# Patient Record
Sex: Female | Born: 1955 | Race: White | Hispanic: No | Marital: Married | Smoking: Current every day smoker
Health system: Southern US, Community
[De-identification: ages and names within clinical notes are randomized; demographics above are authoritative.]

## PROBLEM LIST (undated history)

## (undated) DIAGNOSIS — R0989 Other specified symptoms and signs involving the circulatory and respiratory systems: Secondary | ICD-10-CM

## (undated) DIAGNOSIS — J189 Pneumonia, unspecified organism: Secondary | ICD-10-CM

## (undated) DIAGNOSIS — R519 Headache, unspecified: Secondary | ICD-10-CM

## (undated) DIAGNOSIS — G43909 Migraine, unspecified, not intractable, without status migrainosus: Secondary | ICD-10-CM

## (undated) DIAGNOSIS — I779 Disorder of arteries and arterioles, unspecified: Secondary | ICD-10-CM

## (undated) DIAGNOSIS — R51 Headache: Secondary | ICD-10-CM

## (undated) DIAGNOSIS — I739 Peripheral vascular disease, unspecified: Secondary | ICD-10-CM

## (undated) DIAGNOSIS — I639 Cerebral infarction, unspecified: Secondary | ICD-10-CM

## (undated) DIAGNOSIS — M5030 Other cervical disc degeneration, unspecified cervical region: Secondary | ICD-10-CM

## (undated) DIAGNOSIS — R42 Dizziness and giddiness: Secondary | ICD-10-CM

## (undated) DIAGNOSIS — I10 Essential (primary) hypertension: Secondary | ICD-10-CM

## (undated) DIAGNOSIS — I259 Chronic ischemic heart disease, unspecified: Secondary | ICD-10-CM

## (undated) DIAGNOSIS — E785 Hyperlipidemia, unspecified: Secondary | ICD-10-CM

## (undated) DIAGNOSIS — I2510 Atherosclerotic heart disease of native coronary artery without angina pectoris: Secondary | ICD-10-CM

## (undated) DIAGNOSIS — J40 Bronchitis, not specified as acute or chronic: Secondary | ICD-10-CM

## (undated) HISTORY — DX: Essential (primary) hypertension: I10

## (undated) HISTORY — DX: Hyperlipidemia, unspecified: E78.5

## (undated) HISTORY — DX: Atherosclerotic heart disease of native coronary artery without angina pectoris: I25.10

## (undated) HISTORY — DX: Headache, unspecified: R51.9

## (undated) HISTORY — DX: Peripheral vascular disease, unspecified: I73.9

## (undated) HISTORY — DX: Pneumonia, unspecified organism: J18.9

## (undated) HISTORY — DX: Dizziness and giddiness: R42

## (undated) HISTORY — DX: Chronic ischemic heart disease, unspecified: I25.9

## (undated) HISTORY — DX: Cerebral infarction, unspecified: I63.9

## (undated) HISTORY — DX: Disorder of arteries and arterioles, unspecified: I77.9

## (undated) HISTORY — DX: Bronchitis, not specified as acute or chronic: J40

## (undated) HISTORY — DX: Other specified symptoms and signs involving the circulatory and respiratory systems: R09.89

## (undated) HISTORY — DX: Headache: R51

## (undated) HISTORY — DX: Other cervical disc degeneration, unspecified cervical region: M50.30

---

## 1999-08-18 ENCOUNTER — Encounter: Admission: RE | Admit: 1999-08-18 | Discharge: 1999-11-16 | Payer: Self-pay | Admitting: Family Medicine

## 2000-09-02 HISTORY — PX: CATARACT EXTRACTION: SUR2

## 2001-01-02 DIAGNOSIS — I779 Disorder of arteries and arterioles, unspecified: Secondary | ICD-10-CM

## 2001-01-02 HISTORY — DX: Disorder of arteries and arterioles, unspecified: I77.9

## 2001-01-02 HISTORY — PX: PERIPHERAL VASCULAR CATHETERIZATION: SHX172C

## 2001-01-24 ENCOUNTER — Ambulatory Visit: Admission: RE | Admit: 2001-01-24 | Discharge: 2001-01-24 | Payer: Self-pay | Admitting: Vascular Surgery

## 2001-01-24 ENCOUNTER — Encounter: Payer: Self-pay | Admitting: Vascular Surgery

## 2001-02-02 HISTORY — PX: AORTO-FEMORAL BYPASS GRAFT: SHX885

## 2001-02-10 ENCOUNTER — Inpatient Hospital Stay (HOSPITAL_COMMUNITY): Admission: RE | Admit: 2001-02-10 | Discharge: 2001-02-15 | Payer: Self-pay | Admitting: Vascular Surgery

## 2001-02-10 ENCOUNTER — Encounter (INDEPENDENT_AMBULATORY_CARE_PROVIDER_SITE_OTHER): Payer: Self-pay | Admitting: *Deleted

## 2001-02-10 ENCOUNTER — Encounter: Payer: Self-pay | Admitting: Vascular Surgery

## 2001-02-11 ENCOUNTER — Encounter: Payer: Self-pay | Admitting: Vascular Surgery

## 2001-04-04 DIAGNOSIS — I2510 Atherosclerotic heart disease of native coronary artery without angina pectoris: Secondary | ICD-10-CM

## 2001-04-04 HISTORY — DX: Atherosclerotic heart disease of native coronary artery without angina pectoris: I25.10

## 2001-04-28 ENCOUNTER — Ambulatory Visit (HOSPITAL_COMMUNITY): Admission: RE | Admit: 2001-04-28 | Discharge: 2001-04-28 | Payer: Self-pay | Admitting: Cardiovascular Disease

## 2001-04-28 HISTORY — PX: CARDIAC CATHETERIZATION: SHX172

## 2001-04-28 HISTORY — PX: LEFT HEART CATH AND CORONARY ANGIOGRAPHY: CATH118249

## 2001-05-02 ENCOUNTER — Encounter: Payer: Self-pay | Admitting: Thoracic Surgery (Cardiothoracic Vascular Surgery)

## 2001-05-03 ENCOUNTER — Encounter: Payer: Self-pay | Admitting: Thoracic Surgery (Cardiothoracic Vascular Surgery)

## 2001-05-03 ENCOUNTER — Inpatient Hospital Stay (HOSPITAL_COMMUNITY)
Admission: RE | Admit: 2001-05-03 | Discharge: 2001-05-07 | Payer: Self-pay | Admitting: Thoracic Surgery (Cardiothoracic Vascular Surgery)

## 2001-05-03 HISTORY — PX: CORONARY ARTERY BYPASS GRAFT: SHX141

## 2001-05-04 ENCOUNTER — Encounter: Payer: Self-pay | Admitting: Thoracic Surgery (Cardiothoracic Vascular Surgery)

## 2001-05-05 ENCOUNTER — Encounter: Payer: Self-pay | Admitting: Thoracic Surgery (Cardiothoracic Vascular Surgery)

## 2001-07-05 HISTORY — PX: CAROTID-SUBCLAVIAN BYPASS GRAFT: SHX910

## 2001-07-11 ENCOUNTER — Encounter (HOSPITAL_COMMUNITY): Admission: RE | Admit: 2001-07-11 | Discharge: 2001-08-18 | Payer: Self-pay | Admitting: Family Medicine

## 2001-08-05 HISTORY — PX: CAROTID-SUBCLAVIAN BYPASS GRAFT: SHX910

## 2001-08-15 ENCOUNTER — Encounter: Payer: Self-pay | Admitting: Vascular Surgery

## 2001-08-16 ENCOUNTER — Encounter (INDEPENDENT_AMBULATORY_CARE_PROVIDER_SITE_OTHER): Payer: Self-pay

## 2001-08-16 ENCOUNTER — Inpatient Hospital Stay (HOSPITAL_COMMUNITY): Admission: RE | Admit: 2001-08-16 | Discharge: 2001-08-17 | Payer: Self-pay | Admitting: Vascular Surgery

## 2001-09-19 ENCOUNTER — Encounter: Payer: Self-pay | Admitting: Family Medicine

## 2001-09-19 ENCOUNTER — Ambulatory Visit (HOSPITAL_COMMUNITY): Admission: RE | Admit: 2001-09-19 | Discharge: 2001-09-19 | Payer: Self-pay | Admitting: Family Medicine

## 2004-07-16 HISTORY — PX: US ECHOCARDIOGRAPHY: HXRAD669

## 2006-11-23 HISTORY — PX: CARDIOVASCULAR STRESS TEST: SHX262

## 2008-06-10 ENCOUNTER — Ambulatory Visit: Payer: Self-pay | Admitting: Vascular Surgery

## 2010-04-21 ENCOUNTER — Ambulatory Visit: Payer: Self-pay | Admitting: Cardiology

## 2010-06-23 ENCOUNTER — Ambulatory Visit: Payer: Self-pay | Admitting: Vascular Surgery

## 2010-08-20 ENCOUNTER — Ambulatory Visit (INDEPENDENT_AMBULATORY_CARE_PROVIDER_SITE_OTHER): Payer: Medicare Other | Admitting: Cardiology

## 2010-08-20 DIAGNOSIS — I119 Hypertensive heart disease without heart failure: Secondary | ICD-10-CM

## 2010-08-20 DIAGNOSIS — E7800 Pure hypercholesterolemia, unspecified: Secondary | ICD-10-CM

## 2010-08-20 DIAGNOSIS — E119 Type 2 diabetes mellitus without complications: Secondary | ICD-10-CM

## 2010-11-17 NOTE — Procedures (Signed)
CAROTID DUPLEX EXAM   INDICATION:  Follow-up carotid artery disease.   HISTORY:  Diabetes:  No.  Cardiac:  CABG.  Hypertension:  Yes.  Smoking:  Previous.  Previous Surgery:  Left carotid-to-subclavian artery anastomosis with  subclavian artery endarterectomy on 08/16/2001, AFBG in 2002.  CV History:  Asymptomatic.  Amaurosis Fugax No, Paresthesias No, Hemiparesis No.                                       RIGHT             LEFT  Brachial systolic pressure:         142               148  Brachial Doppler waveforms:         Normal            Normal  Vertebral direction of flow:        Antegrade         Antegrade  DUPLEX VELOCITIES (cm/sec)  CCA peak systolic                   103               116  ECA peak systolic                   151               125  ICA peak systolic                   212               67  ICA end diastolic                   71                27  PLAQUE MORPHOLOGY:                  Mixed             Mixed  PLAQUE AMOUNT:                      Moderate          Mild  PLAQUE LOCATION:                    ICA               ICA/CCA   IMPRESSION:  1. 60-79% stenosis of the right internal carotid artery.  2. 1-39% stenosis of the left internal carotid artery.  3. Patent left common carotid to subclavian artery anastomosis site      with velocity of 214 cm/s noted.   No significant change noted when compared to the previous examination on  05/03/2006.   ___________________________________________  Quita Skye. Hart Rochester, M.D.   CH/MEDQ  D:  06/10/2008  T:  06/10/2008  Job:  [id number]

## 2010-11-17 NOTE — Consult Note (Signed)
NEW PATIENT CONSULTATION   Danielle Farrell, Danielle Farrell  DOB:  Nov 06, 1955                                       06/23/2010  OZHYQ#:[id number]   Patient is a 55 year old female with a previous history of diffuse  vascular disease.  She underwent a left carotid subclavian anastomosis  by me in 2003 for left arm claudication and has undergone aortobifemoral  bypass grafting by me for lower extremity claudication.  Today she  presents with some presyncopal-type spells which always occur when she  arises quickly.  In the past this has been due to her lisinopril, and  the dose has been cut in half.  She denies any hemispheric or  nonhemispheric TIAs, amaurosis fugax, diplopia, blurred vision, or true  syncope.  She does not ambulate long distances but does not have  claudication symptoms.   CHRONIC MEDICAL PROBLEMS:  1. Type 2 diabetes mellitus.  2. Hypertension.  3. Hyperlipidemia.  4. Coronary artery disease, previous coronary bypass grafting in 2002.  5. Negative for COPD or stroke.   SOCIAL HISTORY:  She is married, does not have children.  Quit smoking  in 2003.  Does not use alcohol.   FAMILY HISTORY:  Positive for coronary artery disease in her brother who  had a myocardial infarction at age 60.  Positive for diabetes in  grandparents.  Negative for stroke.   REVIEW OF SYSTEMS:  Positive for the dizziness, presyncopal-type spells.  Occasionally has some right posterior shoulder discomfort.  She has  memory problems at times.  Denies any other symptoms in the complete  review of systems.   PHYSICAL EXAMINATION:  Blood pressure 115/69 in both upper extremities,  heart rate 54, respirations 18.  General:  She is a well-developed and  well-nourished female in no apparent distress, alert and oriented x3.  HEENT:  Normal for age.  EOMs intact.  Lungs:  Clear to auscultation.  No rhonchi or wheezing.  Cardiovascular:  Regular rhythm.  No murmurs.  Carotid pulses are 3+.   There is a soft bruit on the right.  Abdomen:  Soft, nontender with no masses.  Musculoskeletal:  Free of major  deformities.  Neurologic:  Normal.  Skin:  Free of rashes.  Lower  extremity exam reveals 3+ femoral, 2+ popliteal, and 2+ posterior tibial  pulses bilaterally.   Today I ordered a carotid duplex exam which I have reviewed and  interpreted.  She has had some mild progression of her disease in the  right internal carotid, which now approximates 70% in severity.  Her  left carotid subclavian anastomosis was widely patent.   She remains asymptomatic but does have a moderate stenosis in her right  internal carotid, which we will continue to follow closely.  If she has  any specific neurologic symptoms, she will be in touch with [place],  otherwise we will see her in 6 months for follow-up carotid duplex exam.     Quita Skye. Hart Rochester, M.D.  Electronically Signed   JDL/MEDQ  D:  06/23/2010  T:  06/23/2010  Job:  [id number]   cc:   [name], M.D.  Cassell Clement, M.D.

## 2010-11-17 NOTE — Procedures (Signed)
CAROTID DUPLEX EXAM   INDICATION:  Followup carotid disease.   HISTORY:  Left subclavian transposed to left CCA in 2003.  Diabetes:  No.  Cardiac:  CABG 2002.  Hypertension:  Yes.  Smoking:  Previous.  Previous Surgery:  Aortobifemoral graft 2002.  CV History:  Amaurosis Fugax No, Paresthesias No, Hemiparesis No                                       RIGHT             LEFT  Brachial systolic pressure:         150               144  Brachial Doppler waveforms:         WNL               WNL  Vertebral direction of flow:        Antegrade         Antegrade  DUPLEX VELOCITIES (cm/sec)  CCA peak systolic                   97                137  ECA peak systolic                   182               95  ICA peak systolic                   150               178  ICA end diastolic                   82                34  PLAQUE MORPHOLOGY:                  Heterogeneous     Heterogeneous  PLAQUE AMOUNT:                      Moderate          Mild  PLAQUE LOCATION:                    CCA/ICA           CCA/ICA   IMPRESSION:  1. Stable 60%-79% right internal carotid artery stenosis.  2. Patent anastomosis of transposed left subclavian artery with      elevated velocities in proximal common carotid artery.  3. 1%-39% left internal carotid artery plaquing.  4. Antegrade bilaterally vertebral arteries.  5. Mild plaquing of the mid left common carotid artery.  6. No change from previous report.   ___________________________________________  Quita Skye. Hart Rochester, M.D.   LT/MEDQ  D:  06/23/2010  T:  06/23/2010  Job:  [id number]

## 2010-11-20 NOTE — Op Note (Signed)
[place]. [facility]  Patient:    Danielle Farrell, Danielle Farrell                         MRN: [id number] Proc. Date: 02/10/01 Attending:  Quita Skye. Hart Rochester, M.D.                           Operative Report  PREOPERATIVE DIAGNOSIS:  Severe aortoiliac occlusive disease with limiting claudication and renal vascular hypertension with severe left renal artery stenosis.  POSTOPERATIVE DIAGNOSIS:  Severe aortoiliac occlusive disease with limiting claudication and renal vascular hypertension with severe left renal artery stenosis.  OPERATION: 1. Aortobifemoral bypass grafting using a 12 x 7 mm Hemashield Dacron graft. 2. Aorta to left renal artery bypass (end-to-side) using a 6 mm Hemashield    dacron graft.  SURGEON:  Dr. Hart Rochester.  FIRST ASSISTANT:  Dr. Cari Caraway.  SECOND ASSISTANT:  Maxwell Marion, RNFA  ANESTHESIA:  General endotracheal.  DESCRIPTION OF PROCEDURE:  The patient was taken to the operating room, placed in a supine position, at which time satisfactory general endotracheal anesthesia was administered.  Abdomen and groins were prepped with Betadine scrubbing solution, draped in routine sterile manner.  Longitudinal incisions were made in both inguinal regions, carried down through subcutaneous tissue, and the common, superficial, and profundus femoris arteries were dissected free and circled with vessiloops.  The right common femoral artery had a 2 to 3+ pulse, left femoral artery had an absent pulse.  There was posterior plaquing in the external iliac arteries beneath the inguinal ligaments.  A midline incision was made from xiphoid to just below the umbilicus, carried down through the subcutaneous tissue and linea alba and bovied.  The peritoneal cavity was entered and thoroughly explored.  The stomach, duodenum, small bowel, and colon were unremarkable.  The liver was smooth with no palpable masses.  Gallbladder appeared normal.  No stones were palpable.   The uterus and adnexa appeared normal.  Transverse colon was elevated, intestines were reflected to the right side, retroperitoneum incised exposing the aorta from the renal arteries to the bifurcation.  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  Circumferential control of the aorta was obtained.  The left renal artery was dissected free out to its primary and secondary branches for a severe atherosclerotic lesion in its mid portion.  Retroperitoneal tunnels were created posterior to the ureters.  The patient was given 25 g of Mannitol, and then heparinized.  The aorta was occluded, therefore, the renal artery was transected.  The distal stump oversewn with two layers of 3-0 Prolene.  The proximal aorta did not require an endarterectomy, although it was of small caliber.  A 12 x 7 mm Hemashield dacron graft was anastomosed end-to-end using continuous 3-0 Prolene, buttressing this with a strip of felt.  There were no leaks present.  Limbs were delivered through the tunnel, and end-to-side anastomoses were done to the common femoral arteries bilaterally with 5-0 Prolene after beveling the grafts appropriately.  Right leg was opened initially followed by the left leg with no significant hypotension.  Attention was returned to the abdomen where the left renal artery was re-exposed, left renal vein having been mobilized for exposure. The left renal artery was occluded out into the primary branches with vascular clamps.  A longitudinal opening  made in the distal renal artery and extended out the superior branch, which was the larger of the two, where there was no evidence of atherosclerosis.  There was very sluggish flow coming from the proximal renal artery.  A 6 mm Dacron graft spatulated appropriately and anastomosed end-to-side using continuous  6-0 Prolene.  Clamps were then released after about 25 minutes of ischemia time.  The bypass graft was then anastomosed to the anterior aspect of the aortic graft after opening it with an 11 blade, enlarging it with a 5 mm punch, and this was done with 5-0 Prolene in an end-to-side fashion.  When this was completed, the aortic graft reopened with no significant hypotension, an excellent flow in all vessels, and good Doppler flow in the renal artery.  Prolene was given to reverse the heparin.  Following adequate hemostasis, the inguinal incision was closed with Vicryl in a subcuticular fashion with Steri-Strips.  The retroperitoneum closed with 3-0 chromic.  Linea alba with #1 Prolene.  Skin in the midline with clips.  Sterile dressing applied, and the patient was taken to the recovery room in satisfactory condition.  She received no blood from the blood bank, was stable hemodynamically, and had an excellent urinary output. DD:  02/10/01 TD:  02/10/01 Job: [id number]

## 2010-11-20 NOTE — H&P (Signed)
[place]. [facility]  Patient:    Danielle Farrell, Danielle Farrell Visit Number: [id number] MRN: [id number]          Service Type: SUR Location: 2000 2015 01 Attending Physician:  Tressie Stalker Dictated by:   Loura Pardon, P.A. Admit Date:  05/03/2001 Discharge Date: 05/07/2001   CC:         Ricka Burdock, M.D.  Thomas A. Patty Sermons, M.D. Midge Aver H. Cornelius Moras, M.D.   History and Physical  HISTORY OF PRESENT ILLNESS:  Miss Witkop is 55 year old female referred by Dr. Jeannetta Nap for evaluation of left upper extremity claudication. She has the feeling that her left arm is numb, "a dead weight" with exertion such as making a bed or shaking out a rug. 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 She presents for a carotid subclavian surgery on August 16, 2001, by Dr. Hart Rochester. She denies any prior history of dizziness, vertigo, seizures, or tingling in the right upper extremity. She does complain of muscle weakness in the left arm, tingling and numbness in the left arm. She does not have any memory loss or confusion.  PAST MEDICAL HISTORY: 1. 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 2. Left subclavian stent. 3. History of claudication secondary to aortoiliac occlusive disease. 4. Hypercholesterolemia. 5. Hypertriglyceridemia. 6. Type 2 diabetes diagnosed December 2000, diet controlled. She stopped drinking two liters of Dr. Reino Kent a day.  PAST SURGICAL HISTORY: 1. Aortobifemoral bypass with left renal artery bypass, Dr. Josephina Gip August 2002. 2. Status post coronary artery bypass, last surgery October 2002. 3. Status post left cataract removal. 4. Status post wisdom teeth removal x four. 5. Status post herniorrhaphy.  MEDICATIONS: 1.  Ecotrin 325 mg daily. 2. Lipitor 80 mg at bedtime. 3. Paxil 20 mg daily. 4. Lopressor 50 mg 1/2 tab in the morning, 1/2 tab in the evening. 5. Unisom 1 tablet at bedtime for insomnia. 6. Multi vitamin alternating with iron tablets every other day.  ALLERGIES:  Nausea with Erythromycin and with Darvocet.  SOCIAL HISTORY:  She is married for the last 28 years. Has no children. 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 She does not drink alcoholic beverages. She quit smoking in October 2002 but before quitting she smoked one pack per day for 30 years.  FAMILY HISTORY:  Mother died at age 109 of cancer. Father died at age 82 of cancer. She has a brother who had a myocardial infarction in December of 2000.   PHYSICAL EXAMINATION:  GENERAL APPEARANCE:  This is an alert, oriented, cheerful white female in no acute distress. She does favor her left upper extremity in motion.  VITAL SIGNS:  Blood pressure on the right upper extremity is 152/88, pulse on the right is 56, uneven, respiration rate is 20. The left upper extremity there is no palpable radial or ulnar pulses. HEENT:  She is normocephalic. She has native dentitions in excellent repair. Eyes:  Pupils are equal, round, reactive to light. Extraocular movements are intact. She does have cataract of the left eye which has been removed. No history of glaucoma or macular degeneration.  NECK:  Supple. There is no jugular venous distention. No carotid bruits are auscultated.  CHEST:  Symmetrical on inspiration.  LUNGS:  Without  wheezes, rhonchi, or rales.  CARDIAC:  Regular rate and rhythm without rubs or gallops.  ABDOMEN:  Soft, nondistended. Bowel sounds are present throughout. There are no masses palpated. No bruits auscultated.  EXTREMITIES:  Without clubbing or cyanosis. No peripheral edema noted. Left upper extremity is cool. Once again, absent radial and left ulnar pulses. She has palpable pedal pulses  bilaterally.  NEUROLOGIC:  Nonfocal. Gait is steady. Patellar reflexes 2+ bilaterally.  IMPRESSION:  Left subclavian artery, total occlusion, with steel, retrograde flow in the left vertebral artery.  PLAN:  Left carotid to subclavian bypass, Dr. Josephina Gip, June 15, 2002. Dictated by:   Loura Pardon, P.A. Attending Physician:  Tressie Stalker DD:  08/15/01 TD:  08/15/01 Job: [id number]

## 2010-11-20 NOTE — Cardiovascular Report (Signed)
[place]. [facility]  Patient:    Danielle Farrell, Danielle Farrell Visit Number: [id number] MRN: [id number]          Service Type: CAT Location: [facility] 2899 11 Attending Physician:  Koren Bound Dictated by:   Alvia Grove., M.D. Proc. Date: 04/28/01 Admit Date:  04/28/2001 Discharge Date: 04/28/2001   CC:         Cardiac Catheterization Laboratory  [name], M.D.  Alfredia Ferguson, M.D.  Thomas A. Patty Sermons, M.D.  Quita Skye Hart Rochester, M.D.   Cardiac Catheterization  INDICATIONS: The patient is a 55 year old with a history of severe peripheral vascular disease, angina, and an abnormal Cardiolite study revealing significant anterior wall ischemia. She presents for heart catheterization for further evaluation. She has been having worsening angina symptoms for the past 3 or 4 weeks. These symptoms are significantly controlled with nitroglycerin although she has angina with very little exertion.  PROCEDURES: Left heart catheterization with coronary angiography.  DESCRIPTION OF PROCEDURE: The right femoral artery was easily cannulated using the modified Seldinger technique.  HEMODYNAMICS: The left ventricular pressure is 131/12, the aortic pressure is 131/73.  ANGIOGRAPHY: The proximal left main is large and normal. The distal left main tapers to approximately a 50-60% stenosis. This stenosis extends into the LAD and the left circumflex artery.  The left anterior descending artery has a significant eccentric 80-90% stenosis in the very most proximal segment/origin. This segment was not originally seen on original angiograms, but later angiograms revealed the stenosis fairly well.  There is a mid 50-60% stenosis followed by a 30% stenosis. The remainder of the LAD has minor luminal irregularities.  The left circumflex artery has a 50% stenosis at its origin. The stenosis is part of the left main stenosis. The remainder of the left circumflex  artery supplies a first and second obtuse marginal distribution.  The right coronary artery is a dominant vessel. There is a proximal stenosis of approximately 50%. There was some damping of the 6 [place] catheter. This stenosis appears to be somewhat fusiform and is relatively smooth.  There are minor luminal irregularities in the distal aspect of the right coronary artery particularly in the posterior descending artery. These stenosis are on only 10-20% in severity. The posterolateral segment artery is unremarkable.  LEFT VENTRICULOGRAM: The left ventriculogram was performed in a 30 RAO position. It revealed normal left ventricular systolic function. There is no mitral regurgitation.  We originally setup for PTCA. The left main was engaged using a 7 [place] catheter. When we performed the routine angiograms, it was then that we found the very tight proximal LAD stenosis. We gave intracoronary nitroglycerin and the stenosis only improved slightly. It appears that this stenosis is quite eccentric and is really seen in its most severe form at specific angles. At this point, we aborted the angioplasty and have decided to refer the patient for coronary artery bypass grafting.  COMPLICATIONS: None.  CONCLUSIONS: Significant coronary artery disease involving the left main, left anterior descending, and to a moderate degree the left circumflex artery. She has mild to moderate irregularities in the right coronary artery.  It is not clear whether the right coronary lesion is obstructive. It does appear to be 50% in severity although it is relatively smooth. We will review the case with Dr. Laneta Simmers and will decide whether or not to graft the right coronary artery. She has normal left ventricular systolic function. Dictated by:   Alvia Grove., M.D. Attending Physician:  Nahser, Daune Perch DD:  04/28/01 TD:  05/01/01 Job: [id number]

## 2010-11-20 NOTE — H&P (Signed)
[place]. [facility]  Patient:    Danielle Farrell, Danielle Farrell                          MRN: [id number] Adm. Date:  [id number] Attending:  Colvin Caroli Dictator:   Durenda Age, P.A.-C. CC:         Buren Kos, M.D.   History and Physical  DATE OF BIRTH:  1955/08/13  CHIEF COMPLAINT:  AIOD, renal artery stenosis.  HISTORY OF PRESENT ILLNESS:  Ms. Guilfoil is a pleasant 55 year old white female referred by Dr. Buren Kos regarding her AIOD.  The patient began experiencing lower extremity claudication with left hip joint discomfort causing the leg to "get heavy" after walking 100 feet or about 5 to 10 minutes, causing her to stop.  In the right leg, she has no claudication symptoms.  Lower extremity Dopplers on January 17, 2001 revealed a left ABI of 0.56% and a right ABI of 96%, consistent with left iliac occlusive disease. 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  Based on the findings, as well as the symptoms, Dr. Quita Skye. Hart Rochester recommended to proceed with left renal artery bypass graft, aortofemoral bypass graft, which are scheduled for February 10, 2001.  She experiences mild left buttock pain, as well as hip and thigh pain, as above.  No calf pain.  The pain is not present at rest or wakes the patient up at night.  No visible ulcers or gangrenous changes.  No ischemic changes.  No peripheral edema.  No decrease in temperature.  She does experience some shortness of breath on exertion, after walking one block, but no shortness of breath at rest.  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  PAST SURGICAL HISTORY:  Status post angiogram, January 26, 2001; status post cataract surgery, 2002; status post herniorrhaphy in 1992.  MEDICATIONS: 1. Enalapril 10 mg q.d. 2. Lipitor 20 mg q.d. 3. Paxil 20 mg q.d.  ALLERGIES:  ZANTAC VERSUS XANAX -- the patient cannot recall which one was the cause of angioedema, for they were taken at the same time; ERYTHROMYCIN.  REVIEW OF SYSTEMS:  See HPI and past medical history for significant positives.  No kidney disease or asthma; however, on August 1st, the patient experienced one episode of substernal chest pain accompanied by palpitations and diaphoresis after a stressful situation she experienced.  This had no recurrence.  Moreover, she also has experienced mild dizziness, left upper extremity weakness, occasional numbness and tingling in the left hand, and bilateral eye blurriness with no syncope or presyncope.  PAST FAMILY MEDICAL HISTORY:  Mother died at 35 of uterine cancer; history of heart disease.  Father died at 73 of prostate cancer; history of heart disease.  One brother died of MI at age 78 while driving in the year 5621.  SOCIAL HISTORY:  Widowed.  No children.  She is a Psychologist, forensic.  She smokes 1 pack a day of cigarettes for the last 30 years, although she has plans to quit on February 10, 2001.  She drinks two beers a day.  PHYSICAL EXAMINATION:  GENERAL:  Well-developed, well-nourished 55 year old white female in no acute distress, alert and oriented x 3, somewhat anxious.  VITAL SIGNS:  Blood pressure 120/60 on the right; on the left, her blood pressure was not attainable.  Pulse 68.  Respirations 18.  HEENT:  Normocephalic, atraumatic.  PERRLA.  EOMI.  Left cataract.  NECK:  Supple.  No JVD.  Right bruit.  No lymphadenopathy.  CHEST:  Symmetrical on inspiration.  LUNGS:  No wheezes.  Trace of rhonchi in the left lower lobe.  No  rales or lymphadenopathy.  CARDIOVASCULAR:  Regular rate and rhythm, 1/6 systolic murmur in the right sternal border, with an occasional ectopic beat.  No rubs.  No gallops.  ABDOMEN:  Soft, nontender.  Bowel sounds x 4.  No masses.  There is a bruit towards the left upper quadrant.  GU:  Deferred.  RECTAL:  Deferred.  EXTREMITIES:  There is mild clubbing in all her fingers.  No cyanosis or edema.  No ulcerations.  Warm temperature.  PERIPHERAL PULSES:  Carotids 2+ bilaterally.  Femorals:  Cannot palpate the left femoral; on the right, 2+ with a small bruit.  Popliteal, dorsalis pedis and posterior tibialis nonpalpable on the left; on the right, 2+.  NEUROLOGIC:  Nonfocal.  Gait steady.  DTRs 2+ bilaterally.  Muscle strength 5/5.  ASSESSMENT AND PLAN:  Aortoiliac occlusive disease -- renal artery stenosis, for aortofemoral vein graft, left renal artery bypass graft on February 10, 2001 by Dr. Hart Rochester.  Dr. Hart Rochester has seen and evaluated this patient prior to the admission and has explained the risks and benefits involved in the procedure and the patient has agreed to continue. DD:  02/08/01 TD:  02/08/01 Job: [id number]

## 2010-11-20 NOTE — Discharge Summary (Signed)
[place]. [facility]  Patient:    Danielle Farrell, Danielle Farrell                          MRN: [id number] Adm. Date:  [id number] Disc. Date: 02/15/01 Attending:  Colvin Caroli CC:         Dr. Jeannetta Nap   Discharge Summary  DATE OF BIRTH:  1956/04/06  ADDENDUM:  Please note the above-listed medications included her preadmission verapamil, Lipitor, and Paxil, along with Niferex, Colace, and Tylox.  Upon reviewing her medicines, she was started on Lopressor postoperatively, and her blood pressure is well controlled and is currently in the range of 100 to as high as 150 systolic, with a diastolic pressure in the 70-80 range.  She has not been started back on the verapamil and, therefore, we are going to send her home on the Lopressor 50 mg 1/2 tablet q.12h. and will allow Dr. Jeannetta Nap to add or revise that at his discretion at a later time.  In addition to the Lopressor, we are going to send her home on a multivitamin with iron 1 daily, aspirin 325 mg 1 q.d., and we recommend her to discontinue smoking. DD:  02/15/01 TD:  02/15/01 Job: [id number]

## 2010-11-20 NOTE — Discharge Summary (Signed)
[place]. [facility]  Patient:    Danielle Farrell, Danielle Farrell                          MRN: [id number] Adm. Date:  [id number] Disc. Date: 02/15/01 Attending:  Colvin Caroli Dictator:   Dominica Severin, P.A. CC:         Buren Kos, M.D.   Discharge Summary  DATE OF BIRTH:  02/20/1956  PRIMARY DIAGNOSIS:  Aortoiliac occlusive disease and renal artery stenosis.  SECONDARY DIAGNOSES/PAST MEDICAL HISTORY: 1. Peripheral vascular occlusive disease. 2. Non-insulin-dependent diabetes mellitus which is diet controlled. 3. History of tobacco abuse. 4. Hypertension. 5. Moderate right internal carotid artery stenosis. 6. Left subclavian steal with left arm claudication. 7. Anxiety. 8. Hyperlipidemia. 9. Recent history of upper respiratory tract infection.  NEW DIAGNOSES/DISCHARGE DIAGNOSES:  Status post aortobifemoral bypass and aorta to left renal artery bypass.  PROCEDURES:  Aortobifemoral bypass with a 12 x 7 mm Hemashield graft and aorta to left renal artery bypass with 6 mm Hemasheild graft done on February 10, 2001, by Quita Skye. Hart Rochester, M.D.  HISTORY OF PRESENT ILLNESS:  This patient is a 55 year old white female who was referred by Buren Kos, M.D., regarding aortoiliac occlusive disease.  The patient had began experiencing lower extremity claudication symptoms with left hip joint discomfort causing pain with walking 100 feet or 5-10 minutes.  The patient was referred to Quita Skye. Hart Rochester, M.D., for evaluation.  It was found that the patient had aortoiliac occlusive disease and left renal artery stenosis.  It was then determined that the patient would benefit from revascularization.  HOSPITAL COURSE:  The patient then underwent the procedure as stated above on February 10, 2001.  She tolerated the procedure well.  Her postoperative course was essentially uneventful.  She did not have any cardiac, respiratory, or GI complications.  Her diet was advanced  appropriately and steadily without complications.  She was restarted on her p.o. medications and tolerated that well.  She continued to have well-perfused lower extremities.  She also had stable blood counts.  She is to be discharged on iron to prevent anemia.  She tolerated ambulation and she is anticipated for discharge the morning of February 15, 2001.  DISCHARGE CONDITION:  Stable.  DISCHARGE MEDICATIONS: 1. Enalapril 10 mg p.o. daily. 2. Lipitor 20 mg p.o. daily. 3. Paxil 20 mg daily. 4. Tylox one to two tablets every four hours as needed for pain. 5. Niferex 150 mg daily. 6. Colace 100 mg tablets two tablets p.o. b.i.d. and hold for loose stools.  ACTIVITY:  The patient was instructed not to do any driving, to avoid heavy lifting or any strenuous activity, to continue walk daily, and continue breathing exercises.  DIET:  She is to continue a heart healthy diabetic diet.  WOUND CARE:  She is told she could shower.  Notify the office for any increased temperature greater than 101 degrees Fahrenheit or if there is any increased redness, swelling, or drainage from her incisions.  FOLLOW-UP:  She is instructed to follow up with Buren Kos, M.D., as directed.  She has a follow-up appointment for staple removal on Tuesday, February 21, 2001, at 9 a.m.  She is to make a follow-up appointment at that time to see Quita Skye. Hart Rochester, M.D., with ankle brachial index in approximately two weeks. DD:  02/14/01 TD:  02/14/01 Job: [id number]

## 2010-11-20 NOTE — Op Note (Signed)
[place]. [facility]  Patient:    Danielle Farrell, Danielle Farrell Visit Number: [id number] MRN: [id number]          Service Type: SUR Location: 3300 [id number] [id number] Attending Physician:  Colvin Caroli Dictated by:   Quita Skye Hart Rochester, M.D. Proc. Date: 08/16/01 Admit Date:  08/16/2001 Discharge Date: 08/17/2001                             Operative Report  PREOPERATIVE DIAGNOSIS:  Left subclavian occlusion with left arm claudication.  POSTOPERATIVE DIAGNOSIS:  Left subclavian occlusion with left arm claudication.  OPERATION:  Left carotid-to-subclavian anastomosis with subclavian endarterectomy.  SURGEON:  Quita Skye. Hart Rochester, M.D.  FIRST ASSISTANT:  Sherrie George, P.A.  ANESTHESIA:  General endotracheal.  BRIEF HISTORY:  This patient presented with symptoms of weakness and numbness in the left upper extremity with mild exertion and was found to have total occlusion of her proximal left subclavian artery with retrograde filling down the vertebral artery.  She had a 50-mm gradient from left to the right and was scheduled for a left carotid subclavian anastomosis.  DESCRIPTION OF PROCEDURE:  Patient was taken to the operating room and placed in a supine position, at which time satisfactory general endotracheal anesthesia was administered.  The left neck was prepped with Betadine scrubbing solution and draped in usual sterile manner.  A supraclavicular incision was made and carried down through subcutaneous tissue and platysma using the Bovie.  The sternal and a portion of the clavicular head of the sternocleidomastoid were divided, exposing the carotid sheath.  The common carotid artery was dissected free, reflected medially and the internal jugular vein and vagus nerves reflected laterally.  The thoracic duct was ligated with 3-0 silk ties and divided, exposing the vertebral artery, which was traced down to the subclavian artery, which was encircled proximally with  umbilical tape.  Subclavian artery was dissected out distally, ligating a few small branches with 3-0 silk ties and dividing them for mobilization.  There was total occlusion of the proximal subclavian artery up to a point about 1 cm proximal to the vertebral.  Patient was then heparinized, vessels occluded with vascular clamps and the subclavian artery transected about a centimeter and a half proximal to the vertebral.  Proximal stump was totally occluded but was oversewn with two layers of 5-0 Prolene and allowed to retract proximally. There was near total occlusion of the vessel at the area of transection, necessitating an endarterectomy of the subclavian artery and the origin of the vertebral artery, which had a plaque going up about 2 to 3 cm which feathered out nicely.  There was excellent back-bleeding from the vertebral and the distal subclavian.  A site for transposition and anastomosis of the subclavian onto the common carotid artery was identified, common carotid artery occluded proximally and distally with vascular clamps, opened with an 11 blade and enlarged with a 5-mm punch.  Subclavian artery was then anastomosed end-to-side using continuous 6-0 Prolene.  Following appropriate flushing and anastomosis completed, clamps were released and there was an excellent pulse and Doppler flow in all vessels.  Vertebral artery was positioned nicely with no kinking.  Protamine was given to reverse the heparin.  Following adequate hemostasis, wound was irrigated with saline and closed in layers with Vicryl in a subcuticular fashion, sterile dressing applied and patient taken to recovery room in satisfactory condition. Dictated by:   Quita Skye Hart Rochester, M.D.  Attending  Physician:  Colvin Caroli DD:  08/16/01 TD:  08/16/01 Job: [id number]

## 2010-11-20 NOTE — Discharge Summary (Signed)
[place]. [facility]  Patient:    Danielle Farrell, Danielle Farrell Visit Number: [id number] MRN: [id number]          Service Type: SUR Location: 2000 2015 01 Attending Physician:  Tressie Stalker Dictated by:   Tollie Pizza. Collins, P.A.-C. Admit Date:  05/03/2001 Discharge Date: 05/07/2001   CC:         Maisie Fus A. Patty Sermons, M.D.  Buren Kos, M.D.   Discharge Summary  PRIMARY ADMITTING DIAGNOSES: 1. Severe three-vessel coronary artery disease. 2. Class IV unstable angina.  ADDITIONAL DIAGNOSES: 1. Hypertension. 2. Peripheral vascular disease. 3. Diet-controlled diabetes mellitus. 4. Hypercholesterolemia. 5. Postoperative anemia.  PROCEDURES PERFORMED:  Coronary artery bypass grafting x 4 (free left internal mammary artery to the LAD, right internal mammary artery to the right coronary artery, saphenous vein graft to diagonal coronary, saphenous vein graft to circumflex coronary).  HISTORY:  The patient is a 55 year old female with a 2-3 month history of progressive angina symptoms with exertion.  Most recently, she has noted that her symptoms awaken her from sleep as well.  A stress Cardiolite was performed which showed evidence of ischemia.  She underwent cardiac catheterization by Dr. Elease Hashimoto on 04/28/01 and was found to have severe three-vessel coronary artery disease with a high-grade ostial LAD lesion.  It was felt that she would be a good candidate for surgical revascularization.  She was seen in the office by Dr. Cornelius Moras and agreed to proceed with surgery.  She was admitted on 05/03/01 and was taken to the operating room where she underwent CABG x 4 with the above-noted grafts.  She tolerated the procedure well and was extubated shortly after surgery.  She was hemodynamically stable on postop day one, although she was known to have anemia with a hemoglobin of 7.  At that time, it was elected to observe her blood counts before further treatment was initiated.   She was otherwise stable and doing well and was transferred to the floor.  Over the course of the next several days, she continued to show gradual decrease in her hemoglobin and hematocrit down to a low of 6.5 and 18, respectively.  She was transfused a unit of packed red blood cells, and this brought her hematocrit up to 28%.  She otherwise remained relatively asymptomatic.  She was also treated with iron replacement therapy.  During this admission, she has also been volume overloaded and has been diuresed with Lasix.  She has done well overall.  She has been ambulating in the halls without difficulty.  She has been weaned off supplement oxygen and has maintained O2 saturations of 91% on room air.  Her incisions are healing well. Her blood sugars have been fairly well controlled.  It is felt that she is ready for discharge home at this time.  DISCHARGE MEDICATIONS: 1. Enteric-coated aspirin 325 q.d. 2. Paxil 20 mg q.d. 3. Lipitor 20 mg q.d. 4. Colace 100 mg q.d. 5. Niferex 150 mg b.i.d. 6. Lopressor 25 mg b.i.d. 7. Lasix 40 mg q.d. x 3 days. 8. K-Dur 20 mEq q.d. x 3 days. 9. Tylox 1-2 q.4h. p.r.n. for pain.  DISCHARGE INSTRUCTIONS:  She is to continue daily walking and deep breathing exercises.  She should refrain from driving anything heavier than 10 pounds or any strenuous activity.  She is asked to shower daily and clean her incisions with soap and water.  She will also continue a low fat, low sodium diet.  DISCHARGE FOLLOW-UP:  She will follow up  with Dr. Patty Sermons in the next weeks and will have a chest x-ray at that appointment.  The she will see Dr. Cornelius Moras in three weeks and is asked to bring her chest x-ray to this appointment.  She should call our office if she has any problems or questions in the interim. Dictated by:   Tollie Pizza Collins, P.A.-C. Attending Physician:  Tressie Stalker DD:  05/30/01 TD:  05/31/01 Job: [id number]

## 2010-11-20 NOTE — Op Note (Signed)
[place]  Patient:    Danielle Farrell, Danielle Farrell Visit Number: [id number] MRN: [id number]          Service Type: SUR Location: 2000 2015 01 Attending Physician:  Tressie Stalker Dictated by:   Salvatore Decent. Cornelius Moras, M.D. Proc. Date: 05/03/01 Admit Date:  05/03/2001   CC:         Maisie Fus A. Patty Sermons, M.D.  Alvia Grove., M.D.  Hadassah Pais. Jeannetta Nap, M.D.  Quita Skye Hart Rochester, M.D.   Operative Report  PREOPERATIVE DIAGNOSIS:  Left main disease with severe three-vessel coronary artery disease and class 4 unstable angina.  POSTOPERATIVE DIAGNOSIS:  Left main disease with severe three-vessel coronary artery disease and class 4 unstable angina.  OPERATION PERFORMED:  Median sternotomy for coronary artery bypass grafting x 4 (free left internal mammary artery to distal left anterior descending coronary artery, right internal mammary artery to distal right coronary artery, saphenous vein graft to first diagonal branch, saphenous vein graft to circumflex marginal branch).  SURGEON:  Salvatore Decent. Cornelius Moras, M.D.  ASSISTANT:  Dominica Severin, P.A.  ANESTHESIA:  General.  INDICATIONS FOR PROCEDURE:  The patient is a 55 year old white female followed by Dr. [name] and Dr. Ronny Flurry and referred by Dr. Delane Ginger for management of coronary artery disease.  The patient has a history of hypertension, type 2 diet controlled diabetes, hypercholesterolemia, tobacco abuse and severe peripheral vascular disease.  She underwent aortobifemoral bypass and left renal artery bypass in August of 2002 by Dr. Quita Skye. Hart Rochester. The patient now presents with progressive symptoms of angina.  Catheterization performed by Dr. Elease Hashimoto demonstrates left main disease and severe three-vessel coronary artery disease with normal left ventricular function.  OPERATIVE CONSENT:  The patient has been counseled at length regarding the indications and potential benefits of coronary artery  bypass grafting.  She understands the associated risks of surgery including but not limited to the risks of death, stroke, myocardial infarction, bleeding requiring blood transfusion, arrhythmia, infection, and recurrent coronary artery disease. All of her questions have been addressed.  DESCRIPTION OF PROCEDURE:  The patient was brought to the operating room on the above-mentioned date and invasive hemodynamic monitoring was established by the anesthesia service under the care and direction of Dr. Sharee Holster. Intravenous antibiotics were administered.  Following induction of general endotracheal anesthesia, the patients chest, abdomen, both groins, and both lower extremities were prepared and draped in a sterile manner.  A median sternotomy incision was performed and the left internal mammary artery was dissected from the chest wall and prepared for bypass grafting. The left internal mammary artery was somewhat small caliber but otherwise good quality vessel.  Simultaneously saphenous vein was obtained from the patients right lower extremity through a series of longitudinal incisions.  Initially, a small incision was made at the right ankle.  However, the saphenous vein at this level was very small and was felt not to be suitable for grafting. Therefore, the remainder of the vein was obtained proximally from the patients right thigh.  The portions of saphenous vein utilized for conduit was felt to be good quality conduit.  However, just below the knee the vein became too small for using.  Subsequently, the right internal mammary artery was dissected from the chest wall and also prepared for bypass grafting.  The right internal mammary artery was similar caliber to the left internal mammary artery but otherwise good quality conduit.  The patient was heparinized systemically.  The pericardium was opened. The  ascending aorta was normal in appearance.  The ascending aorta and  right atrium were cannulated for cardiopulmonary bypass.  Adequate heparinization was verified.  Cardiopulmonary bypass was begun and the surface of the heart was inspected. Distal sites were selected for coronary artery bypass grafting.  Portions of saphenous vein and both internal mammary arteries were all trimmed to appropriate lengths.  The left internal mammary artery was transected at its origin from the left subclavian artery and the proximal end was doubly oversewn with suture ligatures.  This was performed because of the patients known left subclavian artery occlusion.  A temperature probe was placed in the left ventricular septum and a styrofoam pad was placed to protect the left phrenic nerve from thermal injury.  A cardioplegia catheter was placed in the ascending aorta.  The patient was cooled to 28 degrees systemic temperature.  The aortic crossclamp was applied and cardioplegia was delivered in antegrade fashion through the aortic root.  Iced saline slush was applied for topical hypothermia.  The initial cardioplegic arrest and myocardial cooling were felt to be excellent.  Additional doses of cardioplegia were administered intermittently throughout the crossclamp portion of the operation both through the aortic root and down subsequently placed vein grafts to maintain septal temperature below 15 degrees centigrade.  The following distal coronary anastomoses were performed:  (1) The first diagonal branch was grafted with a saphenous vein graft in an end-to-side fashion.  This coronary measured 1.8 mm in diameter and is of good quality.  (2) The circumflex marginal branch was grafted with a saphenous vein graft in an end-to-side fashion.  This coronary measured 1.5 mm in diameter and was of good quality.  (3) The distal right  coronary artery was grafted with the right internal mammary artery in an end-to-side fashion.  This coronary measured 2 mm in diameter and was of  good quality.  (4) The distal left anterior descending coronary artery was grafted with the left internal mammary artery in an end-to-side fashion.  This coronary artery measured 1.8 mm in diameter and was of good quality.  All three proximal anastomoses were performed directly to the ascending aorta prior to removal of the aortic crossclamp.  The proximal anastomosis of the left internal mammary artery was sewn to a short segment button of saphenous vein because of mismatch in size between the ascending aorta and the internal mammary artery itself.  The aortic crossclamp was removed after a total crossclamp time of 72 minutes.  The heart began to beat spontaneously without need for cardioversion. Epicardial pacing wires were fixed to the right ventricular outflow tract and to the right atrial appendage.  All proximal and distal anastomoses were inspected for hemostasis and appropriate graft orientation.  The patient was rewarmed to greater than 37 degrees centigrade temperature.  The patient was weaned from cardiopulmonary bypass without difficulty.  The patients rhythm at separation from bypass was normal sinus rhythm.  No inotropic support was required.  Total cardiopulmonary bypass time for the operation was 102 minutes.  The venous and arterial cannulae were removed uneventfully.  Protamine was administered to reverse the anticoagulation.  The mediastinum and both left and right pleural spaces were irrigated with saline solution containing vancomycin.  Meticulous surgical hemostasis was ascertained.  The mediastinum and the left and right pleural spaces were drained with four chest tubes placed through separate stab incisions inferiorly.  The median sternotomy was closed in a routine fashion.  The deep subcutaneous tissues of the right thigh were  drained with a 19 [place] Blake drain.  The right lower extremity incisions were closed in multiple layers in routine fashion.  All skin  incisions were closed with subcuticular skin closures.  The patient tolerated the procedure well and was transported to the surgical intensive care unit in stable condition.  There were no intraoperative complications.  All sponge, needle and instrument counts were verified correct at the completion of the operation.  No blood products were administered. Dictated by:   Salvatore Decent Cornelius Moras, M.D. Attending Physician:  Tressie Stalker DD:  05/03/01 TD:  05/04/01 Job: [id number]

## 2010-11-20 NOTE — Procedures (Signed)
[place]  Patient:    Danielle Farrell, Danielle Farrell                          MRN: [id number] Proc. Date: 01/26/01 Adm. Date:  [id number] Attending:  Colvin Caroli CC:         Dr. 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  PROCEDURES: 1. Arch aortogram. 2. Abdominal aortogram with bilateral lower extremity runoff via right common    femoral approach.  SURGEON:  Quita Skye. Hart Rochester, M.D.  ANESTHESIA:  Local Xylocaine and Versed 2 mg intravenously.  DESCRIPTION OF PROCEDURE:  The patient was taken to the [facility] peripheral endovascular lab, placed in the supine position, at which time both groins were prepped with Betadine solution, draped in the routine sterile manner. There was an absent left femoral pulse and 3+ right femoral pulse.  The right common femoral artery was entered percutaneously after infiltrating with 1% Xylocaine, guidewire passed into the suprarenal aorta under fluoroscopic guidance.  A 5 French sheath and dilator were passed over the guidewire, dilator removed, and a long pigtail catheter was then positioned in the aortic arch just above the root.  An arch aortogram was then performed using the RAO projection, injecting 30 cc of contrast at 20 cc per second.  This revealed the innominate artery to be widely patent with prompt filling of the subclavian and common carotid arteries, with some moderate narrowing at the carotid bifurcation and the internal carotid artery.  The right vertebral artery was widely patent.  The left common carotid artery arose adjacent to the innominate, possibly from a common trunk, and was widely patent with some ulcerative disease in the proximal internal carotid artery with no significant stenosis noted.  There was total occlusion of the origin of the  left subclavian artery with reconstitution via retrograde flow in the left vertebral artery, filling the distal subclavian and axillary artery normally. The catheter was then withdrawn into the suprarenal aorta at the L1-2 level and an abdominal aortogram performed injecting 20 cc of contrast at 20 cc per second.  This revealed bilateral renal artery disease with apparent fibromuscular disease in the proximal right renal artery, with no significant stenosis.  There was moderately severe stenosis in the distal left renal artery, which was fairly focal, approximating 80%, which appeared to be atherosclerotic.  The infrarenal aorta was quite small with patency of the inferior mesenteric artery.  There was plaque formation in the right common iliac artery, causing some mild narrowing, 30-40%, with otherwise patency of the right common, internal, and external iliac arteries.  The left common iliac artery was occluded at its origin, reconstituted at the level of the internal iliac artery by collaterals.  Catheter was withdrawn into the terminal aorta and bilateral lower extremity runoff performed, injecting 88 cc of contrast at 8 cc per second.  This revealed normal filling of the distal external iliac arteries bilaterally with patency of the common femoral, superficial femoral, profunda femoral, and popliteal arteries bilaterally.  On the right there was three-vessel runoff with the posterior tibial being the smallest vessel but appearing to be patent, and on the left the posterior tibial artery was quite small and appeared to be occluded with some reconstitution distally, with the peroneal and anterior tibial arteries appearing relatively normal.  Having  tolerated the procedure well, the catheter was removed over the guidewire, the sheath removed, adequate compression applied, and no complications ensued.  FINDINGS: 1. Total occlusion, left subclavian artery, with retrograde flow of left     vertebral artery filling distal subclavian. 2. Apparent moderate stenosis of right distal common, proximal internal    carotid artery. 3. An 80% left renal artery stenosis. 4. Apparent fibromuscular disease of proximal right renal artery with no    significant stenosis. 5. A small infrarenal aorta with total occlusion, left common iliac artery. 6. Normal runoff with the exception of left posterior tibial occlusion and    small right posterior tibial artery. DD:  01/26/01 TD:  01/26/01 Job: [id number]

## 2010-12-10 ENCOUNTER — Other Ambulatory Visit: Payer: Medicare Other

## 2010-12-25 ENCOUNTER — Other Ambulatory Visit: Payer: Self-pay | Admitting: Cardiology

## 2010-12-25 MED ORDER — ROSUVASTATIN CALCIUM 10 MG PO TABS: 10.0000 mg | ORAL_TABLET | Freq: Every day | ORAL | Status: DC

## 2010-12-25 NOTE — Telephone Encounter (Signed)
Refilled per patient request. 

## 2010-12-25 NOTE — Telephone Encounter (Signed)
Pt wanted refill crestor called to [facility] [place]

## 2011-01-13 ENCOUNTER — Other Ambulatory Visit: Payer: Self-pay | Admitting: *Deleted

## 2011-01-13 DIAGNOSIS — E119 Type 2 diabetes mellitus without complications: Secondary | ICD-10-CM

## 2011-01-13 DIAGNOSIS — E785 Hyperlipidemia, unspecified: Secondary | ICD-10-CM

## 2011-02-02 ENCOUNTER — Other Ambulatory Visit (INDEPENDENT_AMBULATORY_CARE_PROVIDER_SITE_OTHER): Payer: Medicare Other | Admitting: *Deleted

## 2011-02-02 DIAGNOSIS — E119 Type 2 diabetes mellitus without complications: Secondary | ICD-10-CM

## 2011-02-02 DIAGNOSIS — E785 Hyperlipidemia, unspecified: Secondary | ICD-10-CM

## 2011-02-02 LAB — BASIC METABOLIC PANEL
BUN: 14 mg/dL (ref 6–23)
CO2: 25 mEq/L (ref 19–32)
Chloride: 103 mEq/L (ref 96–112)
Glucose, Bld: 196 mg/dL — ABNORMAL HIGH (ref 70–99)
Potassium: 5.1 mEq/L (ref 3.5–5.1)
Sodium: 138 mEq/L (ref 135–145)

## 2011-02-02 LAB — LIPID PANEL
Cholesterol: 165 mg/dL (ref 0–200)
VLDL: 21 mg/dL (ref 0.0–40.0)

## 2011-02-02 LAB — HEPATIC FUNCTION PANEL
ALT: 19 U/L (ref 0–35)
AST: 20 U/L (ref 0–37)
Albumin: 4.6 g/dL (ref 3.5–5.2)
Alkaline Phosphatase: 61 U/L (ref 39–117)

## 2011-02-02 LAB — HEMOGLOBIN A1C: Hgb A1c MFr Bld: 8.2 % — ABNORMAL HIGH (ref 4.6–6.5)

## 2011-02-04 ENCOUNTER — Encounter: Payer: Self-pay | Admitting: Cardiology

## 2011-02-05 ENCOUNTER — Encounter: Payer: Self-pay | Admitting: Cardiology

## 2011-02-05 ENCOUNTER — Ambulatory Visit (INDEPENDENT_AMBULATORY_CARE_PROVIDER_SITE_OTHER): Payer: Medicare Other | Admitting: Cardiology

## 2011-02-05 ENCOUNTER — Other Ambulatory Visit (INDEPENDENT_AMBULATORY_CARE_PROVIDER_SITE_OTHER): Payer: Medicare Other

## 2011-02-05 DIAGNOSIS — Z48812 Encounter for surgical aftercare following surgery on the circulatory system: Secondary | ICD-10-CM

## 2011-02-05 DIAGNOSIS — I119 Hypertensive heart disease without heart failure: Secondary | ICD-10-CM

## 2011-02-05 DIAGNOSIS — Z9889 Other specified postprocedural states: Secondary | ICD-10-CM

## 2011-02-05 DIAGNOSIS — I6529 Occlusion and stenosis of unspecified carotid artery: Secondary | ICD-10-CM

## 2011-02-05 DIAGNOSIS — Z951 Presence of aortocoronary bypass graft: Secondary | ICD-10-CM

## 2011-02-05 DIAGNOSIS — E7800 Pure hypercholesterolemia, unspecified: Secondary | ICD-10-CM

## 2011-02-05 DIAGNOSIS — E785 Hyperlipidemia, unspecified: Secondary | ICD-10-CM | POA: Insufficient documentation

## 2011-02-05 DIAGNOSIS — E119 Type 2 diabetes mellitus without complications: Secondary | ICD-10-CM | POA: Insufficient documentation

## 2011-02-05 NOTE — Progress Notes (Signed)
Danielle Farrell Date of Birth:  11-25-1955 [facility] [address]   [address] [place], [place]  [address] [phone]           Fax   [phone]  History of Present Illness: This very pleasant 55 year old woman is seen for a scheduled 4 month followup office visit.  She has a history of known coronary artery disease.  She had coronary artery bypass graft surgery on 05/03/01.  She has a history of peripheral vascular disease.  She has a history of carotid bruits.  She is diabetic.  She has a history of hypertension and hypercholesterolemia.  She still smokes about half a pack of cigarettes a day against advice.  Since last visit she's had no chest pain or angina.  She has had more short of breath in the hot weather.  She has not been as good with her diet and has been eating more ice cream in the hot weather.  She is on Crestor for hypercholesterolemia.  She denies any symptoms of TIA or stroke.  She's not having any claudication.  She has had previous aortobifemoral bypass graft surgery.  Current Outpatient Prescriptions  Medication Sig Dispense Refill  . aspirin 81 MG EC tablet Take 81 mg by mouth daily.        . calcium carbonate (OS-CAL) 600 MG TABS Take 600 mg by mouth 2 (two) times daily with a meal.        . glipiZIDE (GLUCOTROL) 5 MG tablet Take 10 mg by mouth 2 (two) times daily before a meal.        . lisinopril (PRINIVIL,ZESTRIL) 5 MG tablet Take 5 mg by mouth daily.        . metFORMIN (GLUCOPHAGE) 500 MG tablet Take 500 mg by mouth 2 (two) times daily with a meal.        . metoprolol succinate (TOPROL-XL) 25 MG 24 hr tablet Take 25 mg by mouth daily.        . nitroGLYCERIN (NITROSTAT) 0.4 MG SL tablet Place 0.4 mg under the tongue every 5 (five) minutes as needed.        Marland Kitchen PARoxetine (PAXIL) 20 MG tablet Take 20 mg by mouth every morning.        . rosuvastatin (CRESTOR) 10 MG tablet Take 1 tablet (10 mg total) by mouth at bedtime.  30 tablet  11     Allergies  Allergen Reactions  . Lipitor (Atorvastatin Calcium)     There is no problem list on file for this patient.   History  Smoking status  . Current Everyday Smoker -- 0.5 packs/day  Smokeless tobacco  . Not on file    History  Alcohol Use No    Family History  Problem Relation Age of Onset  . Cancer Mother   . Cancer Father     Review of Systems: Constitutional: no fever chills diaphoresis or fatigue or change in weight.  Head and neck: no hearing loss, no epistaxis, no photophobia or visual disturbance. Respiratory: No cough, shortness of breath or wheezing. Cardiovascular: No chest pain peripheral edema, palpitations. Gastrointestinal: No abdominal distention, no abdominal pain, no change in bowel habits hematochezia or melena. Genitourinary: No dysuria, no frequency, no urgency, no nocturia. Musculoskeletal:No arthralgias, no back pain, no gait disturbance or myalgias. Neurological: No dizziness, no headaches, no numbness, no seizures, no syncope, no weakness, no tremors. Hematologic: No lymphadenopathy, no easy bruising. Psychiatric: No confusion, no hallucinations, no sleep disturbance.  Physical Exam: Filed Vitals:   02/05/11 0857  BP: 120/72  Pulse: 68   The general appearance feels a well-developed pleasant woman in no acute distress.Pupils equal and reactive.   Extraocular Movements are full.  There is no scleral icterus.  The mouth and pharynx are normal.  The neck is supple.  The carotids reveal Bilateral soft bruits.  The jugular venous pressure is normal.  The thyroid is not enlarged.  There is no lymphadenopathy.The chest is clear to percussion and auscultation. There are no rales or rhonchi. Expansion of the chest is symmetrical.  The precordium is quiet.  The first heart sound is normal.  The second heart sound is physiologically split.  There is no murmur gallop rub or click.  There is no abnormal lift or heave.  The abdomen is soft  and nontender. Bowel sounds are normal. The liver and spleen are not enlarged. There Are no abdominal masses. There are no bruits.  The pedal pulses are good.  There is no phlebitis or edema.  There is no cyanosis or clubbing.  Strength is normal and symmetrical in all extremities.  There is no lateralizing weakness.  There are no sensory deficits.  The skin is warm and dry.  There is no rash.      Assessment / Plan:  Continue same medication.  Recheck in 4 months for followup office visit.  We talked about the importance of quitting smoking and also about the importance of careful diet in terms of her cholesterol and her diabetes.

## 2011-02-05 NOTE — Assessment & Plan Note (Signed)
The patient remains on Crestor.  Her LDL this time is 26 which is acceptable for her.  Continue same dose

## 2011-02-10 NOTE — Procedures (Unsigned)
CAROTID DUPLEX EXAM  INDICATION:  Followup carotid artery disease.  HISTORY: Diabetes:  Yes. Cardiac:  Quadruple bypass 05/03/2001. Hypertension:  Yes. Smoking:  Yes. Previous Surgery:  Left carotid to subclavian transposition 2003, aortobifemoral graft 2002. CV History:  Currently asymptomatic. Amaurosis Fugax No, Paresthesias No, Hemiparesis No                                      RIGHT             LEFT Brachial systolic pressure:         149               156 Brachial Doppler waveforms:         Normal            Normal Vertebral direction of flow:        Antegrade         Antegrade DUPLEX VELOCITIES (cm/sec) CCA peak systolic                   69                P=150, M=118 ECA peak systolic                   98                88 ICA peak systolic                   205               81 ICA end diastolic                   68                32 PLAQUE MORPHOLOGY:                  Mixed             Calcific PLAQUE AMOUNT:                      Moderate          Mild PLAQUE LOCATION:                    CCA, ICA, ECA     CCA, ICA  IMPRESSION: 1. Right internal carotid artery velocity suggests 60%-79% stenosis. 2. Patent anastomosis of transposed left subclavian artery with     elevated velocities in the proximal common carotid artery. 3. Left internal carotid artery velocity suggests 1%-39% stenosis. 4. Antegrade vertebral arteries bilaterally.  ___________________________________________ Danielle Farrell, M.D.  EM/MEDQ  D:  02/05/2011  T:  02/05/2011  Job:  [id number]

## 2011-02-15 ENCOUNTER — Other Ambulatory Visit: Payer: Medicare Other

## 2011-02-22 ENCOUNTER — Other Ambulatory Visit: Payer: Self-pay | Admitting: Cardiology

## 2011-02-22 DIAGNOSIS — F329 Major depressive disorder, single episode, unspecified: Secondary | ICD-10-CM

## 2011-03-12 ENCOUNTER — Other Ambulatory Visit: Payer: Self-pay | Admitting: Cardiology

## 2011-03-12 DIAGNOSIS — I119 Hypertensive heart disease without heart failure: Secondary | ICD-10-CM

## 2011-03-12 DIAGNOSIS — E7800 Pure hypercholesterolemia, unspecified: Secondary | ICD-10-CM

## 2011-04-26 ENCOUNTER — Other Ambulatory Visit: Payer: Self-pay | Admitting: Cardiology

## 2011-04-26 NOTE — Telephone Encounter (Signed)
Pt wants samples of crestor please call °

## 2011-04-27 ENCOUNTER — Encounter: Payer: Self-pay | Admitting: Cardiology

## 2011-04-27 MED ORDER — ROSUVASTATIN CALCIUM 20 MG PO TABS: ORAL_TABLET | ORAL | Status: DC

## 2011-04-27 NOTE — Telephone Encounter (Signed)
error 

## 2011-04-27 NOTE — Telephone Encounter (Signed)
Refilled as requested  

## 2011-04-27 NOTE — Telephone Encounter (Signed)
Pt didn't get a call back yesterday and she completely out of medication and wants crestor 20mg  called into [facility] in [place] [phone] she cuts them in half

## 2011-06-07 ENCOUNTER — Other Ambulatory Visit (INDEPENDENT_AMBULATORY_CARE_PROVIDER_SITE_OTHER): Payer: Medicare Other | Admitting: *Deleted

## 2011-06-07 ENCOUNTER — Encounter: Payer: Self-pay | Admitting: Cardiology

## 2011-06-07 ENCOUNTER — Ambulatory Visit (INDEPENDENT_AMBULATORY_CARE_PROVIDER_SITE_OTHER): Payer: Medicare Other | Admitting: Cardiology

## 2011-06-07 ENCOUNTER — Other Ambulatory Visit: Payer: Medicare Other | Admitting: *Deleted

## 2011-06-07 VITALS — BP 130/78 | HR 60 | Ht 65.0 in | Wt 143.0 lb

## 2011-06-07 DIAGNOSIS — Z9889 Other specified postprocedural states: Secondary | ICD-10-CM

## 2011-06-07 DIAGNOSIS — Z951 Presence of aortocoronary bypass graft: Secondary | ICD-10-CM

## 2011-06-07 DIAGNOSIS — I119 Hypertensive heart disease without heart failure: Secondary | ICD-10-CM

## 2011-06-07 DIAGNOSIS — I6529 Occlusion and stenosis of unspecified carotid artery: Secondary | ICD-10-CM

## 2011-06-07 DIAGNOSIS — E119 Type 2 diabetes mellitus without complications: Secondary | ICD-10-CM

## 2011-06-07 DIAGNOSIS — E7800 Pure hypercholesterolemia, unspecified: Secondary | ICD-10-CM

## 2011-06-07 LAB — HEPATIC FUNCTION PANEL
AST: 25 U/L (ref 0–37)
Albumin: 4.6 g/dL (ref 3.5–5.2)
Alkaline Phosphatase: 63 U/L (ref 39–117)
Total Bilirubin: 0.4 mg/dL (ref 0.3–1.2)

## 2011-06-07 LAB — BASIC METABOLIC PANEL
BUN: 12 mg/dL (ref 6–23)
CO2: 21 mEq/L (ref 19–32)
Calcium: 9.9 mg/dL (ref 8.4–10.5)
Chloride: 104 mEq/L (ref 96–112)
Creatinine, Ser: 0.9 mg/dL (ref 0.4–1.2)
Glucose, Bld: 211 mg/dL — ABNORMAL HIGH (ref 70–99)

## 2011-06-07 LAB — LIPID PANEL
LDL Cholesterol: 111 mg/dL — ABNORMAL HIGH (ref 0–99)
Total CHOL/HDL Ratio: 3
Triglycerides: 68 mg/dL (ref 0.0–149.0)

## 2011-06-07 NOTE — Assessment & Plan Note (Signed)
The patient has not been having any dizziness or syncope or symptoms of congestive heart failure from her high blood pressure.

## 2011-06-07 NOTE — Assessment & Plan Note (Signed)
The patient has not been expressing any hypoglycemic episodes.  Blood work today is pending.  Her checking a hemoglobin A1c

## 2011-06-07 NOTE — Assessment & Plan Note (Signed)
The patient denies any recurrent chest pain or angina.  Her last nuclear stress test was 11/23/06 and showed no reversible ischemia and her ejection fraction was 64% at that time.

## 2011-06-07 NOTE — Patient Instructions (Signed)
Your physician recommends that you continue on your current medications as directed. Please refer to the Current Medication list given to you today. Your physician recommends that you schedule a follow-up appointment in: 4 months with fasting labs (lp/bmet/hfp/a1c/ekg)

## 2011-06-07 NOTE — Assessment & Plan Note (Signed)
Patient is on Crestor for her elevated cholesterol.  She denies any side effects.  Blood work is pending

## 2011-06-07 NOTE — Assessment & Plan Note (Signed)
Patient has bilateral carotid bruits, louder on the left.  These are followed by [facility] and Dr. Hart Rochester.  She denies any TIA symptoms.

## 2011-06-07 NOTE — Progress Notes (Signed)
[name] Date of Birth:  1956/07/04 [facility] / [facility] [address]   [address] [place], [place]  [address] [phone]           Fax   [phone]  History of Present Illness: This pleasant 55 year old woman is seen for a scheduled four-month followup office visit.  She has a complex past medical history.  She has a history of ischemic heart disease and had coronary bypass graft surgery on 05/03/01.  She has a history of peripheral vascular disease.  She has a history of carotid bruits followed by the EMS.  She is diabetic.  She has had a history of high blood pressure and high cholesterol.  She smokes 5 cigarettes a day.  Current Outpatient Prescriptions  Medication Sig Dispense Refill  . aspirin 81 MG EC tablet Take 81 mg by mouth daily.        . calcium carbonate (OS-CAL) 600 MG TABS Take 600 mg by mouth 2 (two) times daily with a meal.        . glipiZIDE (GLUCOTROL) 5 MG tablet Take 10 mg by mouth 2 (two) times daily before a meal.        . lisinopril (PRINIVIL,ZESTRIL) 5 MG tablet Take 5 mg by mouth daily.        . metFORMIN (GLUCOPHAGE) 500 MG tablet Take 500 mg by mouth 2 (two) times daily with a meal.        . metoprolol succinate (TOPROL-XL) 25 MG 24 hr tablet Take 25 mg by mouth daily.        . nitroGLYCERIN (NITROSTAT) 0.4 MG SL tablet Place 0.4 mg under the tongue every 5 (five) minutes as needed.        Marland Kitchen PARoxetine (PAXIL) 20 MG tablet TAKE 1 TABLET DAILY  90 tablet  3  . rosuvastatin (CRESTOR) 20 MG tablet 1/2 daily or as directed  30 tablet  11    Allergies  Allergen Reactions  . Lipitor (Atorvastatin Calcium)     Patient Active Problem List  Diagnoses  . Diabetes mellitus  . Benign hypertensive heart disease without heart failure  . Hx of CABG  . Carotid stenosis, asymptomatic  . Hypercholesterolemia    History  Smoking status  . Current Everyday Smoker -- 0.5 packs/day  Smokeless tobacco  . Not on file    History  Alcohol Use  No    Family History  Problem Relation Age of Onset  . Cancer Mother   . Cancer Father     Review of Systems: Constitutional: no fever chills diaphoresis or fatigue or change in weight.  Head and neck: no hearing loss, no epistaxis, no photophobia or visual disturbance. Respiratory: No cough, shortness of breath or wheezing. Cardiovascular: No chest pain peripheral edema, palpitations. Gastrointestinal: No abdominal distention, no abdominal pain, no change in bowel habits hematochezia or melena. Genitourinary: No dysuria, no frequency, no urgency, no nocturia. Musculoskeletal:No arthralgias, no back pain, no gait disturbance or myalgias. Neurological: No dizziness, no headaches, no numbness, no seizures, no syncope, no weakness, no tremors. Hematologic: No lymphadenopathy, no easy bruising. Psychiatric: No confusion, no hallucinations, no sleep disturbance.    Physical Exam: Filed Vitals:   06/07/11 0841  BP: 130/78  Pulse: 60   the general appearance reveals a well-developed well-nourished woman in no distress.  Head and neck reveals bilateral carotid bruits.  Pupils are equal and reactive extraocular movements are full.  Jugular venous pressure normal.  Thyroid normal. The chest  is clear to percussion and auscultation. There are no rales or rhonchi. Expansion of the chest is symmetrical.  The precordium is quiet.  The first heart sound is normal.  The second heart sound is physiologically split.  There is no murmur gallop rub or click.  There is no abnormal lift or heave.  The abdomen is soft and nontender. Bowel sounds are normal. The liver and spleen are not enlarged. There Are no abdominal masses. There are no bruits.  Extremities reveal weak pedal pulses but they are palpable.  No phlebitis or edema.Strength is normal and symmetrical in all extremities.  There is no lateralizing weakness.  There are no sensory deficits.  The skin is warm and dry.  There is no  rash.    Assessment / Plan: We discussed the importance of smoking cessation particularly as it involves her peripheral arterial occlusive disease.  She is going to try.  Continue same medication.  Recheck in 4 months for followup office visit EKG lipid panel nasal metabolic panel hepatic function panel and hemoglobin A1c

## 2011-06-10 ENCOUNTER — Telehealth: Payer: Self-pay | Admitting: *Deleted

## 2011-06-10 DIAGNOSIS — E119 Type 2 diabetes mellitus without complications: Secondary | ICD-10-CM

## 2011-06-10 MED ORDER — METFORMIN HCL 500 MG PO TABS: ORAL_TABLET | ORAL | Status: DC

## 2011-06-10 NOTE — Telephone Encounter (Signed)
Advised of labs and mailed new Rx for Metformin

## 2011-06-10 NOTE — Telephone Encounter (Signed)
Message copied by Burnell Blanks on Thu Jun 10, 2011 12:55 PM ------      Message from: Cassell Clement      Created: Mon Jun 07, 2011  7:47 PM       Please report.  The blood sugar is higher at 211.  The hemoglobin A1c is still too high at 8.1.  I would like her to increase her metformin to 1000 mg in the morning and 500 mg in the evening.  The LDL cholesterol is higher at 111 and she needs to continue Crestor and keep on a very careful diet.  The liver tests are good.

## 2011-08-17 ENCOUNTER — Other Ambulatory Visit (INDEPENDENT_AMBULATORY_CARE_PROVIDER_SITE_OTHER): Payer: Medicare Other | Admitting: *Deleted

## 2011-08-17 DIAGNOSIS — I6529 Occlusion and stenosis of unspecified carotid artery: Secondary | ICD-10-CM

## 2011-08-23 ENCOUNTER — Other Ambulatory Visit: Payer: Self-pay | Admitting: Cardiology

## 2011-08-23 DIAGNOSIS — E119 Type 2 diabetes mellitus without complications: Secondary | ICD-10-CM

## 2011-08-23 NOTE — Telephone Encounter (Signed)
Metoprolol, linsinopril, glipiside, and metformin increased two in the am and 1 in the evening dec 3rd, uses [facility]

## 2011-08-25 MED ORDER — METFORMIN HCL 500 MG PO TABS: ORAL_TABLET | ORAL | Status: DC

## 2011-08-25 MED ORDER — LISINOPRIL 5 MG PO TABS: 5.0000 mg | ORAL_TABLET | Freq: Every day | ORAL | Status: DC

## 2011-08-25 MED ORDER — METOPROLOL SUCCINATE ER 25 MG PO TB24: 25.0000 mg | ORAL_TABLET | Freq: Every day | ORAL | Status: DC

## 2011-08-25 MED ORDER — GLIPIZIDE 5 MG PO TABS: 10.0000 mg | ORAL_TABLET | Freq: Two times a day (BID) | ORAL | Status: DC

## 2011-08-25 NOTE — Telephone Encounter (Signed)
Refilled metoprolol,lisinopril,glipizide and metformin to [facility]

## 2011-08-30 ENCOUNTER — Other Ambulatory Visit: Payer: Self-pay | Admitting: *Deleted

## 2011-08-30 DIAGNOSIS — I6529 Occlusion and stenosis of unspecified carotid artery: Secondary | ICD-10-CM

## 2011-08-31 ENCOUNTER — Encounter: Payer: Self-pay | Admitting: Vascular Surgery

## 2011-08-31 NOTE — Procedures (Unsigned)
CAROTID DUPLEX EXAM  INDICATION:  Carotid disease.  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           Normal. Vertebral direction of flow:        Antegrade.        Antegrade. DUPLEX VELOCITIES (cm/sec) CCA peak systolic                   71                82 ECA peak systolic                   118               110 ICA peak systolic                   195               81 ICA end diastolic                   58                33 PLAQUE MORPHOLOGY:                  Heterogeneous.    Heterogeneous. PLAQUE AMOUNT:                      Moderate.         Mild. PLAQUE LOCATION:                    ICA/ECA/CCA       ICA/CCA  IMPRESSION: 1. Doppler velocities suggest high-end 40% to 59% stenosis of the     right proximal internal carotid artery and no hemodynamically     significant stenosis of the left internal carotid artery. 2. Patent left common carotid to subclavian artery transposition with     a velocity of 158 cm/s noted at the anastomosis. 3. No significant change in Doppler velocities when compared to the     previous exam on 02/05/2011.  ___________________________________________ Quita Skye. Hart Rochester, M.D.  CH/MEDQ  D:  08/18/2011  T:  08/18/2011  Job:  [id number]

## 2011-09-27 ENCOUNTER — Other Ambulatory Visit: Payer: Medicare Other

## 2011-09-27 ENCOUNTER — Ambulatory Visit: Payer: Medicare Other | Admitting: Cardiology

## 2011-11-11 ENCOUNTER — Ambulatory Visit (INDEPENDENT_AMBULATORY_CARE_PROVIDER_SITE_OTHER): Payer: Medicare Other | Admitting: Cardiology

## 2011-11-11 ENCOUNTER — Other Ambulatory Visit: Payer: Medicare Other

## 2011-11-11 ENCOUNTER — Encounter: Payer: Self-pay | Admitting: Cardiology

## 2011-11-11 VITALS — BP 118/74 | HR 54 | Ht 64.0 in | Wt 142.0 lb

## 2011-11-11 DIAGNOSIS — I119 Hypertensive heart disease without heart failure: Secondary | ICD-10-CM

## 2011-11-11 DIAGNOSIS — I519 Heart disease, unspecified: Secondary | ICD-10-CM

## 2011-11-11 DIAGNOSIS — E119 Type 2 diabetes mellitus without complications: Secondary | ICD-10-CM

## 2011-11-11 DIAGNOSIS — E7800 Pure hypercholesterolemia, unspecified: Secondary | ICD-10-CM

## 2011-11-11 DIAGNOSIS — Z951 Presence of aortocoronary bypass graft: Secondary | ICD-10-CM

## 2011-11-11 DIAGNOSIS — Z7189 Other specified counseling: Secondary | ICD-10-CM

## 2011-11-11 DIAGNOSIS — I6529 Occlusion and stenosis of unspecified carotid artery: Secondary | ICD-10-CM

## 2011-11-11 DIAGNOSIS — Z716 Tobacco abuse counseling: Secondary | ICD-10-CM

## 2011-11-11 LAB — HEPATIC FUNCTION PANEL
Alkaline Phosphatase: 53 U/L (ref 39–117)
Bilirubin, Direct: 0 mg/dL (ref 0.0–0.3)
Total Bilirubin: 0.4 mg/dL (ref 0.3–1.2)

## 2011-11-11 LAB — BASIC METABOLIC PANEL
BUN: 10 mg/dL (ref 6–23)
CO2: 24 mEq/L (ref 19–32)
Chloride: 108 mEq/L (ref 96–112)
Creatinine, Ser: 0.8 mg/dL (ref 0.4–1.2)
Glucose, Bld: 125 mg/dL — ABNORMAL HIGH (ref 70–99)
Potassium: 5.2 mEq/L — ABNORMAL HIGH (ref 3.5–5.1)

## 2011-11-11 LAB — LIPID PANEL
LDL Cholesterol: 79 mg/dL (ref 0–99)
VLDL: 25 mg/dL (ref 0.0–40.0)

## 2011-11-11 NOTE — Patient Instructions (Signed)
Your physician recommends that you continue on your current medications as directed. Please refer to the Current Medication list given to you today.   Your physician recommends that you schedule a follow-up appointment in: 4 months with fasting lab

## 2011-11-11 NOTE — Assessment & Plan Note (Signed)
No TIA symptoms noted since last visit

## 2011-11-11 NOTE — Assessment & Plan Note (Signed)
Has not been expressing any headaches or dizzy spells.  She's not having symptoms of congestive heart failure.  Blood pressure has been remaining stable

## 2011-11-11 NOTE — Assessment & Plan Note (Signed)
The patient denies any recurrent chest pain or angina 

## 2011-11-11 NOTE — Assessment & Plan Note (Signed)
The patient has not been experiencing any glycemic episodes.  She drinks 4 L of diet cheerwine daily.  She does not drink much water

## 2011-11-11 NOTE — Progress Notes (Signed)
Quick Note:  Please report to patient. The recent labs are stable. Continue same medication and careful diet. Potassium too high so cut back on Cheerwine (drinks 4 L/day!) A1C better. ______

## 2011-11-11 NOTE — Progress Notes (Signed)
Danielle Farrell Date of Birth:  26-Jun-1956 [facility] [address] [place], [place]  [address] [phone]         Fax   [phone]  History of Present Illness: This pleasant 56 year old woman is seen for a scheduled followup office visit.  She has a history of known ischemic heart disease.  She had coronary artery bypass graft surgery on 05/03/01.  She has a history of peripheral vascular disease.  She is a history of carotid bruits followed by vascular surgery.  She has high blood pressure high cholesterol and is diabetic.  She smokes about 5 cigarettes a day.  Current Outpatient Prescriptions  Medication Sig Dispense Refill  . aspirin 81 MG EC tablet Take 81 mg by mouth daily.        . calcium carbonate (OS-CAL) 600 MG TABS Take 600 mg by mouth 2 (two) times daily with a meal.        . glipiZIDE (GLUCOTROL) 5 MG tablet Take 2 tablets (10 mg total) by mouth 2 (two) times daily before a meal.  360 tablet  3  . lisinopril (PRINIVIL,ZESTRIL) 5 MG tablet Take 1 tablet (5 mg total) by mouth daily.  90 tablet  3  . metFORMIN (GLUCOPHAGE) 500 MG tablet 2 in the morning and 1 in the evening  270 tablet  3  . metoprolol succinate (TOPROL-XL) 25 MG 24 hr tablet Take 1 tablet (25 mg total) by mouth daily.  90 tablet  3  . nitroGLYCERIN (NITROSTAT) 0.4 MG SL tablet Place 0.4 mg under the tongue every 5 (five) minutes as needed.        Marland Kitchen PARoxetine (PAXIL) 20 MG tablet TAKE 1 TABLET DAILY  90 tablet  3  . rosuvastatin (CRESTOR) 20 MG tablet 1/2 daily or as directed  30 tablet  11    Allergies  Allergen Reactions  . Lipitor (Atorvastatin Calcium)     Patient Active Problem List  Diagnoses  . Diabetes mellitus  . Benign hypertensive heart disease without heart failure  . Hx of CABG  . Carotid stenosis, asymptomatic  . Hypercholesterolemia    History  Smoking status  . Current Everyday Smoker -- 0.5 packs/day  Smokeless tobacco  . Not on file    History  Alcohol  Use No    Family History  Problem Relation Age of Onset  . Cancer Mother   . Cancer Father     Review of Systems: Constitutional: no fever chills diaphoresis or fatigue or change in weight.  Head and neck: no hearing loss, no epistaxis, no photophobia or visual disturbance. Respiratory: No cough, shortness of breath or wheezing. Cardiovascular: No chest pain peripheral edema, palpitations. Gastrointestinal: No abdominal distention, no abdominal pain, no change in bowel habits hematochezia or melena. Genitourinary: No dysuria, no frequency, no urgency, no nocturia. Musculoskeletal:No arthralgias, no back pain, no gait disturbance or myalgias. Neurological: No dizziness, no headaches, no numbness, no seizures, no syncope, no weakness, no tremors. Hematologic: No lymphadenopathy, no easy bruising. Psychiatric: No confusion, no hallucinations, no sleep disturbance.    Physical Exam: Filed Vitals:   11/11/11 0946  BP: 118/74  Pulse: 54   general appearance reveals a well-developed well-nourished woman in no distress.Pupils equal and reactive.   Extraocular Movements are full.  There is no scleral icterus.  The mouth and pharynx are normal.  The neck is supple.  The carotids reveal bilateral bruits louder on the left.  The jugular venous pressure is normal.  The thyroid is not enlarged.  There is no lymphadenopathy. The precordium is quiet.  The first heart sound is normal.  The second heart sound is physiologically split.  There is no murmur gallop rub or click.  There is no abnormal lift or heave.  The abdomen is soft and nontender. Bowel sounds are normal. The liver and spleen are not enlarged. There Are no abdominal masses. There are no bruits.  Extremities reveal no phlebitis or edema.Strength is normal and symmetrical in all extremities.  There is no lateralizing weakness.  There are no sensory deficits. The skin is warm and dry.  There is no rash.  EKG shows sinus bradycardia  incomplete right bundle branch block and no change since 08/14/09      Assessment / Plan: Continue same medication.  Recheck in 4 months for followup office visit and lab work

## 2011-11-22 ENCOUNTER — Telehealth: Payer: Self-pay | Admitting: *Deleted

## 2011-11-22 NOTE — Telephone Encounter (Signed)
Message copied by Burnell Blanks on Mon Nov 22, 2011  3:01 PM ------      Message from: Cassell Clement      Created: Thu Nov 11, 2011  2:38 PM       Please report to patient.  The recent labs are stable. Continue same medication and careful diet. Potassium too high so cut back on Cheerwine (drinks 4 L/day!)      A1C better.

## 2011-11-22 NOTE — Telephone Encounter (Signed)
Advised of labs 

## 2012-01-03 ENCOUNTER — Other Ambulatory Visit: Payer: Self-pay | Admitting: Cardiology

## 2012-01-27 ENCOUNTER — Other Ambulatory Visit: Payer: Self-pay | Admitting: *Deleted

## 2012-01-27 DIAGNOSIS — F329 Major depressive disorder, single episode, unspecified: Secondary | ICD-10-CM

## 2012-01-27 MED ORDER — PAROXETINE HCL 20 MG PO TABS: 20.0000 mg | ORAL_TABLET | ORAL | Status: DC

## 2012-03-07 ENCOUNTER — Other Ambulatory Visit: Payer: Medicare Other

## 2012-03-07 ENCOUNTER — Ambulatory Visit: Payer: Medicare Other | Admitting: Cardiology

## 2012-05-08 ENCOUNTER — Other Ambulatory Visit: Payer: Self-pay | Admitting: *Deleted

## 2012-05-08 DIAGNOSIS — E7800 Pure hypercholesterolemia, unspecified: Secondary | ICD-10-CM

## 2012-05-08 DIAGNOSIS — I119 Hypertensive heart disease without heart failure: Secondary | ICD-10-CM

## 2012-05-08 DIAGNOSIS — I6529 Occlusion and stenosis of unspecified carotid artery: Secondary | ICD-10-CM

## 2012-05-18 ENCOUNTER — Encounter: Payer: Self-pay | Admitting: Cardiology

## 2012-05-18 ENCOUNTER — Other Ambulatory Visit (INDEPENDENT_AMBULATORY_CARE_PROVIDER_SITE_OTHER): Payer: Medicare Other

## 2012-05-18 ENCOUNTER — Ambulatory Visit (INDEPENDENT_AMBULATORY_CARE_PROVIDER_SITE_OTHER): Payer: Medicare Other | Admitting: Cardiology

## 2012-05-18 VITALS — BP 133/68 | HR 73 | Resp 18 | Ht 64.0 in | Wt 129.1 lb

## 2012-05-18 DIAGNOSIS — I6529 Occlusion and stenosis of unspecified carotid artery: Secondary | ICD-10-CM

## 2012-05-18 DIAGNOSIS — Z951 Presence of aortocoronary bypass graft: Secondary | ICD-10-CM

## 2012-05-18 DIAGNOSIS — E7800 Pure hypercholesterolemia, unspecified: Secondary | ICD-10-CM

## 2012-05-18 DIAGNOSIS — Z716 Tobacco abuse counseling: Secondary | ICD-10-CM

## 2012-05-18 DIAGNOSIS — IMO0001 Reserved for inherently not codable concepts without codable children: Secondary | ICD-10-CM

## 2012-05-18 DIAGNOSIS — I519 Heart disease, unspecified: Secondary | ICD-10-CM

## 2012-05-18 DIAGNOSIS — R0989 Other specified symptoms and signs involving the circulatory and respiratory systems: Secondary | ICD-10-CM

## 2012-05-18 DIAGNOSIS — I119 Hypertensive heart disease without heart failure: Secondary | ICD-10-CM

## 2012-05-18 DIAGNOSIS — G4700 Insomnia, unspecified: Secondary | ICD-10-CM

## 2012-05-18 LAB — LIPID PANEL
Cholesterol: 164 mg/dL (ref 0–200)
Triglycerides: 74 mg/dL (ref 0.0–149.0)

## 2012-05-18 LAB — CBC WITH DIFFERENTIAL/PLATELET
Basophils Relative: 1.5 % (ref 0.0–3.0)
Eosinophils Absolute: 0.1 10*3/uL (ref 0.0–0.7)
MCHC: 33.2 g/dL (ref 30.0–36.0)
MCV: 91.5 fl (ref 78.0–100.0)
Monocytes Absolute: 0.7 10*3/uL (ref 0.1–1.0)
Neutrophils Relative %: 67.7 % (ref 43.0–77.0)
Platelets: 244 10*3/uL (ref 150.0–400.0)
RBC: 4.78 Mil/uL (ref 3.87–5.11)
RDW: 13.5 % (ref 11.5–14.6)

## 2012-05-18 LAB — BASIC METABOLIC PANEL
BUN: 8 mg/dL (ref 6–23)
CO2: 26 mEq/L (ref 19–32)
Chloride: 102 mEq/L (ref 96–112)
Creatinine, Ser: 0.8 mg/dL (ref 0.4–1.2)
Glucose, Bld: 168 mg/dL — ABNORMAL HIGH (ref 70–99)

## 2012-05-18 LAB — HEMOGLOBIN A1C: Hgb A1c MFr Bld: 7.5 % — ABNORMAL HIGH (ref 4.6–6.5)

## 2012-05-18 NOTE — Assessment & Plan Note (Signed)
The patient denies any headaches or dizzy spells.  No syncope.  No symptoms of congestive heart failure

## 2012-05-18 NOTE — Assessment & Plan Note (Signed)
The patient has a past history of insomnia.  She has been using aroma therapy to help her sleep.  She takes 3 deep breaths of lavender oil and has no difficulty falling asleep or staying asleep.

## 2012-05-18 NOTE — Progress Notes (Signed)
Quick Note:  Please report to patient. The recent labs are stable. Continue same medication and careful diet. A1C is 7.5. Copy to Dr. Shelah Lewandowsky. ______

## 2012-05-18 NOTE — Assessment & Plan Note (Signed)
The patient has not been having any hypoglycemic episodes. She is trying to be moderately careful with her dietary intake.

## 2012-05-18 NOTE — Progress Notes (Signed)
Danielle Farrell Date of Birth:  September 07, 1955 [facility] [address] [place], [place]  [address] [phone]         Fax   [phone]  History of Present Illness: This pleasant 56 year old woman is seen for a scheduled followup office visit. She has a history of known ischemic heart disease. She had coronary artery bypass graft surgery on 05/03/01. She has a history of peripheral vascular disease. She is a history of carotid bruits followed by vascular surgery.  She is checked every February by Dr. Hart Rochester. She has high blood pressure high cholesterol and is diabetic. She smokes about 5 cigarettes a day.  She has a history of osteoarthritis of the right hip which has limited how much she is able to walk each day.   Current Outpatient Prescriptions  Medication Sig Dispense Refill  . aspirin 81 MG EC tablet Take 81 mg by mouth daily.        . calcium carbonate (OS-CAL) 600 MG TABS Take 600 mg by mouth 2 (two) times daily with a meal.        . glipiZIDE (GLUCOTROL) 5 MG tablet Take 2 tablets (10 mg total) by mouth 2 (two) times daily before a meal.  360 tablet  3  . lisinopril (PRINIVIL,ZESTRIL) 5 MG tablet Take 1 tablet (5 mg total) by mouth daily.  90 tablet  3  . metFORMIN (GLUCOPHAGE) 500 MG tablet 2 in the morning and 1 in the evening  270 tablet  3  . metoprolol succinate (TOPROL-XL) 25 MG 24 hr tablet Take 1 tablet (25 mg total) by mouth daily.  90 tablet  3  . nitroGLYCERIN (NITROSTAT) 0.4 MG SL tablet Place 0.4 mg under the tongue every 5 (five) minutes as needed.        Marland Kitchen PARoxetine (PAXIL) 20 MG tablet Take 1 tablet (20 mg total) by mouth every morning.  90 tablet  3  . rosuvastatin (CRESTOR) 20 MG tablet 1/2 daily or as directed  30 tablet  11  . [DISCONTINUED] CRESTOR 10 MG tablet TAKE ONE TABLET BY MOUTH AT BEDTIME  30 each  11    Allergies  Allergen Reactions  . Lipitor (Atorvastatin Calcium)     Patient Active Problem List  Diagnosis  . Diabetes  mellitus  . Benign hypertensive heart disease without heart failure  . Hx of CABG  . Carotid stenosis, asymptomatic  . Hypercholesterolemia    History  Smoking status  . Current Every Day Smoker -- 0.5 packs/day  Smokeless tobacco  . Not on file    History  Alcohol Use No    Family History  Problem Relation Age of Onset  . Cancer Mother   . Cancer Father     Review of Systems: Constitutional: no fever chills diaphoresis or fatigue or change in weight.  Head and neck: no hearing loss, no epistaxis, no photophobia or visual disturbance. Respiratory: No cough, shortness of breath or wheezing. Cardiovascular: No chest pain peripheral edema, palpitations. Gastrointestinal: No abdominal distention, no abdominal pain, no change in bowel habits hematochezia or melena. Genitourinary: No dysuria, no frequency, no urgency, no nocturia. Musculoskeletal:No arthralgias, no back pain, no gait disturbance or myalgias. Neurological: No dizziness, no headaches, no numbness, no seizures, no syncope, no weakness, no tremors. Hematologic: No lymphadenopathy, no easy bruising. Psychiatric: No confusion, no hallucinations, no sleep disturbance.    Physical Exam: Filed Vitals:   05/18/12 0856  BP: 133/68  Pulse: 73  Resp: 18  the general appearance reveals a well-developed well-nourished woman in no distress.The head and neck exam reveals pupils equal and reactive.  Extraocular movements are full.  There is no scleral icterus.  The mouth and pharynx are normal.  The neck is supple.  The carotids reveal soft carotid bruits.  The jugular venous pressure is normal.  The  thyroid is not enlarged.  There is no lymphadenopathy.  The chest is clear to percussion and auscultation.  There are no rales or rhonchi.  Expansion of the chest is symmetrical.  The precordium is quiet.  The first heart sound is normal.  The second heart sound is physiologically split.  There is no murmur gallop rub or click.   There is no abnormal lift or heave.  The abdomen is soft and nontender.  The bowel sounds are normal.  The liver and spleen are not enlarged.  There are no abdominal masses.  There are no abdominal bruits.  Extremities reveal weak pedal pulses.  There is no phlebitis or edema.  There is no cyanosis or clubbing.  Strength is normal and symmetrical in all extremities.  There is no lateralizing weakness.  There are no sensory deficits.  The skin is warm and dry.  There is no rash.     Assessment / Plan:  Continue same medication.  Blood work today pending.  Recheck in 6 months for followup office visit lipid panel hepatic function panel basal metabolic panel and A1c

## 2012-05-18 NOTE — Patient Instructions (Addendum)
Will obtain labs today and call you with the results (lp.bmet/hfp/a1c)  Your physician recommends that you continue on your current medications as directed. Please refer to the Current Medication list given to you today.  Your physician wants you to follow-up in: 6 months with fasting labs (lp/bmet/hfp/a1c)  You will receive a reminder letter in the mail two months in advance. If you don't receive a letter, please call our office to schedule the follow-up appointment.  

## 2012-05-18 NOTE — Assessment & Plan Note (Signed)
The patient has not had any TIA symptoms. 

## 2012-05-19 ENCOUNTER — Telehealth: Payer: Self-pay | Admitting: *Deleted

## 2012-05-19 NOTE — Telephone Encounter (Signed)
Message copied by Burnell Blanks on Fri May 19, 2012 11:30 AM ------      Message from: Cassell Clement      Created: Thu May 18, 2012  8:47 PM       Please report to patient.  The recent labs are stable. Continue same medication and careful diet. A1C is 7.5.   Copy to Dr. Shelah Lewandowsky.

## 2012-05-19 NOTE — Telephone Encounter (Signed)
Mailed copy of labs and left message to call if any questions  

## 2012-07-05 DIAGNOSIS — J40 Bronchitis, not specified as acute or chronic: Secondary | ICD-10-CM

## 2012-07-05 DIAGNOSIS — J189 Pneumonia, unspecified organism: Secondary | ICD-10-CM

## 2012-07-05 HISTORY — DX: Pneumonia, unspecified organism: J18.9

## 2012-07-05 HISTORY — DX: Bronchitis, not specified as acute or chronic: J40

## 2012-08-15 ENCOUNTER — Other Ambulatory Visit (INDEPENDENT_AMBULATORY_CARE_PROVIDER_SITE_OTHER): Payer: Medicare Other | Admitting: *Deleted

## 2012-08-15 ENCOUNTER — Ambulatory Visit (INDEPENDENT_AMBULATORY_CARE_PROVIDER_SITE_OTHER): Payer: Medicare Other | Admitting: Neurosurgery

## 2012-08-15 ENCOUNTER — Telehealth: Payer: Self-pay | Admitting: Cardiology

## 2012-08-15 ENCOUNTER — Encounter: Payer: Self-pay | Admitting: Neurosurgery

## 2012-08-15 VITALS — BP 105/68 | HR 54 | Resp 16 | Ht 65.5 in | Wt 132.0 lb

## 2012-08-15 DIAGNOSIS — I6529 Occlusion and stenosis of unspecified carotid artery: Secondary | ICD-10-CM

## 2012-08-15 DIAGNOSIS — Z48812 Encounter for surgical aftercare following surgery on the circulatory system: Secondary | ICD-10-CM

## 2012-08-15 NOTE — Progress Notes (Addendum)
[facility] Carotid Office Note  CC: Carotid surveillance Referring Physician: Hart Rochester  History of Present Illness: 57 year old female patient of Dr. Hart Rochester status post left subclavian artery transposition into the common carotid artery in 2003. The patient denies any upper extremity pain, the patient denies any signs or symptoms of CVA, TIA, amaurosis fugax or any neural deficit. The patient denies any new medical diagnoses or recent surgery.  Past Medical History  Diagnosis Date  . Hyperlipidemia   . IHD (ischemic heart disease)   . PAOD (peripheral arterial occlusive disease)   . Carotid bruit   . Diabetes mellitus   . Hypertension   . PVD (peripheral vascular disease)   . Coronary artery disease   . Dizziness   . Persistent headaches   . Pneumonia Jan. 2014  . Bronchitis Jan 2014    became pneumonia    ROS: [x]  Positive   [ ]  Denies    General: [ ]  Weight loss, [ ]  Fever, [ ]  chills Neurologic: [ ]  Dizziness, [ ]  Blackouts, [ ]  Seizure [ ]  Stroke, [ ]  "Mini stroke", [ ]  Slurred speech, [ ]  Temporary blindness; [ ]  weakness in arms or legs, [ ]  Hoarseness Cardiac: [ ]  Chest pain/pressure, [ ]  Shortness of breath at rest [ ]  Shortness of breath with exertion, [ ]  Atrial fibrillation or irregular heartbeat Vascular: [ ]  Pain in legs with walking, [ ]  Pain in legs at rest, [ ]  Pain in legs at night,  [ ]  Non-healing ulcer, [ ]  Blood clot in vein/DVT,   Pulmonary: [ ]  Home oxygen, [ ]  Productive cough, [ ]  Coughing up blood, [ ]  Asthma,  [ ]  Wheezing Musculoskeletal:  [ ]  Arthritis, [ ]  Low back pain, [ ]  Joint pain Hematologic: [ ]  Easy Bruising, [ ]  Anemia; [ ]  Hepatitis Gastrointestinal: [ ]  Blood in stool, [ ]  Gastroesophageal Reflux/heartburn, [ ]  Trouble swallowing Urinary: [ ]  chronic Kidney disease, [ ]  on HD - [ ]  MWF or [ ]  TTHS, [ ]  Burning with urination, [ ]  Difficulty urinating Skin: [ ]  Rashes, [ ]  Wounds Psychological: [ ]  Anxiety, [  ] Depression   Social History History  Substance Use Topics  . Smoking status: Current Every Day Smoker -- 0.50 packs/day  . Smokeless tobacco: Never Used  . Alcohol Use: No    Family History Family History  Problem Relation Age of Onset  . Cancer Mother   . Cancer Father     Allergies  Allergen Reactions  . Lipitor (Atorvastatin Calcium)     Current Outpatient Prescriptions  Medication Sig Dispense Refill  . aspirin 81 MG EC tablet Take 81 mg by mouth daily.        . calcium carbonate (OS-CAL) 600 MG TABS Take 600 mg by mouth 2 (two) times daily with a meal.        . glipiZIDE (GLUCOTROL) 5 MG tablet Take 2 tablets (10 mg total) by mouth 2 (two) times daily before a meal.  360 tablet  3  . lisinopril (PRINIVIL,ZESTRIL) 5 MG tablet Take 1 tablet (5 mg total) by mouth daily.  90 tablet  3  . metFORMIN (GLUCOPHAGE) 500 MG tablet 2 in the morning and 1 in the evening  270 tablet  3  . metoprolol succinate (TOPROL-XL) 25 MG 24 hr tablet Take 1 tablet (25 mg total) by mouth daily.  90 tablet  3  . nitroGLYCERIN (NITROSTAT) 0.4 MG SL tablet Place 0.4 mg  under the tongue every 5 (five) minutes as needed.        Marland Kitchen PARoxetine (PAXIL) 20 MG tablet Take 1 tablet (20 mg total) by mouth every morning.  90 tablet  3  . rosuvastatin (CRESTOR) 20 MG tablet 1/2 daily or as directed  30 tablet  11   No current facility-administered medications for this visit.    Physical Examination  Filed Vitals:   08/15/12 1443  BP: 105/68  Pulse: 54  Resp:     Body mass index is 21.62 kg/(m^2).  General:  WDWN in NAD Gait: Normal HEENT: WNL Eyes: Pupils equal Pulmonary: normal non-labored breathing , without Rales, rhonchi,  wheezing Cardiac: RRR, without  Murmurs, rubs or gallops; Abdomen: soft, NT, no masses Skin: no rashes, ulcers noted  Vascular Exam Pulses: 3+ radial pulses bilaterally Carotid bruits: Carotid pulses to auscultation Extremities without ischemic changes, no Gangrene ,  no cellulitis; no open wounds;  Musculoskeletal: no muscle wasting or atrophy   Neurologic: A&O X 3; Appropriate Affect ; SENSATION: normal; MOTOR FUNCTION:  moving all extremities equally. Speech is fluent/normal  Non-Invasive Vascular Imaging CAROTID DUPLEX 08/15/2012  Right ICA 60 - 79 % stenosis Left ICA 20 - 39 % stenosis   ASSESSMENT/PLAN: Asymptomatic patient with unchanged carotid duplex from previous exam. The patient will followup in 6 months with repeat carotid duplex. The patient's questions were encouraged and answered, she is in agreement with this plan. The patient knows the signs and symptoms of CVA and knows to call 911 should this occur.  Lauree Chandler ANP   Clinic MD: Hart Rochester

## 2012-08-15 NOTE — Telephone Encounter (Signed)
Pt requesting samples of crestor °

## 2012-08-15 NOTE — Telephone Encounter (Signed)
No samples at this time, advised patient  

## 2012-08-16 ENCOUNTER — Ambulatory Visit: Payer: Medicare Other | Admitting: Neurosurgery

## 2012-08-16 ENCOUNTER — Encounter: Payer: Self-pay | Admitting: Neurosurgery

## 2012-08-16 ENCOUNTER — Other Ambulatory Visit: Payer: Medicare Other

## 2012-08-16 NOTE — Addendum Note (Signed)
Addended by: Sharee Pimple on: 08/16/2012 09:04 AM   Modules accepted: Orders

## 2012-09-06 ENCOUNTER — Other Ambulatory Visit: Payer: Self-pay | Admitting: *Deleted

## 2012-09-06 DIAGNOSIS — E119 Type 2 diabetes mellitus without complications: Secondary | ICD-10-CM

## 2012-09-06 MED ORDER — METFORMIN HCL 500 MG PO TABS: ORAL_TABLET | ORAL | Status: DC

## 2012-09-06 MED ORDER — GLIPIZIDE 5 MG PO TABS: 10.0000 mg | ORAL_TABLET | Freq: Two times a day (BID) | ORAL | Status: DC

## 2013-01-25 ENCOUNTER — Other Ambulatory Visit: Payer: Self-pay

## 2013-01-25 MED ORDER — ROSUVASTATIN CALCIUM 20 MG PO TABS: ORAL_TABLET | ORAL | Status: DC

## 2013-01-30 ENCOUNTER — Other Ambulatory Visit: Payer: Self-pay | Admitting: *Deleted

## 2013-01-30 MED ORDER — ROSUVASTATIN CALCIUM 20 MG PO TABS: ORAL_TABLET | ORAL | Status: DC

## 2013-01-30 NOTE — Telephone Encounter (Signed)
Called and cancelled refill of Crestor to [facility] and switched it to [facility] Per pt call 01/30/13.

## 2013-01-30 NOTE — Telephone Encounter (Signed)
Spoke to [employer] at [facility]. He cancelled the refill on 01/30/13

## 2013-02-12 ENCOUNTER — Encounter: Payer: Self-pay | Admitting: Vascular Surgery

## 2013-02-13 ENCOUNTER — Other Ambulatory Visit (INDEPENDENT_AMBULATORY_CARE_PROVIDER_SITE_OTHER): Payer: Medicare Other | Admitting: Vascular Surgery

## 2013-02-13 ENCOUNTER — Ambulatory Visit (INDEPENDENT_AMBULATORY_CARE_PROVIDER_SITE_OTHER): Payer: Self-pay | Admitting: Vascular Surgery

## 2013-02-13 ENCOUNTER — Encounter: Payer: Self-pay | Admitting: Vascular Surgery

## 2013-02-13 DIAGNOSIS — Z48812 Encounter for surgical aftercare following surgery on the circulatory system: Secondary | ICD-10-CM

## 2013-02-13 DIAGNOSIS — I6529 Occlusion and stenosis of unspecified carotid artery: Secondary | ICD-10-CM

## 2013-02-13 NOTE — Progress Notes (Signed)
Subjective:     Patient ID: Danielle Farrell, female   DOB: 1955-10-05, 57 y.o.   MRN: [id number]  HPI this 57 year old female returns for continued followup regarding her right carotid occlusive disease. She denies any neurologic symptoms such as lateralizing weakness, aphasia, amaurosis fugax, diplopia, blurred vision, or syncope. She has a history of a left carotid subclavian anastomosis which I performed in 2003 for left arm claudication which remains asymptomatic. She also has no lower extremity claudication having undergone aortobifemoral bypass graft by me in 2002. She had coronary artery bypass grafting in the past and denies any active cardiac symptoms.  Past Medical History  Diagnosis Date  . Hyperlipidemia   . IHD (ischemic heart disease)   . PAOD (peripheral arterial occlusive disease)   . Carotid bruit   . Diabetes mellitus   . Hypertension   . PVD (peripheral vascular disease)   . Coronary artery disease   . Dizziness   . Persistent headaches   . Pneumonia Jan. 2014  . Bronchitis Jan 2014    became pneumonia    History  Substance Use Topics  . Smoking status: Current Every Day Smoker -- 0.50 packs/day    Types: Cigarettes  . Smokeless tobacco: Never Used  . Alcohol Use: No    Family History  Problem Relation Age of Onset  . Cancer Mother   . Cancer Father     Allergies  Allergen Reactions  . Lipitor (Atorvastatin Calcium)     Current outpatient prescriptions:aspirin 81 MG EC tablet, Take 81 mg by mouth daily.  , Disp: , Rfl: ;  calcium carbonate (OS-CAL) 600 MG TABS, Take 600 mg by mouth 2 (two) times daily with a meal.  , Disp: , Rfl: ;  glipiZIDE (GLUCOTROL) 5 MG tablet, Take 2 tablets (10 mg total) by mouth 2 (two) times daily before a meal., Disp: 360 tablet, Rfl: 1;  Ibuprofen-Diphenhydramine Cit (ADVIL PM PO), Take by mouth., Disp: , Rfl:  lisinopril (PRINIVIL,ZESTRIL) 5 MG tablet, Take 1 tablet (5 mg total) by mouth daily., Disp: 90 tablet, Rfl: 3;  metFORMIN  (GLUCOPHAGE) 500 MG tablet, 2 in the morning and 1 in the evening, Disp: 270 tablet, Rfl: 1;  metoprolol succinate (TOPROL-XL) 25 MG 24 hr tablet, Take 1 tablet (25 mg total) by mouth daily., Disp: 90 tablet, Rfl: 3 nitroGLYCERIN (NITROSTAT) 0.4 MG SL tablet, Place 0.4 mg under the tongue every 5 (five) minutes as needed.  , Disp: , Rfl: ;  PARoxetine (PAXIL) 20 MG tablet, Take 1 tablet (20 mg total) by mouth every morning., Disp: 90 tablet, Rfl: 3;  rosuvastatin (CRESTOR) 20 MG tablet, 1/2 daily or as directed, Disp: 30 tablet, Rfl: 0  BP 136/81  Pulse 61  Resp 16  Ht 5\' 4"  (1.626 m)  Wt 142 lb 9.6 oz (64.683 kg)  BMI 24.47 kg/m2  Body mass index is 24.47 kg/(m^2).           Review of Systems denies chest pain, dyspnea on exertion, PND, orthopnea, hemoptysis, all neurologic symptoms. Does complain of right hip discomfort at rest. Has not been evaluated by orthopedic surgeon. No claudication symptoms. All other systems negative and a complete review of systems    Objective:   Physical Exam BP 136/81  Pulse 61  Resp 16  Ht 5\' 4"  (1.626 m)  Wt 142 lb 9.6 oz (64.683 kg)  BMI 24.47 kg/m2  Gen.-alert and oriented x3 in no apparent distress HEENT normal for age Lungs no rhonchi or  wheezing Cardiovascular regular rhythm no murmurs carotid pulses 3+ palpable -soft bruit in left supraclavicular area  Abdomen soft nontender no palpable masses Musculoskeletal free of  major deformities Skin clear -no rashes Neurologic normal Lower extremities 3+ femoral and posterior tibial pulses palpable bilaterally with no edema Bilateral 3+ brachial and radial pulses palpable.  Today I ordered a carotid duplex exam which I reviewed and interpreted. There is an approximate 70% right ICA stenosis slightly progressed from previous study. Left internal carotid is widely patent as is the left carotid subclavian anastomosis.        Assessment:     Stable 70% right ICA stenosis-asymptomatic Doing  well post left carotid subclavian anastomosis and aortobifemoral bypass grafting    Plan:     Return in 6 months for followup carotid duplex exam and see nurse practitioner  Emphasized to patient the importance of complete tobacco cessation. She has continued to smoke about one half pack per day despite all of her vascular surgery

## 2013-02-14 NOTE — Addendum Note (Signed)
Addended by: Sharee Pimple on: 02/14/2013 08:45 AM   Modules accepted: Orders

## 2013-02-24 ENCOUNTER — Inpatient Hospital Stay (HOSPITAL_COMMUNITY)
Admission: AD | Admit: 2013-02-24 | Discharge: 2013-02-25 | DRG: 103 | Disposition: A | Discharge status: Home / Self Care | Payer: Medicare Other | Source: Other Acute Inpatient Hospital | Attending: Internal Medicine | Admitting: Internal Medicine

## 2013-02-24 ENCOUNTER — Encounter (HOSPITAL_COMMUNITY): Payer: Self-pay | Admitting: *Deleted

## 2013-02-24 DIAGNOSIS — E119 Type 2 diabetes mellitus without complications: Secondary | ICD-10-CM | POA: Diagnosis present

## 2013-02-24 DIAGNOSIS — I2510 Atherosclerotic heart disease of native coronary artery without angina pectoris: Secondary | ICD-10-CM | POA: Diagnosis present

## 2013-02-24 DIAGNOSIS — R51 Headache: Secondary | ICD-10-CM

## 2013-02-24 DIAGNOSIS — I6529 Occlusion and stenosis of unspecified carotid artery: Secondary | ICD-10-CM | POA: Diagnosis present

## 2013-02-24 DIAGNOSIS — E7800 Pure hypercholesterolemia, unspecified: Secondary | ICD-10-CM

## 2013-02-24 DIAGNOSIS — Z7982 Long term (current) use of aspirin: Secondary | ICD-10-CM

## 2013-02-24 DIAGNOSIS — IMO0001 Reserved for inherently not codable concepts without codable children: Secondary | ICD-10-CM | POA: Diagnosis present

## 2013-02-24 DIAGNOSIS — F17200 Nicotine dependence, unspecified, uncomplicated: Secondary | ICD-10-CM | POA: Diagnosis present

## 2013-02-24 DIAGNOSIS — G43109 Migraine with aura, not intractable, without status migrainosus: Principal | ICD-10-CM | POA: Diagnosis present

## 2013-02-24 DIAGNOSIS — I119 Hypertensive heart disease without heart failure: Secondary | ICD-10-CM | POA: Diagnosis present

## 2013-02-24 DIAGNOSIS — R519 Headache, unspecified: Secondary | ICD-10-CM

## 2013-02-24 DIAGNOSIS — G4700 Insomnia, unspecified: Secondary | ICD-10-CM

## 2013-02-24 DIAGNOSIS — Z951 Presence of aortocoronary bypass graft: Secondary | ICD-10-CM

## 2013-02-24 DIAGNOSIS — E785 Hyperlipidemia, unspecified: Secondary | ICD-10-CM | POA: Diagnosis present

## 2013-02-24 LAB — COMPREHENSIVE METABOLIC PANEL
AST: 15 U/L (ref 0–37)
Albumin: 4.1 g/dL (ref 3.5–5.2)
BUN: 7 mg/dL (ref 6–23)
Creatinine, Ser: 0.84 mg/dL (ref 0.50–1.10)
Total Protein: 7.5 g/dL (ref 6.0–8.3)

## 2013-02-24 LAB — GLUCOSE, CAPILLARY
Glucose-Capillary: 135 mg/dL — ABNORMAL HIGH (ref 70–99)
Glucose-Capillary: 91 mg/dL (ref 70–99)
Glucose-Capillary: 125 mg/dL — ABNORMAL HIGH (ref 70–99)

## 2013-02-24 LAB — CBC
HCT: 38.5 % (ref 36.0–46.0)
MCHC: 36.6 g/dL — ABNORMAL HIGH (ref 30.0–36.0)
MCV: 85.4 fL (ref 78.0–100.0)
Platelets: 236 10*3/uL (ref 150–400)
RDW: 12.9 % (ref 11.5–15.5)

## 2013-02-24 LAB — SEDIMENTATION RATE: Sed Rate: 26 mm/hr — ABNORMAL HIGH (ref 0–22)

## 2013-02-24 MED ORDER — GLIPIZIDE 10 MG PO TABS
10.0000 mg | ORAL_TABLET | Freq: Two times a day (BID) | ORAL | Status: DC
  Administered 2013-02-24 – 2013-02-25 (×3): 10 mg via ORAL
  Filled 2013-02-24 (×5): qty 1

## 2013-02-24 MED ORDER — PNEUMOCOCCAL VAC POLYVALENT 25 MCG/0.5ML IJ INJ
0.5000 mL | INJECTION | INTRAMUSCULAR | Status: AC
  Administered 2013-02-25: 0.5 mL via INTRAMUSCULAR
  Filled 2013-02-24: qty 0.5

## 2013-02-24 MED ORDER — MAGNESIUM SULFATE 40 MG/ML IJ SOLN
2.0000 g | Freq: Once | INTRAMUSCULAR | Status: AC
  Administered 2013-02-24: 2 g via INTRAVENOUS
  Filled 2013-02-24: qty 50

## 2013-02-24 MED ORDER — INSULIN ASPART 100 UNIT/ML ~~LOC~~ SOLN
0.0000 [IU] | Freq: Three times a day (TID) | SUBCUTANEOUS | Status: DC
  Administered 2013-02-24: 2 [IU] via SUBCUTANEOUS
  Administered 2013-02-25: 3 [IU] via SUBCUTANEOUS

## 2013-02-24 MED ORDER — PROCHLORPERAZINE EDISYLATE 5 MG/ML IJ SOLN
10.0000 mg | Freq: Once | INTRAMUSCULAR | Status: AC
  Administered 2013-02-24: 10 mg via INTRAVENOUS
  Filled 2013-02-24: qty 2

## 2013-02-24 MED ORDER — LISINOPRIL 5 MG PO TABS
5.0000 mg | ORAL_TABLET | Freq: Every day | ORAL | Status: DC
  Administered 2013-02-24: 5 mg via ORAL
  Filled 2013-02-24 (×2): qty 1

## 2013-02-24 MED ORDER — INSULIN ASPART 100 UNIT/ML ~~LOC~~ SOLN: 0.0000 [IU] | Freq: Every day | SUBCUTANEOUS | Status: DC

## 2013-02-24 MED ORDER — PROCHLORPERAZINE EDISYLATE 5 MG/ML IJ SOLN: 10.0000 mg | Freq: Four times a day (QID) | INTRAMUSCULAR | Status: DC | PRN

## 2013-02-24 MED ORDER — ACETAMINOPHEN 325 MG PO TABS: 650.0000 mg | ORAL_TABLET | Freq: Four times a day (QID) | ORAL | Status: DC | PRN

## 2013-02-24 MED ORDER — PAROXETINE HCL 20 MG PO TABS
20.0000 mg | ORAL_TABLET | Freq: Every day | ORAL | Status: DC
Start: 2013-02-24 — End: 2013-02-25
  Administered 2013-02-24 – 2013-02-25 (×2): 20 mg via ORAL
  Filled 2013-02-24 (×3): qty 1

## 2013-02-24 MED ORDER — ENOXAPARIN SODIUM 40 MG/0.4ML ~~LOC~~ SOLN
40.0000 mg | SUBCUTANEOUS | Status: DC
  Administered 2013-02-24: 40 mg via SUBCUTANEOUS
  Filled 2013-02-24 (×2): qty 0.4

## 2013-02-24 MED ORDER — ONDANSETRON HCL 4 MG PO TABS: 4.0000 mg | ORAL_TABLET | Freq: Four times a day (QID) | ORAL | Status: DC | PRN

## 2013-02-24 MED ORDER — DIPHENHYDRAMINE HCL 50 MG/ML IJ SOLN
12.5000 mg | Freq: Once | INTRAMUSCULAR | Status: AC
  Administered 2013-02-24: 12.5 mg via INTRAVENOUS
  Filled 2013-02-24: qty 1

## 2013-02-24 MED ORDER — ONDANSETRON HCL 4 MG/2ML IJ SOLN: 4.0000 mg | Freq: Four times a day (QID) | INTRAMUSCULAR | Status: DC | PRN

## 2013-02-24 MED ORDER — SODIUM CHLORIDE 0.9 % IJ SOLN
3.0000 mL | Freq: Two times a day (BID) | INTRAMUSCULAR | Status: DC
  Administered 2013-02-24 (×2): 3 mL via INTRAVENOUS

## 2013-02-24 MED ORDER — METOPROLOL SUCCINATE ER 25 MG PO TB24
25.0000 mg | ORAL_TABLET | Freq: Every day | ORAL | Status: DC
  Administered 2013-02-24 – 2013-02-25 (×2): 25 mg via ORAL
  Filled 2013-02-24 (×2): qty 1

## 2013-02-24 MED ORDER — DEXTROSE 5 % IV SOLN
500.0000 mg | Freq: Once | INTRAVENOUS | Status: AC
  Administered 2013-02-24: 500 mg via INTRAVENOUS
  Filled 2013-02-24: qty 5

## 2013-02-24 MED ORDER — ACETAMINOPHEN 650 MG RE SUPP: 650.0000 mg | Freq: Four times a day (QID) | RECTAL | Status: DC | PRN

## 2013-02-24 NOTE — Progress Notes (Signed)
Came back to see patient.  Headache resolved, asking to go home.  Deone Omahoney DO

## 2013-02-24 NOTE — H&P (Addendum)
[facility] History and Physical  KADIDIA STHILAIRE GUY:[id number] DOB: 01/06/1956 DOA: 02/24/2013  Referring physician: er PCP: Kaleen Mask, MD  Specialists: neuro  Chief Complaint: headache  HPI: Danielle Farrell is a 57 y.o. female  Who was brought from [facility] ER.  History obtained from chart as patient just received pain medications.   She presented with severe headache. She has a history of lightheadedness that occurs primarily after riding in a car for a long period and then standing up. 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  Tonight, she noticed that her vision out of her left side with slightly blurry. Approximately 10 - 15 minutes later, she felt a headache "coming on" and therefore went to lay down. She subsequently had development of a severe throbbing unilateral headache is retro-orbital in location. She is unclear over what time frame the pain developed, but definitely had visual symptoms 10 - 15 minutes prior. It is associated with photophobia as well as nausea. She has had similar episodes in the past, but was able to take analgesics and keep them down, but this time she was unable to keep analgesics on her stomach and therefore presented to the emergency room.  Review of Systems: unable to assess, patient just received pain medications   Past Medical History  Diagnosis Date  . Hyperlipidemia   . IHD (ischemic heart disease)   . PAOD (peripheral arterial occlusive disease)   . Carotid bruit   . Diabetes mellitus   . Hypertension   . PVD (peripheral vascular disease)   . Coronary artery disease   . Dizziness   . Persistent headaches   . Pneumonia Jan. 2014  . Bronchitis Jan 2014    became pneumonia   Past Surgical History  Procedure Laterality Date  . Cardiac catheterization  04/28/2001  . 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 US echocardiography  07/16/2004    EF 55-60%   . Cardiovascular stress test  11/23/2006    EF 64%  . Carotid-subclavian bypass graft  2003   Social History:  reports that she has been smoking Cigarettes.  She has been smoking about 0.50 packs per day. She has never used smokeless tobacco. She reports that she does not drink alcohol or use illicit drugs.   Allergies  Allergen Reactions  . Lipitor [Atorvastatin Calcium]     Family History  Problem Relation Age of Onset  . Cancer Mother   . Cancer Father     Prior to Admission medications   Medication Sig Start Date End Date Taking? Authorizing Provider  aspirin 81 MG EC tablet Take 81 mg by mouth daily.      Historical Provider, MD  calcium carbonate (OS-CAL) 600 MG TABS Take 600 mg by mouth 2 (two) times daily with a meal.      Historical Provider, MD  glipiZIDE (GLUCOTROL) 5 MG tablet Take 2 tablets (10 mg total) by mouth 2 (two) times daily before a meal. 09/06/12   Cassell Clement, MD  Ibuprofen-Diphenhydramine Cit (ADVIL PM PO) Take by mouth.    Historical Provider, MD  lisinopril (PRINIVIL,ZESTRIL) 5 MG tablet Take 1 tablet (5 mg total) by mouth daily. 08/25/11   Cassell Clement, MD  metFORMIN (GLUCOPHAGE) 500 MG tablet 2 in the morning and 1 in the evening 09/06/12   Cassell Clement, MD  metoprolol succinate (TOPROL-XL) 25 MG 24 hr tablet Take 1 tablet (25 mg  total) by mouth daily. 08/25/11   Cassell Clement, MD  nitroGLYCERIN (NITROSTAT) 0.4 MG SL tablet Place 0.4 mg under the tongue every 5 (five) minutes as needed.      Historical Provider, MD  PARoxetine (PAXIL) 20 MG tablet Take 1 tablet (20 mg total) by mouth every morning. 01/27/12   Cassell Clement, MD  rosuvastatin (CRESTOR) 20 MG tablet 1/2 daily or as directed 01/30/13   Cassell Clement, MD   Physical Exam: Filed Vitals:   02/24/13 0628  BP: 96/78  Pulse: 72  Temp: 98.4 F (36.9 C)  Resp: 18     General:  Snoring, NAD  Eyes: closed  ENT: wnl  Neck: supple  Cardiovascular: rrr  Respiratory: clear  anterior  Abdomen: +Bs, soft  Skin: no rashes or lesions  Musculoskeletal:   Psychiatric: no SI/no HI  Neurologic: no focal deficit  Labs on Admission:  Basic Metabolic Panel: No results found for this basename: NA, K, CL, CO2, GLUCOSE, BUN, CREATININE, CALCIUM, MG, PHOS,  in the last 168 hours Liver Function Tests: No results found for this basename: AST, ALT, ALKPHOS, BILITOT, PROT, ALBUMIN,  in the last 168 hours No results found for this basename: LIPASE, AMYLASE,  in the last 168 hours No results found for this basename: AMMONIA,  in the last 168 hours CBC: No results found for this basename: WBC, NEUTROABS, HGB, HCT, MCV, PLT,  in the last 168 hours Cardiac Enzymes: No results found for this basename: CKTOTAL, CKMB, CKMBINDEX, TROPONINI,  in the last 168 hours  BNP (last 3 results) No results found for this basename: PROBNP,  in the last 8760 hours CBG: No results found for this basename: GLUCAP,  in the last 168 hours  Radiological Exams on Admission: No results found.    Assessment/Plan Active Problems:   Type II or unspecified type diabetes mellitus without mention of complication, uncontrolled   Benign hypertensive heart disease without heart failure   Hx of CABG   Carotid stenosis, asymptomatic   Headache(784.0)   Headache:  Per neuro we will Attempt abortive therapy with depakote, compazine, benadryl and magnesium, check sed rate  DM- SSI, home medications  H/o carotid stenosis- follows with vascular  HTN- taper medications to control blood pressure  neuro  Code Status: full Family Communication: husband Disposition Plan: admit  Time spent: 70 min  Betty Daidone [facility] Pager [phone]  If 7PM-7AM, please contact night-coverage [email or web address] Password [facility] 02/24/2013, 9:33 AM

## 2013-02-24 NOTE — Consult Note (Addendum)
Neurology Consultation Reason for Consult: Headache Referring Physician: Houston Siren  CC: Headache  History is obtained from: Patient  HPI: Danielle Farrell is a 57 y.o. female presenting with severe headache. She has a history of lightheadedness that occurs primarily after riding in a car for a long period and then standing up. 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  Tonight, she noticed that her vision out of her left side with slightly blurry. Approximately 10 - 15 minutes later, she felt a headache "coming on" and therefore went to lay down. She subsequently had development of a severe throbbing unilateral headache is retro-orbital in location. She is unclear over what time frame the pain developed, but definitely had visual symptoms 10 - 15 minutes prior. It is associated with photophobia as well as nausea. She has had similar episodes in the past, but was able to take analgesics and keep them down, but this time she was unable to keep analgesics on her stomach and therefore presented to the emergency room.   ROS: A 14 point ROS was performed and is negative except as noted in the HPI.  Past Medical History  Diagnosis Date  . Hyperlipidemia   . IHD (ischemic heart disease)   . PAOD (peripheral arterial occlusive disease)   . Carotid bruit   . Diabetes mellitus   . Hypertension   . PVD (peripheral vascular disease)   . Coronary artery disease   . Dizziness   . Persistent headaches   . Pneumonia Jan. 2014  . Bronchitis Jan 2014    became pneumonia    Family History: No hx migraine  Social History: Tob: previous smoker  Exam: Current vital signs: BP 96/78  Pulse 72  Temp(Src) 98.4 F (36.9 C) (Oral)  Resp 18  Ht 5\' 4"  (1.626 m)  Wt 64.411 kg (142 lb)  BMI 24.36 kg/m2  SpO2 96% Vital signs in last 24 hours: Temp:  [98.4 F (36.9 C)] 98.4 F (36.9 C) (08/23 0628) Pulse Rate:  [72] 72  (08/23 0628) Resp:  [18] 18 (08/23 0628) BP: (96)/(78) 96/78 mmHg (08/23 0628) SpO2:  [96 %] 96 % (08/23 0628) Weight:  [64.411 kg (142 lb)] 64.411 kg (142 lb) (08/23 0628)  General: in bed, NAD CV: RRR Mental Status: Patient is awake, alert, oriented to person, place, month, year, and situation. Immediate and remote memory are intact. Patient is able to give a clear and coherent history. No signs of aphasia or neglect.  Cranial Nerves: II: Visual Fields are full. Pupils are unequal Left smaller than right, both are round, and reactive to light.  Discs are difficult to visualize. III,IV, VI: EOMI without diploplia. Ptosis left eye V: Facial sensation is symmetric to temperature VII: Facial movement is symmetric.  VIII: hearing is intact to voice X: Uvula elevates symmetrically XI: Shoulder shrug is symmetric. XII: tongue is midline without atrophy or fasciculations.  Motor: Tone is normal. Bulk is normal. 5/5 strength was present in all four extremities.  Sensory: Sensation is symmetric to light touch and temperature in the arms and legs. Deep Tendon Reflexes: 2+ and symmetric in the biceps and patellae.  Cerebellar: FNF intact bilaterally Gait: Not tested due to patient discomfort   I have reviewed the images obtained:CT head - no acute abnormality.   Impression: 57 yo F with a unilateral severe throbbing headache with visual aura prior to onset. The preceding visual aura, unilateral  nature, and similarity to previous episodes are all consistent with migraine headache. I do not feel that [facility] is likely given the preceding visual aura and therefore do not feel that an LP is necessary at this time. The similarity to previous events makes me think that dissection is unlikely as well.   Recommendations: 1) Attempt abortive therapy with depakote, compazine, benadryl and magnesium.  2) will follow for treatment effect.   Ritta Slot, MD [facility] [phone]  If 7pm- 7am, please page neurology on call at [phone].

## 2013-02-25 DIAGNOSIS — I6529 Occlusion and stenosis of unspecified carotid artery: Secondary | ICD-10-CM

## 2013-02-25 DIAGNOSIS — E7800 Pure hypercholesterolemia, unspecified: Secondary | ICD-10-CM

## 2013-02-25 DIAGNOSIS — I119 Hypertensive heart disease without heart failure: Secondary | ICD-10-CM

## 2013-02-25 DIAGNOSIS — R51 Headache: Secondary | ICD-10-CM

## 2013-02-25 LAB — BASIC METABOLIC PANEL
BUN: 13 mg/dL (ref 6–23)
Calcium: 9.9 mg/dL (ref 8.4–10.5)
Creatinine, Ser: 0.79 mg/dL (ref 0.50–1.10)
GFR calc non Af Amer: >90 mL/min (ref >90)
Glucose, Bld: 143 mg/dL — ABNORMAL HIGH (ref 70–99)

## 2013-02-25 LAB — CBC
Hemoglobin: 15.2 g/dL — ABNORMAL HIGH (ref 12.0–15.0)
MCH: 31.3 pg (ref 26.0–34.0)
MCHC: 36 g/dL (ref 30.0–36.0)
Platelets: 245 10*3/uL (ref 150–400)
RDW: 13.2 % (ref 11.5–15.5)

## 2013-02-25 LAB — GLUCOSE, CAPILLARY

## 2013-02-25 MED ORDER — TEMAZEPAM 7.5 MG PO CAPS: 7.5000 mg | ORAL_CAPSULE | Freq: Every evening | ORAL | Status: DC | PRN

## 2013-02-25 MED ORDER — LISINOPRIL 10 MG PO TABS
10.0000 mg | ORAL_TABLET | Freq: Every day | ORAL | Status: DC
  Administered 2013-02-25: 10 mg via ORAL
  Filled 2013-02-25: qty 1

## 2013-02-25 MED ORDER — LISINOPRIL 10 MG PO TABS: 10.0000 mg | ORAL_TABLET | Freq: Every day | ORAL | Status: DC

## 2013-02-25 NOTE — Discharge Summary (Signed)
Physician Discharge Summary  ATENA SAYRE KVQ:[id number] DOB: Nov 13, 1955 DOA: 02/24/2013  PCP: Kaleen Mask, MD  Admit date: 02/24/2013 Discharge date: 02/25/2013  Time spent: 35 minutes  Recommendations for Outpatient Follow-up:  1. BMP 4-6 weeks re K+  Discharge Diagnoses:  Active Problems:   Type II or unspecified type diabetes mellitus without mention of complication, uncontrolled   Benign hypertensive heart disease without heart failure   Hx of CABG   Carotid stenosis, asymptomatic   Headache(784.0)   Discharge Condition: improved  Diet recommendation: diabetic/cardiac  Filed Weights   02/24/13 0628  Weight: 64.411 kg (142 lb)    History of present illness:  Danielle Farrell is a 57 y.o. female  Who was brought from [facility] ER. History obtained from chart as patient just received pain medications.  She presented with severe headache. She has a history of lightheadedness that occurs primarily after riding in a car for a long period and then standing up. 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  Tonight, she noticed that her vision out of her left side with slightly blurry. Approximately 10 - 15 minutes later, she felt a headache "coming on" and therefore went to lay down. She subsequently had development of a severe throbbing unilateral headache is retro-orbital in location. She is unclear over what time frame the pain developed, but definitely had visual symptoms 10 - 15 minutes prior. It is associated with photophobia as well as nausea. She has had similar episodes in the past, but was able to take analgesics and keep them down, but this time she was unable to keep analgesics on her stomach and therefore presented to the emergency room   Hospital Course:  Migraine: given abortive therapy with depakote, compazine, benadryl and  magnesium   Procedures:  none  Consultations: Neuro  Discharge Exam: Filed Vitals:   02/25/13 0916  BP: 113/69  Pulse: 65  Temp: 98.1 F (36.7 C)  Resp: 20    General: A+Ox3, NAD Cardiovascular: rrr Respiratory: clear anterior  Discharge Instructions  Discharge Orders   Future Appointments Provider Department Dept Phone   08/22/2013 9:00 AM [facility] [phone]   08/22/2013 10:00 AM Carma Lair Nickel, NP [facility] [phone]   Future Orders Complete By Expires   Diet - low sodium heart healthy  As directed    Diet Carb Modified  As directed    Discharge instructions  As directed    Comments:     BMP 6 weeks   Increase activity slowly  As directed        Medication List    STOP taking these medications       ADVIL PM PO     nitroGLYCERIN 0.4 MG SL tablet  Commonly known as:  NITROSTAT      TAKE these medications       aspirin 81 MG EC tablet  Take 81 mg by mouth daily.     calcium carbonate 600 MG Tabs tablet  Commonly known as:  OS-CAL  Take 600 mg by mouth 2 (two) times daily with a meal.     glipiZIDE 5 MG tablet  Commonly known as:  GLUCOTROL  Take 2 tablets (10 mg total) by mouth 2 (two) times daily before a meal.     lisinopril 10 MG tablet  Commonly known as:  PRINIVIL,ZESTRIL  Take 1 tablet (10  mg total) by mouth daily.     metFORMIN 500 MG tablet  Commonly known as:  GLUCOPHAGE  Take 500-1,000 mg by mouth 2 (two) times daily with a meal. 2 tablets in the morning; 1 tablet in the evening     metoprolol succinate 25 MG 24 hr tablet  Commonly known as:  TOPROL-XL  Take 1 tablet (25 mg total) by mouth daily.     PARoxetine 20 MG tablet  Commonly known as:  PAXIL  Take 1 tablet (20 mg total) by mouth every morning.     rosuvastatin 10 MG tablet  Commonly known as:  CRESTOR  Take 10 mg by mouth daily.       Allergies  Allergen Reactions  .  Erythromycin Nausea And Vomiting  . Lipitor [Atorvastatin Calcium]     Muscle cramps in legs      The results of significant diagnostics from this hospitalization (including imaging, microbiology, ancillary and laboratory) are listed below for reference.    Significant Diagnostic Studies: No results found.  Microbiology: No results found for this or any previous visit (from the past 240 hour(s)).   Labs: Basic Metabolic Panel:  Recent Labs Lab 02/24/13 1225 02/25/13 0540  NA 140 139  K 3.3* 3.8  CL 99 101  CO2 26 24  GLUCOSE 126* 143*  BUN 7 13  CREATININE 0.84 0.79  CALCIUM 10.0 9.9   Liver Function Tests:  Recent Labs Lab 02/24/13 1225  AST 15  ALT 13  ALKPHOS 61  BILITOT 0.4  PROT 7.5  ALBUMIN 4.1   No results found for this basename: LIPASE, AMYLASE,  in the last 168 hours No results found for this basename: AMMONIA,  in the last 168 hours CBC:  Recent Labs Lab 02/24/13 1225 02/25/13 0540  WBC 10.5 8.3  HGB 14.1 15.2*  HCT 38.5 42.2  MCV 85.4 86.8  PLT 236 245   Cardiac Enzymes: No results found for this basename: CKTOTAL, CKMB, CKMBINDEX, TROPONINI,  in the last 168 hours BNP: BNP (last 3 results) No results found for this basename: PROBNP,  in the last 8760 hours CBG:  Recent Labs Lab 02/24/13 1156 02/24/13 1646 02/24/13 2121 02/25/13 0638  GLUCAP 135* 91 125* 159*       Signed:  Isaiha Asare  [facility] 02/25/2013, 9:20 AM

## 2013-02-25 NOTE — Progress Notes (Signed)
Subjective: Severe headache resolved late morning on 02/24/2013. Only complaint is a slight pressure sensation behind her right eye when she coughs.  Objective: Current vital signs: BP 113/69  Pulse 65  Temp(Src) 98.1 F (36.7 C) (Oral)  Resp 20  Ht 5\' 4"  (1.626 m)  Wt 64.411 kg (142 lb)  BMI 24.36 kg/m2  SpO2 96%  Neurologic Exam: Alert and in no acute distress. Mental status was normal. Speech was normal. Patient moved extremities equally with good strength.  Medications: I have reviewed the patient's current medications.  Assessment/Plan: Probable complicated migraine headache, resolved.  No further neurological intervention is indicated at this point. I will sign off on her care, but remain available for followup evaluation if indicated.  C.R. Roseanne Reno, MD [facility] [phone]  02/25/2013  9:41 AM

## 2013-02-25 NOTE — Progress Notes (Signed)
Pt d/c to home by car with family. Assessment stable. Prescriptions given. Pt verbalizes understanding of d/c instructions. 

## 2013-06-04 HISTORY — PX: TRANSTHORACIC ECHOCARDIOGRAM: SHX275

## 2013-06-08 ENCOUNTER — Inpatient Hospital Stay (HOSPITAL_COMMUNITY): Payer: Medicare Other

## 2013-06-08 ENCOUNTER — Encounter (HOSPITAL_COMMUNITY): Payer: Self-pay | Admitting: Internal Medicine

## 2013-06-08 ENCOUNTER — Inpatient Hospital Stay (HOSPITAL_COMMUNITY)
Admission: AD | Admit: 2013-06-08 | Discharge: 2013-06-09 | DRG: 102 | Disposition: A | Discharge status: Home / Self Care | Payer: Medicare Other | Source: Other Acute Inpatient Hospital | Attending: Internal Medicine | Admitting: Internal Medicine

## 2013-06-08 DIAGNOSIS — I739 Peripheral vascular disease, unspecified: Secondary | ICD-10-CM | POA: Diagnosis present

## 2013-06-08 DIAGNOSIS — G9349 Other encephalopathy: Secondary | ICD-10-CM | POA: Diagnosis present

## 2013-06-08 DIAGNOSIS — Z951 Presence of aortocoronary bypass graft: Secondary | ICD-10-CM

## 2013-06-08 DIAGNOSIS — E1169 Type 2 diabetes mellitus with other specified complication: Secondary | ICD-10-CM | POA: Diagnosis present

## 2013-06-08 DIAGNOSIS — Z79899 Other long term (current) drug therapy: Secondary | ICD-10-CM

## 2013-06-08 DIAGNOSIS — I2510 Atherosclerotic heart disease of native coronary artery without angina pectoris: Secondary | ICD-10-CM | POA: Diagnosis present

## 2013-06-08 DIAGNOSIS — R519 Headache, unspecified: Secondary | ICD-10-CM | POA: Diagnosis present

## 2013-06-08 DIAGNOSIS — G4700 Insomnia, unspecified: Secondary | ICD-10-CM

## 2013-06-08 DIAGNOSIS — E7800 Pure hypercholesterolemia, unspecified: Secondary | ICD-10-CM

## 2013-06-08 DIAGNOSIS — I10 Essential (primary) hypertension: Secondary | ICD-10-CM | POA: Diagnosis present

## 2013-06-08 DIAGNOSIS — F17200 Nicotine dependence, unspecified, uncomplicated: Secondary | ICD-10-CM | POA: Diagnosis present

## 2013-06-08 DIAGNOSIS — I119 Hypertensive heart disease without heart failure: Secondary | ICD-10-CM

## 2013-06-08 DIAGNOSIS — D72829 Elevated white blood cell count, unspecified: Secondary | ICD-10-CM | POA: Diagnosis present

## 2013-06-08 DIAGNOSIS — IMO0001 Reserved for inherently not codable concepts without codable children: Secondary | ICD-10-CM

## 2013-06-08 DIAGNOSIS — E785 Hyperlipidemia, unspecified: Secondary | ICD-10-CM | POA: Diagnosis present

## 2013-06-08 DIAGNOSIS — G9340 Encephalopathy, unspecified: Secondary | ICD-10-CM

## 2013-06-08 DIAGNOSIS — I6529 Occlusion and stenosis of unspecified carotid artery: Secondary | ICD-10-CM | POA: Diagnosis present

## 2013-06-08 DIAGNOSIS — Z7982 Long term (current) use of aspirin: Secondary | ICD-10-CM

## 2013-06-08 DIAGNOSIS — E119 Type 2 diabetes mellitus without complications: Secondary | ICD-10-CM | POA: Diagnosis present

## 2013-06-08 DIAGNOSIS — G43809 Other migraine, not intractable, without status migrainosus: Principal | ICD-10-CM | POA: Diagnosis present

## 2013-06-08 DIAGNOSIS — R51 Headache: Secondary | ICD-10-CM

## 2013-06-08 LAB — CBC WITH DIFFERENTIAL/PLATELET
Basophils Absolute: 0 10*3/uL (ref 0.0–0.1)
Basophils Relative: 0 % (ref 0–1)
Eosinophils Relative: 0 % (ref 0–5)
HCT: 36.6 % (ref 36.0–46.0)
Hemoglobin: 12.9 g/dL (ref 12.0–15.0)
Lymphocytes Relative: 19 % (ref 12–46)
Lymphs Abs: 1.9 10*3/uL (ref 0.7–4.0)
MCH: 30.6 pg (ref 26.0–34.0)
MCHC: 35.2 g/dL (ref 30.0–36.0)
MCV: 86.9 fL (ref 78.0–100.0)
Monocytes Absolute: 0.9 10*3/uL (ref 0.1–1.0)
Monocytes Relative: 8 % (ref 3–12)
Neutro Abs: 7.3 10*3/uL (ref 1.7–7.7)
Neutrophils Relative %: 72 % (ref 43–77)
RBC: 4.21 MIL/uL (ref 3.87–5.11)
WBC: 10.1 10*3/uL (ref 4.0–10.5)

## 2013-06-08 LAB — GLUCOSE, CAPILLARY
Glucose-Capillary: 273 mg/dL — ABNORMAL HIGH (ref 70–99)
Glucose-Capillary: 70 mg/dL (ref 70–99)
Glucose-Capillary: 124 mg/dL — ABNORMAL HIGH (ref 70–99)
Glucose-Capillary: 100 mg/dL — ABNORMAL HIGH (ref 70–99)

## 2013-06-08 LAB — BASIC METABOLIC PANEL
BUN: 11 mg/dL (ref 6–23)
Calcium: 9.4 mg/dL (ref 8.4–10.5)
Chloride: 96 mEq/L (ref 96–112)
Creatinine, Ser: 0.72 mg/dL (ref 0.50–1.10)
GFR calc Af Amer: >90 mL/min (ref >90)
GFR calc non Af Amer: >90 mL/min (ref >90)
Glucose, Bld: 188 mg/dL — ABNORMAL HIGH (ref 70–99)
Potassium: 3.7 mEq/L (ref 3.5–5.1)

## 2013-06-08 LAB — AMMONIA: Ammonia: 31 umol/L (ref 11–60)

## 2013-06-08 LAB — BLOOD GAS, ARTERIAL
Drawn by: 347641
TCO2: 25.9 mmol/L (ref 0–100)
pCO2 arterial: 34.7 mmHg — ABNORMAL LOW (ref 35.0–45.0)
pH, Arterial: 7.468 — ABNORMAL HIGH (ref 7.350–7.450)
pO2, Arterial: 74.5 mmHg — ABNORMAL LOW (ref 80.0–100.0)

## 2013-06-08 LAB — HEPATIC FUNCTION PANEL
AST: 17 U/L (ref 0–37)
Bilirubin, Direct: <0.1 mg/dL (ref 0.0–0.3)
Total Protein: 7.5 g/dL (ref 6.0–8.3)

## 2013-06-08 LAB — TROPONIN I: Troponin I: <0.3 ng/mL (ref <0.30)

## 2013-06-08 LAB — LACTIC ACID, PLASMA: Lactic Acid, Venous: 0.9 mmol/L (ref 0.5–2.2)

## 2013-06-08 LAB — SALICYLATE LEVEL: Salicylate Lvl: <2 mg/dL — ABNORMAL LOW (ref 2.8–20.0)

## 2013-06-08 LAB — VITAMIN B12: Vitamin B-12: 313 pg/mL (ref 211–911)

## 2013-06-08 LAB — CK: Total CK: 116 U/L (ref 7–177)

## 2013-06-08 MED ORDER — HYDRALAZINE HCL 20 MG/ML IJ SOLN: 10.0000 mg | INTRAMUSCULAR | Status: DC | PRN

## 2013-06-08 MED ORDER — SODIUM CHLORIDE 0.9 % IJ SOLN
3.0000 mL | Freq: Two times a day (BID) | INTRAMUSCULAR | Status: DC
  Administered 2013-06-08 – 2013-06-09 (×2): 3 mL via INTRAVENOUS

## 2013-06-08 MED ORDER — GADOBENATE DIMEGLUMINE 529 MG/ML IV SOLN
13.0000 mL | Freq: Once | INTRAVENOUS | Status: AC | PRN
  Administered 2013-06-08: 13 mL via INTRAVENOUS

## 2013-06-08 MED ORDER — SODIUM CHLORIDE 0.9 % IV SOLN
INTRAVENOUS | Status: AC
  Administered 2013-06-08: 09:00:00 via INTRAVENOUS

## 2013-06-08 MED ORDER — ONDANSETRON HCL 4 MG PO TABS: 4.0000 mg | ORAL_TABLET | Freq: Four times a day (QID) | ORAL | Status: DC | PRN

## 2013-06-08 MED ORDER — INSULIN ASPART 100 UNIT/ML ~~LOC~~ SOLN
0.0000 [IU] | Freq: Three times a day (TID) | SUBCUTANEOUS | Status: DC
  Administered 2013-06-09: 1 [IU] via SUBCUTANEOUS

## 2013-06-08 MED ORDER — ONDANSETRON HCL 4 MG/2ML IJ SOLN: 4.0000 mg | Freq: Four times a day (QID) | INTRAMUSCULAR | Status: DC | PRN

## 2013-06-08 NOTE — Consult Note (Signed)
Neurohospitalist Consult Note:  Date: 06/08/2013               Patient Name:  Danielle Farrell MRN: [id number]  DOB: 1956-03-24 Age / Sex: 57 y.o., female   PCP: Kaleen Mask, MD         Requesting Physician: Dr. 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  She has also had aortobifemoral bypass graft for LE claudication (2002), CABG (2002), and hospital admission Aug 2014 for complicated migraine.  She presented as transfer from [facility] with reports of acute onset of nausea, vomiting, and confusion while experiencing a severe headache yesterday evening. At outside hospital patient was given Ativan and Dilaudid with recommendation for Lumbar puncture.  Danielle Farrell requested transfer to [facility] for further evaluation.  Meds: Current Facility-Administered Medications  Medication Dose Route Frequency Provider Last Rate Last Dose  . 0.9 %  sodium chloride infusion   Intravenous Continuous Eduard Clos, MD 50 mL/hr at 06/08/13 [id number]    . hydrALAZINE (APRESOLINE) injection 10 mg  10 mg Intravenous Q4H PRN Eduard Clos, MD      . insulin aspart (novoLOG) injection 0-9 Units  0-9 Units Subcutaneous TID WC Eduard Clos, MD      . ondansetron [facility]) tablet 4 mg  4 mg Oral Q6H PRN Eduard Clos, MD       Or  . ondansetron [facility]) injection 4 mg  4 mg Intravenous Q6H PRN Eduard Clos, MD      . sodium chloride 0.9 % injection 3 mL  3 mL Intravenous Q12H Eduard Clos, MD        Allergies: Allergies as of 06/08/2013 - Review Complete 06/08/2013   Allergen Reaction Noted  . Erythromycin Nausea And Vomiting 02/24/2013  . Lipitor [atorvastatin calcium]  02/04/2011   Past Medical History  Diagnosis Date  . Hyperlipidemia   . IHD (ischemic heart disease)   . PAOD (peripheral arterial occlusive disease)   . Carotid bruit   . Diabetes mellitus   . Hypertension   . PVD (peripheral vascular disease)   . Coronary artery disease   . Dizziness   . Persistent headaches   . Pneumonia Jan. 2014  . Bronchitis Jan 2014    became pneumonia   Past Surgical History  Procedure Laterality Date  . Cardiac catheterization  04/28/2001  . 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 US echocardiography  07/16/2004    EF 55-60%  . Cardiovascular stress test  11/23/2006    EF 64%  . Carotid-subclavian bypass graft  2003   Family History  Problem Relation Age of Onset  . Cancer Mother   . Cancer Father    History   Social History  . Marital Status: Single    Spouse Name: N/A    Number of Children: N/A  . Years of Education: N/A   Occupational History  . Not on file.   Social History Main Topics  . Smoking status: Current Every Day Smoker -- 0.50 packs/day    Types: Cigarettes  . Smokeless tobacco: Never  Used  . Alcohol Use: No  . Drug Use: No  . Sexual Activity: Not on file   Other Topics Concern  . Not on file   Social History Narrative  . No narrative on file    Review of Systems: per Farrell and chart review   Constitutional:  Denies fever, chills, diaphoresis, appetite change and fatigue.  HEENT: Denies photophobia, eye pain,   Respiratory: Denies SOB, DOE, cough, chest tightness, and wheezing.  Cardiovascular: Denies chest pain, palpitations and leg swelling.  Gastrointestinal: Endorses nausea, vomiting, Denies abdominal pain, diarrhea  Genitourinary: Denies dysuria  Musculoskeletal: Denies myalgias  Skin: Denies  rash and wound.  Neurological: Endorses headache, confusion Denies seizures, syncope, weakness     Physical Exam: Blood pressure 101/55, pulse 88, temperature 98.2 F (36.8 C), temperature source Axillary, height 5\' 4"  (1.626 m), weight 136 lb 11 oz (62 kg), SpO2 94.00%.  General: Well-developed, well-nourished, laying on side in bed with Farrell present Head: Normocephalic, atraumatic. Eyes: PERRLA, EOMI, Nose: Moist mucous membranes, no purulence, normal turbinates Throat: Oropharynx nonerythematous, no exudate appreciated.  Neck: supple, no masses, no carotid Bruits, no JVD appreciated. Lungs: Normal respiratory effort. Clear to auscultation bilaterally from apices to bases without crackles or wheezes appreciated. Heart: normal rate, regular rhythm, normal S1 and S2, no gallop, murmur, or rubs appreciated. Abdomen: BS normoactive. Soft, Nondistended, non-tender. No masses or organomegaly appreciated. Extremities: No pretibial edema, distal pulses intact Neurologic: sedated, not following commands Tone and bulk:normal tone throughout; no atrophy noted  Sensory: withdraws to pain Deep Tendon Reflexes:  Right: Upper Extremity   Left: Upper extremity  Brachioradialis (C6) 2/4  Brachioradialis (C6) 2/4 Lower Extremity    Lower Extremity  Achilles (S1) 2/4   Achilles (S1) 2/4  Plantars: Right: downgoing   Left: downgoing  Gait: sedated thus deferred CV: pulses palpable throughout    Lab results: Basic Metabolic Panel:  Recent Labs  91/47/82 0740  NA 134*  K 3.7  CL 96  CO2 23  GLUCOSE 188*  BUN 11  CREATININE 0.72  CALCIUM 9.4   Liver Function Tests:  Recent Labs  06/08/13 0740  AST 17  ALT 12  ALKPHOS 61  BILITOT 0.2*  PROT 7.5  ALBUMIN 3.9    Recent Labs  06/08/13 0740  AMMONIA 31   CBC:  Recent Labs  06/08/13 0740  WBC 10.1  NEUTROABS 7.3  HGB 12.9  HCT 36.6  MCV 86.9  PLT 242   Cardiac Enzymes:  Recent Labs  06/08/13 0740  CKTOTAL 116  TROPONINI <0.30   CBG:  Recent Labs  06/08/13 0522 06/08/13 0854  GLUCAP 273* 137*      Imaging results:  No results found.  Other results: EKG: pending.  A/P: Danielle Farrell is a 57 yo female with substantial vascular disease including right carotid stenosis of at least 70%, s/p CABG, s/p aortofem bypass, complicated migraines, diabetes and hypertension admitted with headaches, nausea, vomiting, and confusion.  #1 Altered mental status: current work-up for metabolic and infectious etiology underway.  Danielle without leukocytosis on admission but reported at outside hospital and had been given ceftriaxone x 1.  There is concern for vascular cause given her significant vasculopathy.  CT of head at outside hospital negative.  Would strongly recommend MRA of neck and head for more conclusive evaluation. -cont current metabolic and infectious work up per Primary Team -MRA neck/head -would be conservative with sedatives for better assessment of mental status  Recent Labs Lab 06/08/13 0740  WBC 10.1     Further recommendations per Neurology attending  Signed: Manuela Schwartz, MD 06/08/2013, 11:16 AM   Patient seen and examined together with internal medicine resident and I concur with the assessment and plan.  Wyatt Portela, MD

## 2013-06-08 NOTE — H&P (Addendum)
[facility] History and Physical  MARGHERITA HOSP KNL:[id number] DOB: 1956-04-22 DOA: 06/08/2013  Referring physician: Patient was transferred from [facility]. PCP: Kaleen Mask, MD   Chief Complaint: Altered mental status.  History obtained from patient's husband.  HPI: Danielle Farrell is a 57 y.o. female with history of CAD status post CABG, diabetes mellitus type 2, hypertension, carotid artery disease status post bypass for steal syndrome was taken to the ER at [facility] because of altered mental status and agitation. As per the husband patient was doing fine and both husband and the patient had supper last evening around 4:00 following which patient had some headache and she took some unknown pills which patient's husband states may be naproxen for her headache. Has now following which patient started getting nauseated and vomited multiple times and patient became confused and agitated. Patient was taken to the ER. In the ER patient was given some Dilaudid and Ativan. CT head was done which was negative for anything acute. Labs showed mild leukocytosis otherwise bicarbonate was 24 LFTs were normal, Tylenol levels are negative. ED physician wanted lumbar puncture done but patient's husband wanted patient to be transferred to [facility] for further workup. Neurologist on-call and hospitalist on call contacted and patient was transferred to [facility]. On my exam patient is lethargic but minimally arousable but does not follow commands but not in acute distress and is not in short of breath. As per the husband patient was doing fine prior to the incident. Patient did not have any recent travels or did not have any fever chills insect bites or any animal contacts. Did not have any rash. As per patient's husband patient was not suicidal or homicidal.  Review of Systems: As presented in the history of presenting illness, rest negative.  Past Medical History  Diagnosis Date  .  Hyperlipidemia   . IHD (ischemic heart disease)   . PAOD (peripheral arterial occlusive disease)   . Carotid bruit   . Diabetes mellitus   . Hypertension   . PVD (peripheral vascular disease)   . Coronary artery disease   . Dizziness   . Persistent headaches   . Pneumonia Jan. 2014  . Bronchitis Jan 2014    became pneumonia   Past Surgical History  Procedure Laterality Date  . Cardiac catheterization  04/28/2001  . 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 US echocardiography  07/16/2004    EF 55-60%  . Cardiovascular stress test  11/23/2006    EF 64%  . Carotid-subclavian bypass graft  2003   Social History:  reports that she has been smoking Cigarettes.  She has been smoking about 0.50 packs per day. She has never used smokeless tobacco. She reports that she does not drink alcohol or use illicit drugs. Where does patient live home. Can patient participate in ADLs? Yes.  Allergies  Allergen Reactions  . Erythromycin Nausea And Vomiting  . Lipitor [Atorvastatin Calcium]     Muscle cramps in legs    Family History:  Family History  Problem Relation Age of Onset  . Cancer Mother   . Cancer Father       Prior to Admission medications   Medication Sig Start Date End Date Taking? Authorizing Provider  aspirin 81 MG EC tablet Take 81 mg by mouth daily.      Historical Provider, MD  calcium carbonate (OS-CAL) 600 MG TABS Take 600 mg by mouth 2 (two) times daily with a meal.  Historical Provider, MD  glipiZIDE (GLUCOTROL) 5 MG tablet Take 2 tablets (10 mg total) by mouth 2 (two) times daily before a meal. 09/06/12   Cassell Clement, MD  lisinopril (PRINIVIL,ZESTRIL) 10 MG tablet Take 1 tablet (10 mg total) by mouth daily. 02/25/13   Joseph Art, DO  metFORMIN (GLUCOPHAGE) 500 MG tablet Take 500-1,000 mg by mouth 2 (two) times daily with a meal. 2 tablets in the morning; 1 tablet in the evening    Historical Provider, MD  metoprolol succinate (TOPROL-XL) 25 MG 24 hr  tablet Take 1 tablet (25 mg total) by mouth daily. 08/25/11   Cassell Clement, MD  PARoxetine (PAXIL) 20 MG tablet Take 1 tablet (20 mg total) by mouth every morning. 01/27/12   Cassell Clement, MD  rosuvastatin (CRESTOR) 10 MG tablet Take 10 mg by mouth daily.    Historical Provider, MD  temazepam (RESTORIL) 7.5 MG capsule Take 1 capsule (7.5 mg total) by mouth at bedtime as needed for sleep. 02/25/13   Joseph Art, DO    Physical Exam: Filed Vitals:   06/08/13 0452  BP: 101/55  Pulse: 88  Temp: 98.2 F (36.8 C)  TempSrc: Axillary  Height: 5\' 4"  (1.626 m)  Weight: 62 kg (136 lb 11 oz)  SpO2: 94%     General:  Well developed and moderately nourished.  Eyes: Anicteric no pallor.  ENT: No discharge from the ears eyes nose mouth.  Neck: No neck rigidity.  Cardiovascular: S1-S2 heard.  Respiratory: No rhonchi no crepitations.  Abdomen: Soft nontender bowel sounds present.  Skin: No obvious rash.  Musculoskeletal: No joint swellings or peripheral edema.  Psychiatric: Patient is lethargic.  Neurologic: Patient is lethargic and does not follow commands. Moves all extremities.  Labs on Admission:  Basic Metabolic Panel: No results found for this basename: NA, K, CL, CO2, GLUCOSE, BUN, CREATININE, CALCIUM, MG, PHOS,  in the last 168 hours Liver Function Tests: No results found for this basename: AST, ALT, ALKPHOS, BILITOT, PROT, ALBUMIN,  in the last 168 hours No results found for this basename: LIPASE, AMYLASE,  in the last 168 hours No results found for this basename: AMMONIA,  in the last 168 hours CBC: No results found for this basename: WBC, NEUTROABS, HGB, HCT, MCV, PLT,  in the last 168 hours Cardiac Enzymes: No results found for this basename: CKTOTAL, CKMB, CKMBINDEX, TROPONINI,  in the last 168 hours  BNP (last 3 results) No results found for this basename: PROBNP,  in the last 8760 hours CBG:  Recent Labs Lab 06/08/13 0522  GLUCAP 273*     Radiological Exams on Admission: No results found.   Assessment/Plan Principal Problem:   Acute encephalopathy Active Problems:   Type II or unspecified type diabetes mellitus without mention of complication, uncontrolled   Hx of CABG   Hypercholesterolemia   Headache(784.0)   1. Acute encephalopathy - patient is afebrile. Labs done at [facility] shows leukocytosis and patient was given one dose of ceftriaxone and has been transferred to [facility]. At this time I am repeating labs including LFTs, metabolic panel, CBC, UA, urine drug screen, Tylenol levels, salicylate levels, ammonia, TSH, CK, troponins and get Chest Xray. I also consulted neurologist for further recommendations. Presently I have not placed patient on any antibiotics. 2. Diabetes mellitus type 2 - since patient is n.p.o. we will closely follow CBGs every 4 hours with sliding-scale coverage. 3. Hypertension - patient is placed on when necessary IV hydralazine for systolic blood pressure more  than 160. 4. CAD status post CABG - check CK and comments and EKG. 5. Hyperlipidemia - continue statins when patient can take orally.    Code Status: Full code.  Family Communication: Patient's husband at the bedside.  Disposition Plan: Admit to inpatient.    KAKRAKANDY,ARSHAD N. [facility] Pager [phone].  If 7PM-7AM, please contact night-coverage [email or web address] Password [facility] 06/08/2013, 6:57 AM

## 2013-06-08 NOTE — Progress Notes (Signed)
EEG Completed; Results Pending  

## 2013-06-08 NOTE — Progress Notes (Signed)
Patient ID: Danielle Farrell  female  EXB:[id number]    DOB: Aug 05, 1955    DOA: 06/08/2013  PCP: Kaleen Mask, MD  Assessment/Plan: Principal Problem:   Acute encephalopathy; patient was recently started on Naprosyn and Reglan by PCP, had taken both yesterday before this event. - Unclear etiology, could be medication effect versus TIA versus metabolic/infectious encephalopathy, seizures, patient seen twice today, in a.m. was groggy and unable to obtain any history from the patient. Examined again at 2:20 PM, alert and awake and no focal neurological deficits noted, feels hungry and asking for food. - Discussed with neurology, ordered MRI of the brain, MRA head and neck, EEG -  [place] patient's [place] ,  Borrelia Ab, RMSF titer, ESR, B12, folate, CRP - Ammonia level, TSH level within normal limits    Active Problems:   Type II or unspecified type diabetes mellitus without mention of complication, uncontrolled - Start on carb modified diet and sliding scale    Hx of CABG   Hypercholesterolemia   Headache(784.0): Improved    family communication: Discussed with patient's husband at the bedside  DVT Prophylaxis:  Code Status:  Disposition: TBD   Subjective: Patient seen twice today, now alert and awake and oriented, no dysarthria   Objective: Weight change:  No intake or output data in the 24 hours ending 06/08/13 1426 Blood pressure 106/57, pulse 75, temperature 98.3 F (36.8 C), temperature source Axillary, height 5\' 4"  (1.626 m), weight 62 kg (136 lb 11 oz), SpO2 94.00%.  Physical Exam: General: Alert and awake, oriented x3, not in any acute distress. CVS: S1-S2 clear, no murmur rubs or gallops Chest: clear to auscultation bilaterally, no wheezing, rales or rhonchi Abdomen: soft nontender, nondistended, normal bowel sounds  Extremities: no cyanosis, clubbing or edema noted bilaterally Neuro: Cranial nerves II-XII intact, no focal  neurological deficits  Lab Results: Basic Metabolic Panel:  Recent Labs Lab 06/08/13 0740  NA 134*  K 3.7  CL 96  CO2 23  GLUCOSE 188*  BUN 11  CREATININE 0.72  CALCIUM 9.4   Liver Function Tests:  Recent Labs Lab 06/08/13 0740  AST 17  ALT 12  ALKPHOS 61  BILITOT 0.2*  PROT 7.5  ALBUMIN 3.9   No results found for this basename: LIPASE, AMYLASE,  in the last 168 hours  Recent Labs Lab 06/08/13 0740  AMMONIA 31   CBC:  Recent Labs Lab 06/08/13 0740  WBC 10.1  NEUTROABS 7.3  HGB 12.9  HCT 36.6  MCV 86.9  PLT 242   Cardiac Enzymes:  Recent Labs Lab 06/08/13 0740  CKTOTAL 116  TROPONINI <0.30   BNP: No components found with this basename: POCBNP,  CBG:  Recent Labs Lab 06/08/13 0522 06/08/13 0854 06/08/13 1132  GLUCAP 273* 137* 70     Micro Results: No results found for this or any previous visit (from the past 240 hour(s)).  Studies/Results: No results found.  Medications: Scheduled Meds: . insulin aspart  0-9 Units Subcutaneous TID WC  . sodium chloride  3 mL Intravenous Q12H      LOS: 0 days   Theordore Cisnero M.D. [facility] 06/08/2013, 2:26 PM Pager: [phone]  If 7PM-7AM, please contact night-coverage [email or web address] Password [email or web address]

## 2013-06-09 LAB — CBC
HCT: 39.9 % (ref 36.0–46.0)
Hemoglobin: 13.6 g/dL (ref 12.0–15.0)
MCH: 30.5 pg (ref 26.0–34.0)
MCHC: 34.1 g/dL (ref 30.0–36.0)
WBC: 7.3 10*3/uL (ref 4.0–10.5)

## 2013-06-09 LAB — BASIC METABOLIC PANEL
BUN: 10 mg/dL (ref 6–23)
GFR calc non Af Amer: >90 mL/min (ref >90)
Glucose, Bld: 126 mg/dL — ABNORMAL HIGH (ref 70–99)
Potassium: 3.6 mEq/L (ref 3.5–5.1)

## 2013-06-09 LAB — GLUCOSE, CAPILLARY
Glucose-Capillary: 116 mg/dL — ABNORMAL HIGH (ref 70–99)
Glucose-Capillary: 119 mg/dL — ABNORMAL HIGH (ref 70–99)
Glucose-Capillary: 126 mg/dL — ABNORMAL HIGH (ref 70–99)

## 2013-06-09 MED ORDER — LISINOPRIL 10 MG PO TABS: 10.0000 mg | ORAL_TABLET | Freq: Every day | ORAL | Status: DC

## 2013-06-09 MED ORDER — TOPIRAMATE 25 MG PO TABS: 50.0000 mg | ORAL_TABLET | Freq: Every day | ORAL | Status: DC

## 2013-06-09 MED ORDER — METOPROLOL TARTRATE 50 MG PO TABS: 25.0000 mg | ORAL_TABLET | Freq: Two times a day (BID) | ORAL | Status: DC

## 2013-06-09 NOTE — Procedures (Signed)
EEG report.  Brief clinical history: 57 y/o with confusion and HA. No prior history of frank epileptic seizures.  Technique: this is a 17 channel routine scalp EEG performed at the bedside with bipolar and monopolar montages arranged in accordance to the international 10/20 system of electrode placement. One channel was dedicated to EKG recording.  The study was performed during wakefulness, drowsiness, and stage 2 sleep. No activating procedures performed.  Description:In the wakeful state, the best background consisted of a medium amplitude, posterior dominant, well sustained, symmetric and reactive 10 Hz rhythm.ns. Drowsiness demonstrated dropout of the alpha rhythm. Stage 2 sleep showed symmetric and synchronous sleep spindles without intermixed epileptiform discharges. No focal or generalized epileptiform discharges noted.  No slowing seen.  EKG showed sinus rhythm.  Impression: this is a normal awake and asleep EEG. Please, be aware that a normal EEG does not exclude the possibility of epilepsy.  Clinical correlation is advised.  Wyatt Portela, MD

## 2013-06-09 NOTE — Discharge Summary (Signed)
Physician Discharge Summary  Patient ID: Danielle Farrell MRN: [id number] DOB/AGE: 1955/07/16 57 y.o.  Admit date: 06/08/2013 Discharge date: 06/09/2013  Primary Care Physician:  Kaleen Mask, MD  Final Discharge Diagnoses:   . Acute encephalopathy  Secondary discharge diagnosis . Type II or unspecified type diabetes mellitus  . Hypercholesterolemia . [id number](387.5)  Consults:  Neurology, Dr. Cyril Mourning   Recommendations for Outpatient Follow-up:  Patient was recommended to followup with outpatient neurology, information provided about [facility]. Please follow RMSF and Lyme serology  Allergies:   Allergies  Allergen Reactions  . Erythromycin Nausea And Vomiting  . Lipitor [Atorvastatin Calcium]     Muscle cramps in legs     Discharge Medications:   Medication List    STOP taking these medications       diphenhydrAMINE 25 MG tablet  Commonly known as:  BENADRYL      TAKE these medications       aspirin 81 MG EC tablet  Take 81 mg by mouth daily.     glipiZIDE 5 MG tablet  Commonly known as:  GLUCOTROL  Take 2 tablets (10 mg total) by mouth 2 (two) times daily before a meal.     lisinopril 10 MG tablet  Commonly known as:  PRINIVIL,ZESTRIL  Take 1 tablet (10 mg total) by mouth daily.  Start taking on:  06/10/2013     magnesium oxide 400 MG tablet  Commonly known as:  MAG-OX  Take 400 mg by mouth daily as needed (migraine).     metFORMIN 1000 MG tablet  Commonly known as:  GLUCOPHAGE  Take 1,000 mg by mouth 2 (two) times daily with a meal.     metoprolol 50 MG tablet  Commonly known as:  LOPRESSOR  Take 0.5 tablets (25 mg total) by mouth 2 (two) times daily.  Start taking on:  06/10/2013     naproxen 500 MG tablet  Commonly known as:  NAPROSYN  Take 500 mg by mouth every 12 (twelve) hours as needed (hip pain).     PARoxetine 20 MG tablet  Commonly known as:  PAXIL  Take 1 tablet (20 mg total) by mouth every morning.     prochlorperazine  10 MG tablet  Commonly known as:  COMPAZINE  Take 10 mg by mouth every 12 (twelve) hours as needed for nausea or vomiting.     rosuvastatin 10 MG tablet  Commonly known as:  CRESTOR  Take 10 mg by mouth daily after supper.     valproic acid 250 MG capsule  Commonly known as:  DEPAKENE  Take 250 mg by mouth daily as needed (migraine).         Brief H and P: For complete details please refer to admission H and P, but in brief is a 57 y.o. female with history of CAD status post CABG, diabetes mellitus type 2, hypertension, carotid artery disease status post bypass for steal syndrome was taken to the ER at [facility] because of altered mental status and agitation. As per the husband patient was doing fine and both husband and the patient had supper last evening around 4:00 following which patient had some headache and she took some unknown pills which patient's husband states may be naproxen for her headache. Has now following which patient started getting nauseated and vomited multiple times and patient became confused and agitated. Patient was taken to the ER. In the ER patient was given some Dilaudid and Ativan. CT head was done which was negative  for anything acute. Labs showed mild leukocytosis otherwise bicarbonate was 24 LFTs were normal, Tylenol levels are negative. ED physician wanted lumbar puncture done but patient's husband wanted patient to be transferred to [facility] for further workup. Neurologist on-call and hospitalist on call contacted and patient was transferred to [facility]. On my exam patient is lethargic but minimally arousable but does not follow commands but not in acute distress and is not in short of breath. As per the husband patient was doing fine prior to the incident. Patient did not have any recent travels or did not have any fever chills insect bites or any animal contacts. Did not have any rash. As per patient's husband patient was not suicidal or  homicidal.   Hospital Course:     Acute encephalopathy: patient apparently had taken Naprosyn and Reglan by PCP, but she takes the Naprosyn for the hip pain. Hence most likely Reglan may have caused her acute encephalopathy versus atypical migraines. MRI and MRA of the brain was done which showed no stroke or tumor or mass. MRA brain multifocal arterial irregularity suggestive of intracranial atherosclerosis. EEG was normal.  MRA neck was unremarkable. Neurology was consulted who felt to possibly migraine headache with transient confusion may have caused the episode. Ammonia level, TSH level were within normal limits. RMSF and Lyme disease serology was sent, they are still pending. B12 folate were normal, lactic acid was normal   Patient is on Depakote 250 mg daily PRN for migraines. I strongly recommended the patient to followup with the outpatient neurology due to her frequent migraine headaches    Type II or unspecified type diabetes mellitus : Blood sugars were well controlled.    Day of Discharge BP 118/70  Pulse 69  Temp(Src) 97.9 F (36.6 C) (Oral)  Resp 18  Ht 5\' 4"  (1.626 m)  Wt 62.914 kg (138 lb 11.2 oz)  BMI 23.80 kg/m2  SpO2 98%  Physical Exam: General: Alert and awake oriented x3 not in any acute distress. HEENT: anicteric sclera, pupils reactive to light and accommodation CVS: S1-S2 clear no murmur rubs or gallops Chest: clear to auscultation bilaterally, no wheezing rales or rhonchi Abdomen: soft nontender, nondistended, normal bowel sounds, no organomegaly Extremities: no cyanosis, clubbing or edema noted bilaterally Neuro: Cranial nerves II-XII intact, no focal neurological deficits   The results of significant diagnostics from this hospitalization (including imaging, microbiology, ancillary and laboratory) are listed below for reference.    LAB RESULTS: Basic Metabolic Panel:  Recent Labs Lab 06/08/13 0740 06/09/13 0445  NA 134* 140  K 3.7 3.6  CL 96 101   CO2 23 24  GLUCOSE 188* 126*  BUN 11 10  CREATININE 0.72 0.74  CALCIUM 9.4 9.4   Liver Function Tests:  Recent Labs Lab 06/08/13 0740  AST 17  ALT 12  ALKPHOS 61  BILITOT 0.2*  PROT 7.5  ALBUMIN 3.9   No results found for this basename: LIPASE, AMYLASE,  in the last 168 hours  Recent Labs Lab 06/08/13 0740  AMMONIA 31   CBC:  Recent Labs Lab 06/08/13 0740 06/09/13 0445  WBC 10.1 7.3  NEUTROABS 7.3  --   HGB 12.9 13.6  HCT 36.6 39.9  MCV 86.9 89.5  PLT 242 222   Cardiac Enzymes:  Recent Labs Lab 06/08/13 0740  CKTOTAL 116  TROPONINI <0.30   BNP: No components found with this basename: POCBNP,  CBG:  Recent Labs Lab 06/09/13 0427 06/09/13 0753  GLUCAP 119* 126*  Significant Diagnostic Studies:  Mr Shirlee Latch Wo Contrast  06/09/2013   CLINICAL DATA:  Nausea, vomiting, confusion, with severe headache. History of left-sided carotid -subclavian bypass  EXAM: MRI HEAD WITH CONTRAST AND MRA HEAD WITH CONTRAST AND MRI NECK WITHOUT AND WITH CONTRAST  TECHNIQUE: Multiplanar, multiecho pulse sequences of the brain and surrounding structures were obtained with intravenous contrast. Angiographic images of the head were obtained using MRA technique with contrast. Multiplanar, multiecho pulse sequences of the neck and surrounding structures were obtained without and with intravenous contrast.  CONTRAST:  13mL MULTIHANCE GADOBENATE DIMEGLUMINE 529 MG/ML IV SOLN  COMPARISON:  None.  FINDINGS: MRI HEAD FINDINGS  Scattered foci of hyperintense T2/FLAIR signal within the periventricular and deep white matter are present, most compatible with chronic microvascular ischemic disease. No other focal parenchymal signal abnormality identified. No abnormal signal intensity within the hippocampal regions are temporal lobes to suggest seizure. There is moderate generalized atrophy for patient age. Ventricles are normal in size without evidence of hydrocephalus. No mass lesion or  midline shift. No extra-axial fluid collection. Gray-white matter differentiation is maintained.  No diffusion-weighted signal abnormality to suggest acute intracranial infarct is identified. No intracranial hemorrhage seen on the gradient echo sequence. Remote left cerebellar infarct is noted (series 6, image 4).  Craniocervical junction is normal. Corpus callosum is within normal limits. Pituitary gland is not well evaluated, but grossly unremarkable. Orbits, optic nerves, and optic chiasm are within normal limits.  Calvarium demonstrates a normal appearance with normal signal intensity. Scalp soft tissues are within normal limits.  Paranasal sinuses and mastoid air cells are clear.  No abnormal enhancement identified on post-contrast sequences.  MRA HEAD FINDINGS  Study is somewhat degraded by motion artifact.  Multi focal irregularity is seen within the petrous, cavernous, and supra clinoid segments of the internal carotid arteries bilaterally may be related to underlying atherosclerotic disease and/ or motion artifact. No definite high-grade stenosis identified. The A1 segments, anterior communicating artery, and anterior cerebral arteries are patent with antegrade flow. Multi focal irregularity is seen within the middle cerebral arteries which are patent with antegrade flow. The MCA artery branches are opacified distally. No definite high-grade flow-limiting stenosis identified within the anterior circulation.  The vertebral arteries are codominant with widely patent antegrade flow. Vertebrobasilar junction is normal. Basilar artery is widely patent. Posterior cerebral arteries are widely patent without high-grade stenosis. Superior cerebellar arteries, and posterior inferior cerebellar arteries are patent with antegrade flow. Anterior inferior cerebral arteries are not well evaluated. Posterior communicating arteries are not well seen as well. No high-grade flow-limiting stenosis identified within the  posterior circulation.  No acute intracranial aneurysm identified.  MRI NECK FINDINGS  Visualized aortic arch is unremarkable. No high-grade stenosis seen at the origin of the great vessels. Right brachiocephalic artery and subclavian arteries are within normal limits. The left subclavian artery is grossly patent.  Common carotid arteries are widely patent bilaterally with antegrade flow. No high-grade stenosis identified within the common carotid arteries. The carotid bifurcations are within normal limits.  A short segment stenosis of approximately 60% is seen within the proximal right internal carotid artery (series 10960, image 6), just distal to the bifurcation. The right internal carotid artery is somewhat irregular distally without high-grade stenosis.  The left internal carotid artery is widely patent with antegrade flow without high-grade stenosis.  External carotid arteries and their branches are grossly normal.  Irregularity with short-segment stenosis of approximately in 30% is seen within the proximal right vertebral artery (  series 44034, image 7). Vertebral arteries are otherwise well opacified without high-grade stenosis or other abnormality.  IMPRESSION: MRI HEAD:  1. No acute intracranial infarct or other abnormality identified. 2. Remote left cerebellar infarct. 3. Moderate age-related atrophy with mild chronic microvascular ischemic changes.  MRA HEAD:  1. Multi focal irregularity within the internal carotid, anterior cerebral, and middle cerebral arteries bilaterally, which may be related to underlying atherosclerotic disease. Please note that there is extensive motion artifact on this portion of the exam, which may contribute to these findings. 2. No high-grade stenosis or intracranial aneurysm identified within the intracranial circulation.  MRA NECK:  1. Short segment stenosis of approximately 60% within the proximal right internal carotid artery with mild multi focal irregularity distally. 2.  Widely patent left internal carotid artery without evidence of high-grade flow-limiting stenosis. 3. Irregularity with short-segment stenosis of approximately 30% within the proximal right vertebral artery. Otherwise widely patent vertebral arteries bilaterally. 4. Nonvisualization of known left carotid subclavian stent.   Electronically Signed   By: Rise Mu M.D.   On: 06/09/2013 02:15   Mr Angiogram Neck W Wo Contrast  06/09/2013   CLINICAL DATA:  Nausea, vomiting, confusion, with severe headache. History of left-sided carotid -subclavian bypass  EXAM: MRI HEAD WITH CONTRAST AND MRA HEAD WITH CONTRAST AND MRI NECK WITHOUT AND WITH CONTRAST  TECHNIQUE: Multiplanar, multiecho pulse sequences of the brain and surrounding structures were obtained with intravenous contrast. Angiographic images of the head were obtained using MRA technique with contrast. Multiplanar, multiecho pulse sequences of the neck and surrounding structures were obtained without and with intravenous contrast.  CONTRAST:  13mL MULTIHANCE GADOBENATE DIMEGLUMINE 529 MG/ML IV SOLN  COMPARISON:  None.  FINDINGS: MRI HEAD FINDINGS  Scattered foci of hyperintense T2/FLAIR signal within the periventricular and deep white matter are present, most compatible with chronic microvascular ischemic disease. No other focal parenchymal signal abnormality identified. No abnormal signal intensity within the hippocampal regions are temporal lobes to suggest seizure. There is moderate generalized atrophy for patient age. Ventricles are normal in size without evidence of hydrocephalus. No mass lesion or midline shift. No extra-axial fluid collection. Gray-white matter differentiation is maintained.  No diffusion-weighted signal abnormality to suggest acute intracranial infarct is identified. No intracranial hemorrhage seen on the gradient echo sequence. Remote left cerebellar infarct is noted (series 6, image 4).  Craniocervical junction is normal.  Corpus callosum is within normal limits. Pituitary gland is not well evaluated, but grossly unremarkable. Orbits, optic nerves, and optic chiasm are within normal limits.  Calvarium demonstrates a normal appearance with normal signal intensity. Scalp soft tissues are within normal limits.  Paranasal sinuses and mastoid air cells are clear.  No abnormal enhancement identified on post-contrast sequences.  MRA HEAD FINDINGS  Study is somewhat degraded by motion artifact.  Multi focal irregularity is seen within the petrous, cavernous, and supra clinoid segments of the internal carotid arteries bilaterally may be related to underlying atherosclerotic disease and/ or motion artifact. No definite high-grade stenosis identified. The A1 segments, anterior communicating artery, and anterior cerebral arteries are patent with antegrade flow. Multi focal irregularity is seen within the middle cerebral arteries which are patent with antegrade flow. The MCA artery branches are opacified distally. No definite high-grade flow-limiting stenosis identified within the anterior circulation.  The vertebral arteries are codominant with widely patent antegrade flow. Vertebrobasilar junction is normal. Basilar artery is widely patent. Posterior cerebral arteries are widely patent without high-grade stenosis. Superior cerebellar arteries, and posterior inferior  cerebellar arteries are patent with antegrade flow. Anterior inferior cerebral arteries are not well evaluated. Posterior communicating arteries are not well seen as well. No high-grade flow-limiting stenosis identified within the posterior circulation.  No acute intracranial aneurysm identified.  MRI NECK FINDINGS  Visualized aortic arch is unremarkable. No high-grade stenosis seen at the origin of the great vessels. Right brachiocephalic artery and subclavian arteries are within normal limits. The left subclavian artery is grossly patent.  Common carotid arteries are widely patent  bilaterally with antegrade flow. No high-grade stenosis identified within the common carotid arteries. The carotid bifurcations are within normal limits.  A short segment stenosis of approximately 60% is seen within the proximal right internal carotid artery (series 33295, image 6), just distal to the bifurcation. The right internal carotid artery is somewhat irregular distally without high-grade stenosis.  The left internal carotid artery is widely patent with antegrade flow without high-grade stenosis.  External carotid arteries and their branches are grossly normal.  Irregularity with short-segment stenosis of approximately in 30% is seen within the proximal right vertebral artery (series 18841, image 7). Vertebral arteries are otherwise well opacified without high-grade stenosis or other abnormality.  IMPRESSION: MRI HEAD:  1. No acute intracranial infarct or other abnormality identified. 2. Remote left cerebellar infarct. 3. Moderate age-related atrophy with mild chronic microvascular ischemic changes.  MRA HEAD:  1. Multi focal irregularity within the internal carotid, anterior cerebral, and middle cerebral arteries bilaterally, which may be related to underlying atherosclerotic disease. Please note that there is extensive motion artifact on this portion of the exam, which may contribute to these findings. 2. No high-grade stenosis or intracranial aneurysm identified within the intracranial circulation.  MRA NECK:  1. Short segment stenosis of approximately 60% within the proximal right internal carotid artery with mild multi focal irregularity distally. 2. Widely patent left internal carotid artery without evidence of high-grade flow-limiting stenosis. 3. Irregularity with short-segment stenosis of approximately 30% within the proximal right vertebral artery. Otherwise widely patent vertebral arteries bilaterally. 4. Nonvisualization of known left carotid subclavian stent.   Electronically Signed   By:  Rise Mu M.D.   On: 06/09/2013 02:15   Mr Laqueta Jean YS Contrast  06/09/2013   CLINICAL DATA:  Nausea, vomiting, confusion, with severe headache. History of left-sided carotid -subclavian bypass  EXAM: MRI HEAD WITH CONTRAST AND MRA HEAD WITH CONTRAST AND MRI NECK WITHOUT AND WITH CONTRAST  TECHNIQUE: Multiplanar, multiecho pulse sequences of the brain and surrounding structures were obtained with intravenous contrast. Angiographic images of the head were obtained using MRA technique with contrast. Multiplanar, multiecho pulse sequences of the neck and surrounding structures were obtained without and with intravenous contrast.  CONTRAST:  13mL MULTIHANCE GADOBENATE DIMEGLUMINE 529 MG/ML IV SOLN  COMPARISON:  None.  FINDINGS: MRI HEAD FINDINGS  Scattered foci of hyperintense T2/FLAIR signal within the periventricular and deep white matter are present, most compatible with chronic microvascular ischemic disease. No other focal parenchymal signal abnormality identified. No abnormal signal intensity within the hippocampal regions are temporal lobes to suggest seizure. There is moderate generalized atrophy for patient age. Ventricles are normal in size without evidence of hydrocephalus. No mass lesion or midline shift. No extra-axial fluid collection. Gray-white matter differentiation is maintained.  No diffusion-weighted signal abnormality to suggest acute intracranial infarct is identified. No intracranial hemorrhage seen on the gradient echo sequence. Remote left cerebellar infarct is noted (series 6, image 4).  Craniocervical junction is normal. Corpus callosum is within normal limits. Pituitary  gland is not well evaluated, but grossly unremarkable. Orbits, optic nerves, and optic chiasm are within normal limits.  Calvarium demonstrates a normal appearance with normal signal intensity. Scalp soft tissues are within normal limits.  Paranasal sinuses and mastoid air cells are clear.  No abnormal enhancement  identified on post-contrast sequences.  MRA HEAD FINDINGS  Study is somewhat degraded by motion artifact.  Multi focal irregularity is seen within the petrous, cavernous, and supra clinoid segments of the internal carotid arteries bilaterally may be related to underlying atherosclerotic disease and/ or motion artifact. No definite high-grade stenosis identified. The A1 segments, anterior communicating artery, and anterior cerebral arteries are patent with antegrade flow. Multi focal irregularity is seen within the middle cerebral arteries which are patent with antegrade flow. The MCA artery branches are opacified distally. No definite high-grade flow-limiting stenosis identified within the anterior circulation.  The vertebral arteries are codominant with widely patent antegrade flow. Vertebrobasilar junction is normal. Basilar artery is widely patent. Posterior cerebral arteries are widely patent without high-grade stenosis. Superior cerebellar arteries, and posterior inferior cerebellar arteries are patent with antegrade flow. Anterior inferior cerebral arteries are not well evaluated. Posterior communicating arteries are not well seen as well. No high-grade flow-limiting stenosis identified within the posterior circulation.  No acute intracranial aneurysm identified.  MRI NECK FINDINGS  Visualized aortic arch is unremarkable. No high-grade stenosis seen at the origin of the great vessels. Right brachiocephalic artery and subclavian arteries are within normal limits. The left subclavian artery is grossly patent.  Common carotid arteries are widely patent bilaterally with antegrade flow. No high-grade stenosis identified within the common carotid arteries. The carotid bifurcations are within normal limits.  A short segment stenosis of approximately 60% is seen within the proximal right internal carotid artery (series 56213, image 6), just distal to the bifurcation. The right internal carotid artery is somewhat  irregular distally without high-grade stenosis.  The left internal carotid artery is widely patent with antegrade flow without high-grade stenosis.  External carotid arteries and their branches are grossly normal.  Irregularity with short-segment stenosis of approximately in 30% is seen within the proximal right vertebral artery (series 08657, image 7). Vertebral arteries are otherwise well opacified without high-grade stenosis or other abnormality.  IMPRESSION: MRI HEAD:  1. No acute intracranial infarct or other abnormality identified. 2. Remote left cerebellar infarct. 3. Moderate age-related atrophy with mild chronic microvascular ischemic changes.  MRA HEAD:  1. Multi focal irregularity within the internal carotid, anterior cerebral, and middle cerebral arteries bilaterally, which may be related to underlying atherosclerotic disease. Please note that there is extensive motion artifact on this portion of the exam, which may contribute to these findings. 2. No high-grade stenosis or intracranial aneurysm identified within the intracranial circulation.  MRA NECK:  1. Short segment stenosis of approximately 60% within the proximal right internal carotid artery with mild multi focal irregularity distally. 2. Widely patent left internal carotid artery without evidence of high-grade flow-limiting stenosis. 3. Irregularity with short-segment stenosis of approximately 30% within the proximal right vertebral artery. Otherwise widely patent vertebral arteries bilaterally. 4. Nonvisualization of known left carotid subclavian stent.   Electronically Signed   By: Rise Mu M.D.   On: 06/09/2013 02:15   Dg Chest Port 1 View  06/08/2013   CLINICAL DATA:  Previous history of pneumonia and bronchitis, status post previous CABG  EXAM: PORTABLE CHEST - 1 VIEW  COMPARISON:  There are no chest x-rays more recent than 2003 available for review.  FINDINGS: The lungs are adequately inflated and clear. The  cardiopericardial silhouette is top-normal in size. The pulmonary vascularity is not engorged. The mediastinum is normal in width. The patient has undergone previous CABG. There is mild tortuosity of the descending thoracic aorta. There is no pleural effusion or pneumothorax. The observed portions of the bony thorax exhibit no acute abnormalities. There are surgical clips at the base of the neck on the left.  IMPRESSION: There is no evidence of pneumonia nor CHF or other acute cardiopulmonary abnormality.   Electronically Signed   By: David  [place]   On: 06/08/2013 14:44       Disposition and Follow-up:     Discharge Orders   Future Appointments Provider Department Dept Phone   08/22/2013 9:00 AM [facility] [place] [facility] [place] ST [phone]   08/22/2013 10:00 AM Carma Lair Nickel, NP [facility] [phone]   Future Orders Complete By Expires   Diet - low sodium heart healthy  As directed    Discharge instructions  As directed    Comments:     Please follow-up with out-patient neurology.   Increase activity slowly  As directed        DISPOSITION: Home    TESTS THAT NEED FOLLOW-UP Lymes ds, serology, RMSF serology   DISCHARGE FOLLOW-UP Follow-up Information   Follow up with JAFFE, ADAM ROBERT, DO. Schedule an appointment as soon as possible for a visit in 2 weeks. (please call on Monday to make appt)    Specialty:  Neurology   Contact information:   [address] [place] [address] [phone]       Follow up with Kaleen Mask, MD. Schedule an appointment as soon as possible for a visit in 10 days. (for hospital follow-up)    Specialty:  Family Medicine   Contact information:   [address] [place] [address] [phone]       Time spent on Discharge: 40 mins  Signed:   Mady Oubre M.D. [facility] 06/09/2013, 2:45 PM Pager: [phone]

## 2013-06-09 NOTE — Progress Notes (Signed)
NEURO HOSPITALIST PROGRESS NOTE   SUBJECTIVE:                                                                                                                        Doing much better today, in good spirits. Husband at bedside reports that she is back to baseline, no further confusion. MRI brain unremarkable. MRA brain multifocal arterial irregularity suggestive of intracranial atherosclerosis. MRA neck unremarkable. EEG normal.  OBJECTIVE:                                                                                                                           Vital signs in last 24 hours: Temp:  [97.9 F (36.6 C)-98.8 F (37.1 C)] 97.9 F (36.6 C) (12/06 0400) Pulse Rate:  [65-75] 69 (12/06 0400) Resp:  [18] 18 (12/06 0400) BP: (106-118)/(57-70) 118/70 mmHg (12/06 0400) SpO2:  [94 %-98 %] 98 % (12/06 0400) Weight:  [62.914 kg (138 lb 11.2 oz)] 62.914 kg (138 lb 11.2 oz) (12/06 0400)  Intake/Output from previous day:   Intake/Output this shift:   Nutritional status: Carb Control  Past Medical History  Diagnosis Date  . Hyperlipidemia   . IHD (ischemic heart disease)   . PAOD (peripheral arterial occlusive disease)   . Carotid bruit   . Diabetes mellitus   . Hypertension   . PVD (peripheral vascular disease)   . Coronary artery disease   . Dizziness   . Persistent headaches   . Pneumonia Jan. 2014  . Bronchitis Jan 2014    became pneumonia    Neurologic Exam:  Mental Status: Alert, oriented, thought content appropriate.  Speech fluent without evidence of aphasia.  Able to follow 3 step commands without difficulty. Cranial Nerves: II: Discs flat bilaterally; Visual fields grossly normal, pupils equal, round, reactive to light and accommodation III,IV, VI: ptosis not present, extra-ocular motions intact bilaterally V,VII: smile symmetric, facial light touch sensation normal bilaterally VIII: hearing normal bilaterally IX,X: gag  reflex present XI: bilateral shoulder shrug XII: midline tongue extension without atrophy or fasciculations  Motor: Right : Upper extremity   5/5    Left:     Upper extremity   5/5  Lower extremity   5/5  Lower extremity   5/5 Tone and bulk:normal tone throughout; no atrophy noted Sensory: Pinprick and light touch intact throughout, bilaterally Deep Tendon Reflexes:  Right: Upper Extremity   Left: Upper extremity   biceps (C-5 to C-6) 2/4   biceps (C-5 to C-6) 2/4 tricep (C7) 2/4    triceps (C7) 2/4 Brachioradialis (C6) 2/4  Brachioradialis (C6) 2/4  Lower Extremity Lower Extremity  quadriceps (L-2 to L-4) 2/4   quadriceps (L-2 to L-4) 2/4 Achilles (S1) 2/4   Achilles (S1) 2/4  Plantars: Right: downgoing   Left: downgoing Cerebellar: normal finger-to-nose,  normal heel-to-shin test Gait: No tested. CV: pulses palpable throughout    Lab Results: Lab Results  Component Value Date/Time   CHOL 164 05/18/2012  9:26 AM   Lipid Panel No results found for this basename: CHOL, TRIG, HDL, CHOLHDL, VLDL, LDLCALC,  in the last 72 hours  Studies/Results: Mr Shirlee Latch Wo Contrast  06/09/2013   CLINICAL DATA:  Nausea, vomiting, confusion, with severe headache. History of left-sided carotid -subclavian bypass  EXAM: MRI HEAD WITH CONTRAST AND MRA HEAD WITH CONTRAST AND MRI NECK WITHOUT AND WITH CONTRAST  TECHNIQUE: Multiplanar, multiecho pulse sequences of the brain and surrounding structures were obtained with intravenous contrast. Angiographic images of the head were obtained using MRA technique with contrast. Multiplanar, multiecho pulse sequences of the neck and surrounding structures were obtained without and with intravenous contrast.  CONTRAST:  13mL MULTIHANCE GADOBENATE DIMEGLUMINE 529 MG/ML IV SOLN  COMPARISON:  None.  FINDINGS: MRI HEAD FINDINGS  Scattered foci of hyperintense T2/FLAIR signal within the periventricular and deep white matter are present, most compatible with  chronic microvascular ischemic disease. No other focal parenchymal signal abnormality identified. No abnormal signal intensity within the hippocampal regions are temporal lobes to suggest seizure. There is moderate generalized atrophy for patient age. Ventricles are normal in size without evidence of hydrocephalus. No mass lesion or midline shift. No extra-axial fluid collection. Gray-white matter differentiation is maintained.  No diffusion-weighted signal abnormality to suggest acute intracranial infarct is identified. No intracranial hemorrhage seen on the gradient echo sequence. Remote left cerebellar infarct is noted (series 6, image 4).  Craniocervical junction is normal. Corpus callosum is within normal limits. Pituitary gland is not well evaluated, but grossly unremarkable. Orbits, optic nerves, and optic chiasm are within normal limits.  Calvarium demonstrates a normal appearance with normal signal intensity. Scalp soft tissues are within normal limits.  Paranasal sinuses and mastoid air cells are clear.  No abnormal enhancement identified on post-contrast sequences.  MRA HEAD FINDINGS  Study is somewhat degraded by motion artifact.  Multi focal irregularity is seen within the petrous, cavernous, and supra clinoid segments of the internal carotid arteries bilaterally may be related to underlying atherosclerotic disease and/ or motion artifact. No definite high-grade stenosis identified. The A1 segments, anterior communicating artery, and anterior cerebral arteries are patent with antegrade flow. Multi focal irregularity is seen within the middle cerebral arteries which are patent with antegrade flow. The MCA artery branches are opacified distally. No definite high-grade flow-limiting stenosis identified within the anterior circulation.  The vertebral arteries are codominant with widely patent antegrade flow. Vertebrobasilar junction is normal. Basilar artery is widely patent. Posterior cerebral arteries are  widely patent without high-grade stenosis. Superior cerebellar arteries, and posterior inferior cerebellar arteries are patent with antegrade flow. Anterior inferior cerebral arteries are not well evaluated. Posterior communicating arteries are not well seen as well. No high-grade flow-limiting stenosis identified within the posterior circulation.  No acute intracranial aneurysm identified.  MRI NECK FINDINGS  Visualized aortic arch is unremarkable. No high-grade stenosis seen at the origin of the great vessels. Right brachiocephalic artery and subclavian arteries are within normal limits. The left subclavian artery is grossly patent.  Common carotid arteries are widely patent bilaterally with antegrade flow. No high-grade stenosis identified within the common carotid arteries. The carotid bifurcations are within normal limits.  A short segment stenosis of approximately 60% is seen within the proximal right internal carotid artery (series 40981, image 6), just distal to the bifurcation. The right internal carotid artery is somewhat irregular distally without high-grade stenosis.  The left internal carotid artery is widely patent with antegrade flow without high-grade stenosis.  External carotid arteries and their branches are grossly normal.  Irregularity with short-segment stenosis of approximately in 30% is seen within the proximal right vertebral artery (series 19147, image 7). Vertebral arteries are otherwise well opacified without high-grade stenosis or other abnormality.  IMPRESSION: MRI HEAD:  1. No acute intracranial infarct or other abnormality identified. 2. Remote left cerebellar infarct. 3. Moderate age-related atrophy with mild chronic microvascular ischemic changes.  MRA HEAD:  1. Multi focal irregularity within the internal carotid, anterior cerebral, and middle cerebral arteries bilaterally, which may be related to underlying atherosclerotic disease. Please note that there is extensive motion  artifact on this portion of the exam, which may contribute to these findings. 2. No high-grade stenosis or intracranial aneurysm identified within the intracranial circulation.  MRA NECK:  1. Short segment stenosis of approximately 60% within the proximal right internal carotid artery with mild multi focal irregularity distally. 2. Widely patent left internal carotid artery without evidence of high-grade flow-limiting stenosis. 3. Irregularity with short-segment stenosis of approximately 30% within the proximal right vertebral artery. Otherwise widely patent vertebral arteries bilaterally. 4. Nonvisualization of known left carotid subclavian stent.   Electronically Signed   By: Rise Mu M.D.   On: 06/09/2013 02:15   Mr Angiogram Neck W Wo Contrast  06/09/2013   CLINICAL DATA:  Nausea, vomiting, confusion, with severe headache. History of left-sided carotid -subclavian bypass  EXAM: MRI HEAD WITH CONTRAST AND MRA HEAD WITH CONTRAST AND MRI NECK WITHOUT AND WITH CONTRAST  TECHNIQUE: Multiplanar, multiecho pulse sequences of the brain and surrounding structures were obtained with intravenous contrast. Angiographic images of the head were obtained using MRA technique with contrast. Multiplanar, multiecho pulse sequences of the neck and surrounding structures were obtained without and with intravenous contrast.  CONTRAST:  13mL MULTIHANCE GADOBENATE DIMEGLUMINE 529 MG/ML IV SOLN  COMPARISON:  None.  FINDINGS: MRI HEAD FINDINGS  Scattered foci of hyperintense T2/FLAIR signal within the periventricular and deep white matter are present, most compatible with chronic microvascular ischemic disease. No other focal parenchymal signal abnormality identified. No abnormal signal intensity within the hippocampal regions are temporal lobes to suggest seizure. There is moderate generalized atrophy for patient age. Ventricles are normal in size without evidence of hydrocephalus. No mass lesion or midline shift. No  extra-axial fluid collection. Gray-white matter differentiation is maintained.  No diffusion-weighted signal abnormality to suggest acute intracranial infarct is identified. No intracranial hemorrhage seen on the gradient echo sequence. Remote left cerebellar infarct is noted (series 6, image 4).  Craniocervical junction is normal. Corpus callosum is within normal limits. Pituitary gland is not well evaluated, but grossly unremarkable. Orbits, optic nerves, and optic chiasm are within normal limits.  Calvarium demonstrates a normal appearance with normal signal intensity. Scalp soft tissues are within  normal limits.  Paranasal sinuses and mastoid air cells are clear.  No abnormal enhancement identified on post-contrast sequences.  MRA HEAD FINDINGS  Study is somewhat degraded by motion artifact.  Multi focal irregularity is seen within the petrous, cavernous, and supra clinoid segments of the internal carotid arteries bilaterally may be related to underlying atherosclerotic disease and/ or motion artifact. No definite high-grade stenosis identified. The A1 segments, anterior communicating artery, and anterior cerebral arteries are patent with antegrade flow. Multi focal irregularity is seen within the middle cerebral arteries which are patent with antegrade flow. The MCA artery branches are opacified distally. No definite high-grade flow-limiting stenosis identified within the anterior circulation.  The vertebral arteries are codominant with widely patent antegrade flow. Vertebrobasilar junction is normal. Basilar artery is widely patent. Posterior cerebral arteries are widely patent without high-grade stenosis. Superior cerebellar arteries, and posterior inferior cerebellar arteries are patent with antegrade flow. Anterior inferior cerebral arteries are not well evaluated. Posterior communicating arteries are not well seen as well. No high-grade flow-limiting stenosis identified within the posterior circulation.   No acute intracranial aneurysm identified.  MRI NECK FINDINGS  Visualized aortic arch is unremarkable. No high-grade stenosis seen at the origin of the great vessels. Right brachiocephalic artery and subclavian arteries are within normal limits. The left subclavian artery is grossly patent.  Common carotid arteries are widely patent bilaterally with antegrade flow. No high-grade stenosis identified within the common carotid arteries. The carotid bifurcations are within normal limits.  A short segment stenosis of approximately 60% is seen within the proximal right internal carotid artery (series 09811, image 6), just distal to the bifurcation. The right internal carotid artery is somewhat irregular distally without high-grade stenosis.  The left internal carotid artery is widely patent with antegrade flow without high-grade stenosis.  External carotid arteries and their branches are grossly normal.  Irregularity with short-segment stenosis of approximately in 30% is seen within the proximal right vertebral artery (series 91478, image 7). Vertebral arteries are otherwise well opacified without high-grade stenosis or other abnormality.  IMPRESSION: MRI HEAD:  1. No acute intracranial infarct or other abnormality identified. 2. Remote left cerebellar infarct. 3. Moderate age-related atrophy with mild chronic microvascular ischemic changes.  MRA HEAD:  1. Multi focal irregularity within the internal carotid, anterior cerebral, and middle cerebral arteries bilaterally, which may be related to underlying atherosclerotic disease. Please note that there is extensive motion artifact on this portion of the exam, which may contribute to these findings. 2. No high-grade stenosis or intracranial aneurysm identified within the intracranial circulation.  MRA NECK:  1. Short segment stenosis of approximately 60% within the proximal right internal carotid artery with mild multi focal irregularity distally. 2. Widely patent left  internal carotid artery without evidence of high-grade flow-limiting stenosis. 3. Irregularity with short-segment stenosis of approximately 30% within the proximal right vertebral artery. Otherwise widely patent vertebral arteries bilaterally. 4. Nonvisualization of known left carotid subclavian stent.   Electronically Signed   By: Rise Mu M.D.   On: 06/09/2013 02:15   Mr Laqueta Jean GN Contrast  06/09/2013   CLINICAL DATA:  Nausea, vomiting, confusion, with severe headache. History of left-sided carotid -subclavian bypass  EXAM: MRI HEAD WITH CONTRAST AND MRA HEAD WITH CONTRAST AND MRI NECK WITHOUT AND WITH CONTRAST  TECHNIQUE: Multiplanar, multiecho pulse sequences of the brain and surrounding structures were obtained with intravenous contrast. Angiographic images of the head were obtained using MRA technique with contrast. Multiplanar, multiecho pulse sequences of the  neck and surrounding structures were obtained without and with intravenous contrast.  CONTRAST:  13mL MULTIHANCE GADOBENATE DIMEGLUMINE 529 MG/ML IV SOLN  COMPARISON:  None.  FINDINGS: MRI HEAD FINDINGS  Scattered foci of hyperintense T2/FLAIR signal within the periventricular and deep white matter are present, most compatible with chronic microvascular ischemic disease. No other focal parenchymal signal abnormality identified. No abnormal signal intensity within the hippocampal regions are temporal lobes to suggest seizure. There is moderate generalized atrophy for patient age. Ventricles are normal in size without evidence of hydrocephalus. No mass lesion or midline shift. No extra-axial fluid collection. Gray-white matter differentiation is maintained.  No diffusion-weighted signal abnormality to suggest acute intracranial infarct is identified. No intracranial hemorrhage seen on the gradient echo sequence. Remote left cerebellar infarct is noted (series 6, image 4).  Craniocervical junction is normal. Corpus callosum is within normal  limits. Pituitary gland is not well evaluated, but grossly unremarkable. Orbits, optic nerves, and optic chiasm are within normal limits.  Calvarium demonstrates a normal appearance with normal signal intensity. Scalp soft tissues are within normal limits.  Paranasal sinuses and mastoid air cells are clear.  No abnormal enhancement identified on post-contrast sequences.  MRA HEAD FINDINGS  Study is somewhat degraded by motion artifact.  Multi focal irregularity is seen within the petrous, cavernous, and supra clinoid segments of the internal carotid arteries bilaterally may be related to underlying atherosclerotic disease and/ or motion artifact. No definite high-grade stenosis identified. The A1 segments, anterior communicating artery, and anterior cerebral arteries are patent with antegrade flow. Multi focal irregularity is seen within the middle cerebral arteries which are patent with antegrade flow. The MCA artery branches are opacified distally. No definite high-grade flow-limiting stenosis identified within the anterior circulation.  The vertebral arteries are codominant with widely patent antegrade flow. Vertebrobasilar junction is normal. Basilar artery is widely patent. Posterior cerebral arteries are widely patent without high-grade stenosis. Superior cerebellar arteries, and posterior inferior cerebellar arteries are patent with antegrade flow. Anterior inferior cerebral arteries are not well evaluated. Posterior communicating arteries are not well seen as well. No high-grade flow-limiting stenosis identified within the posterior circulation.  No acute intracranial aneurysm identified.  MRI NECK FINDINGS  Visualized aortic arch is unremarkable. No high-grade stenosis seen at the origin of the great vessels. Right brachiocephalic artery and subclavian arteries are within normal limits. The left subclavian artery is grossly patent.  Common carotid arteries are widely patent bilaterally with antegrade flow.  No high-grade stenosis identified within the common carotid arteries. The carotid bifurcations are within normal limits.  A short segment stenosis of approximately 60% is seen within the proximal right internal carotid artery (series 41324, image 6), just distal to the bifurcation. The right internal carotid artery is somewhat irregular distally without high-grade stenosis.  The left internal carotid artery is widely patent with antegrade flow without high-grade stenosis.  External carotid arteries and their branches are grossly normal.  Irregularity with short-segment stenosis of approximately in 30% is seen within the proximal right vertebral artery (series 40102, image 7). Vertebral arteries are otherwise well opacified without high-grade stenosis or other abnormality.  IMPRESSION: MRI HEAD:  1. No acute intracranial infarct or other abnormality identified. 2. Remote left cerebellar infarct. 3. Moderate age-related atrophy with mild chronic microvascular ischemic changes.  MRA HEAD:  1. Multi focal irregularity within the internal carotid, anterior cerebral, and middle cerebral arteries bilaterally, which may be related to underlying atherosclerotic disease. Please note that there is extensive motion artifact on  this portion of the exam, which may contribute to these findings. 2. No high-grade stenosis or intracranial aneurysm identified within the intracranial circulation.  MRA NECK:  1. Short segment stenosis of approximately 60% within the proximal right internal carotid artery with mild multi focal irregularity distally. 2. Widely patent left internal carotid artery without evidence of high-grade flow-limiting stenosis. 3. Irregularity with short-segment stenosis of approximately 30% within the proximal right vertebral artery. Otherwise widely patent vertebral arteries bilaterally. 4. Nonvisualization of known left carotid subclavian stent.   Electronically Signed   By: Rise Mu M.D.   On:  06/09/2013 02:15   Dg Chest Port 1 View  06/08/2013   CLINICAL DATA:  Previous history of pneumonia and bronchitis, status post previous CABG  EXAM: PORTABLE CHEST - 1 VIEW  COMPARISON:  There are no chest x-rays more recent than 2003 available for review.  FINDINGS: The lungs are adequately inflated and clear. The cardiopericardial silhouette is top-normal in size. The pulmonary vascularity is not engorged. The mediastinum is normal in width. The patient has undergone previous CABG. There is mild tortuosity of the descending thoracic aorta. There is no pleural effusion or pneumothorax. The observed portions of the bony thorax exhibit no acute abnormalities. There are surgical clips at the base of the neck on the left.  IMPRESSION: There is no evidence of pneumonia nor CHF or other acute cardiopulmonary abnormality.   Electronically Signed   By: David  [place]   On: 06/08/2013 14:44    MEDICATIONS                                                                                                                       I have reviewed the patient's current medications.  ASSESSMENT/PLAN:                                                                                                           Mrs. Walth is back to baseline and her neurological work up is unremarkable. Suspect migraine HA with transient confusion, and perhaps medication induced confusion. Consider starting migraine prevention with Topamax 50 mg daily, with further dose adjustment as outpatient. Will sign off.  Wyatt Portela, MD [facility] [phone]  06/09/2013, 9:25 AM

## 2013-06-09 NOTE — Progress Notes (Signed)
Discharge review done with patient.   Patient acknowledged understanding of information provided. Patient is stable and discharged home with husband. Danielle Farrell

## 2013-06-11 LAB — B. BURGDORFI ANTIBODIES: B burgdorferi Ab IgG+IgM: 0.27 {ISR}

## 2013-06-12 LAB — ROCKY MTN SPOTTED FVR AB, IGG-BLOOD: RMSF IgG: 0.48 IV

## 2013-06-14 LAB — CULTURE, BLOOD (ROUTINE X 2): Culture: NO GROWTH

## 2013-06-15 ENCOUNTER — Ambulatory Visit (INDEPENDENT_AMBULATORY_CARE_PROVIDER_SITE_OTHER): Payer: Medicare Other | Admitting: Neurology

## 2013-06-15 ENCOUNTER — Encounter: Payer: Self-pay | Admitting: Neurology

## 2013-06-15 VITALS — BP 112/70 | HR 63 | Ht 64.0 in | Wt 142.0 lb

## 2013-06-15 DIAGNOSIS — I6521 Occlusion and stenosis of right carotid artery: Secondary | ICD-10-CM

## 2013-06-15 DIAGNOSIS — I6529 Occlusion and stenosis of unspecified carotid artery: Secondary | ICD-10-CM

## 2013-06-15 DIAGNOSIS — R51 Headache: Secondary | ICD-10-CM

## 2013-06-15 MED ORDER — DICLOFENAC POTASSIUM(MIGRAINE) 50 MG PO PACK: 50.0000 mg | PACK | ORAL | Status: DC | PRN

## 2013-06-15 MED ORDER — ONDANSETRON HCL 4 MG PO TABS: 4.0000 mg | ORAL_TABLET | Freq: Three times a day (TID) | ORAL | Status: DC | PRN

## 2013-06-15 MED ORDER — OXYCODONE-ACETAMINOPHEN 5-325 MG PO TABS: 1.0000 | ORAL_TABLET | ORAL | Status: DC | PRN

## 2013-06-15 NOTE — Progress Notes (Signed)
[facility]  PATIENT: Danielle Farrell DOB: 24-Aug-1955  HISTORICAL Mrs. Collison is a 57 years old right-handed Caucasian female, accompined by her husband, referred by her prima [name] for evaluation of headaches  She has past medical history of hypertension, diabetes, hyperlipidemia, coronary artery disease, history of cardiac bypass surgery, subclavian artery bypass, transfemoral artery bypass, left renal artery bypass surgery,  She denied previous history of headaches, presenting with 2 severe episodes of headache since August 2014, both episode of headaches are similar, most severe one was in December 5th 2014, she finished dinner with her husband, 30 minutes later, she complains of gradual onset, rapid building up of a right retro-orbital headache, she took a couple tablets of aspirin, went to sleep, shortly afterwards, she got up, husband noticed she was confused, complaining of worsening headaches, nauseous vomitting, 4 hours later, she has become more confused combative, she was taken to the local hospital, her headache relieved by Dilaudid, but she remained confused until 48 hours later,  During her hospital stay, she had MRI of the brain, I have reviewed the film, there was chronic small vessel disease, chronic left cerebellar strokes, MRA of the brain showed intracranial atherosclerosis disease, no large vessel disease, MRA of neck showed moderate right internal carotid artery stenosis, 60%, patent left internal carotid artery, a proximal 30% proximal right vertebral artery stenosis, patent vertebral artery bilaterally otherwise   In between those 2 severe headaches, she denies significant visual change, no lateralized motor or sensory deficit,  She suffered left ptosis since her left subclavian artery bypass surgery,   REVIEW OF SYSTEMS: Full 14 system review of systems performed and notable only for blurred vision, cough, snoring, easy bruising, memory loss,  confusion, headaches, snoring, restless leg  ALLERGIES: Allergies  Allergen Reactions  . Erythromycin Nausea And Vomiting  . Lipitor [Atorvastatin Calcium]     Muscle cramps in legs    HOME MEDICATIONS: Outpatient Prescriptions Prior to Visit  Medication Sig Dispense Refill  . aspirin 81 MG EC tablet Take 81 mg by mouth daily.        Marland Kitchen glipiZIDE (GLUCOTROL) 5 MG tablet Take 2 tablets (10 mg total) by mouth 2 (two) times daily before a meal.  360 tablet  1  . lisinopril (PRINIVIL,ZESTRIL) 10 MG tablet Take 1 tablet (10 mg total) by mouth daily.  30 tablet  0  . metFORMIN (GLUCOPHAGE) 1000 MG tablet Take 1,000 mg by mouth 2 (two) times daily with a meal.      . PARoxetine (PAXIL) 20 MG tablet Take 1 tablet (20 mg total) by mouth every morning.  90 tablet  3  . rosuvastatin (CRESTOR) 10 MG tablet Take 10 mg by mouth daily after supper.      . metoprolol (LOPRESSOR) 50 MG tablet Take 0.5 tablets (25 mg total) by mouth 2 (two) times daily.      . magnesium oxide (MAG-OX) 400 MG tablet Take 400 mg by mouth daily as needed (migraine).      . naproxen (NAPROSYN) 500 MG tablet Take 500 mg by mouth every 12 (twelve) hours as needed (hip pain).      . prochlorperazine (COMPAZINE) 10 MG tablet Take 10 mg by mouth every 12 (twelve) hours as needed for nausea or vomiting.      . valproic acid (DEPAKENE) 250 MG capsule Take 250 mg by mouth daily as needed (migraine).       No facility-administered medications prior to visit.  PAST MEDICAL HISTORY: Past Medical History  Diagnosis Date  . Hyperlipidemia   . IHD (ischemic heart disease)   . PAOD (peripheral arterial occlusive disease)   . Carotid bruit   . Diabetes mellitus   . Hypertension   . PVD (peripheral vascular disease)   . Coronary artery disease   . Dizziness   . Persistent headaches   . Pneumonia Jan. 2014  . Bronchitis Jan 2014    became pneumonia    PAST SURGICAL HISTORY: Past Surgical History  Procedure Laterality Date    . Cardiac catheterization  04/28/2001  . 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 US echocardiography  07/16/2004    EF 55-60%  . Cardiovascular stress test  11/23/2006    EF 64%  . Carotid-subclavian bypass graft  2003    FAMILY HISTORY: Family History  Problem Relation Age of Onset  . Cancer Mother   . Cancer Father     SOCIAL HISTORY:  History   Social History  . Marital Status: Single    Spouse Name: [name]    Number of Children: 0  . Years of Education: 12   Occupational History  .      Disabled   Social History Main Topics  . Smoking status: Current Every Day Smoker -- 0.50 packs/day    Types: Cigarettes  . Smokeless tobacco: Never Used  . Alcohol Use: No  . Drug Use: No  . Sexual Activity: Not on file   Other Topics Concern  . Not on file   Social History Narrative   Patient lives at home with her husband [name]).   Disabled.   Right handed   Education 12 th grade   Caffeine- One cup of coffee daily.     PHYSICAL EXAM   Filed Vitals:   06/15/13 0857  BP: 112/70  Pulse: 63  Height: 5\' 4"  (1.626 m)  Weight: 142 lb (64.411 kg)    Not recorded    Body mass index is 24.36 kg/(m^2).   Generalized: In no acute distress  Neck: Supple, right carotid bruits   Cardiac: Regular rate rhythm  Pulmonary: Clear to auscultation bilaterally  Musculoskeletal: No deformity  Neurological examination  Mentation: Alert oriented to time, place, history taking, and causual conversation  Cranial nerve II-XII: She has left ptosis, left pupil is 1mm smaller than right pupil, her pupils were equal round reactive to light extraocular movements were full, Visual field were full on confrontational test. Bilateral fundi were sharp.  Facial  strength were normal. Hearing was intact to finger rubbing bilaterally. Uvula tongue midline.  head turning and shoulder shrug and were normal and symmetric.Tongue protrusion into cheek strength was normal.  Motor: normal  tone, bulk and strength.  Sensory: Intact to fine touch, pinprick, preserved vibratory sensation, and proprioception at toes.  Coordination: Normal finger to nose, heel-to-shin bilaterally there was no truncal ataxia  Gait: Rising up from seated position without assistance, normal stance, without trunk ataxia, moderate stride, good arm swing, smooth turning, able to perform tiptoe, and heel walking without difficulty.   Romberg signs: Negative  Deep tendon reflexes: Brachioradialis 2/2, biceps 2/2, triceps 2/2, patellar 2/2, Achilles 2/2, plantar responses were flexor bilaterally.   DIAGNOSTIC DATA (LABS, IMAGING, TESTING) - I reviewed patient records, labs, notes, testing and imaging myself where available.  Lab Results  Component Value Date   WBC 7.3 06/09/2013   HGB 13.6 06/09/2013   HCT 39.9 06/09/2013   MCV 89.5 06/09/2013   PLT  222 06/09/2013      Component Value Date/Time   NA 140 06/09/2013 0445   K 3.6 06/09/2013 0445   CL 101 06/09/2013 0445   CO2 24 06/09/2013 0445   GLUCOSE 126* 06/09/2013 0445   BUN 10 06/09/2013 0445   CREATININE 0.74 06/09/2013 0445   CALCIUM 9.4 06/09/2013 0445   PROT 7.5 06/08/2013 0740   ALBUMIN 3.9 06/08/2013 0740   AST 17 06/08/2013 0740   ALT 12 06/08/2013 0740   ALKPHOS 61 06/08/2013 0740   BILITOT 0.2* 06/08/2013 0740   GFRNONAA >90 06/09/2013 0445   GFRAA >90 06/09/2013 0445   Lab Results  Component Value Date   CHOL 164 05/18/2012   HDL 50.70 05/18/2012   LDLCALC 99 05/18/2012   TRIG 74.0 05/18/2012   CHOLHDL 3 05/18/2012   Lab Results  Component Value Date   HGBA1C 7.5* 05/18/2012   Lab Results  Component Value Date   VITAMINB12 313 06/08/2013   Lab Results  Component Value Date   TSH 0.504 06/08/2013   ASSESSMENT AND PLAN   57 years old Caucasian female, with multiple vascular risk factors, including smoking, hypertension, hyperlipidemia, coronary artery disease, peripheral vascular disease, presenting with new onset of headaches,  MRI of the brain and MRA of the brain reviewed, there is evidence of only mild small vessel disease, intra-cranial atherosclerotic disease, but no large vessel intracranial disease, MRA of neck showed short segment stenosis of approximately 60% within the proximal right internal carotid artery with mild multi focal irregularity distally. Widely patent left internal carotid artery without evidence of high-grade flow-limiting stenosis. Irregularity with short-segment stenosis of approximately 30% within the proximal right vertebral artery. Otherwise widely patent vertebral arteries bilaterally.   Her headache has some migraine features, this only happens intermittently, but severe during those episodes, the atypical features is her profound confusion during her was intense headaches,  DDx of complicated migraine, also need to rule out complex partial seizure, eeg  1. She is to continue addressing her multiple vascular risk factor, 2.  Cambia, zofran as needed for migraine abortive treatment percocet as needed. 3. RTC in 3 month with Gerlene Fee, M.D. Ph.D.  [facility] [address], [address] [place], [place] [address] [phone]

## 2013-06-18 ENCOUNTER — Encounter: Payer: Self-pay | Admitting: Cardiology

## 2013-06-18 ENCOUNTER — Ambulatory Visit (INDEPENDENT_AMBULATORY_CARE_PROVIDER_SITE_OTHER): Payer: Medicare Other | Admitting: Cardiology

## 2013-06-18 VITALS — BP 138/68 | HR 60 | Ht 64.0 in | Wt 145.6 lb

## 2013-06-18 DIAGNOSIS — I119 Hypertensive heart disease without heart failure: Secondary | ICD-10-CM

## 2013-06-18 DIAGNOSIS — Q796 Ehlers-Danlos syndrome, unspecified: Secondary | ICD-10-CM

## 2013-06-18 DIAGNOSIS — Z951 Presence of aortocoronary bypass graft: Secondary | ICD-10-CM

## 2013-06-18 DIAGNOSIS — I6523 Occlusion and stenosis of bilateral carotid arteries: Secondary | ICD-10-CM

## 2013-06-18 DIAGNOSIS — I6529 Occlusion and stenosis of unspecified carotid artery: Secondary | ICD-10-CM

## 2013-06-18 DIAGNOSIS — R51 Headache: Secondary | ICD-10-CM

## 2013-06-18 DIAGNOSIS — I658 Occlusion and stenosis of other precerebral arteries: Secondary | ICD-10-CM

## 2013-06-18 DIAGNOSIS — E7800 Pure hypercholesterolemia, unspecified: Secondary | ICD-10-CM

## 2013-06-18 NOTE — Assessment & Plan Note (Signed)
The patient has hyperelasticity of her joints.  We will update her echocardiogram looking for aortic dilatation and/or aortic valve insufficiency.

## 2013-06-18 NOTE — Addendum Note (Signed)
Addended by: Meda Klinefelter D on: 06/18/2013 01:55 PM   Modules accepted: Orders

## 2013-06-18 NOTE — Addendum Note (Signed)
Addended by: Meda Klinefelter D on: 06/18/2013 01:57 PM   Modules accepted: Orders

## 2013-06-18 NOTE — Assessment & Plan Note (Signed)
The patient has not been having a TIA symptoms 

## 2013-06-18 NOTE — Assessment & Plan Note (Signed)
The patient denies any chest pain or angina pectoris. 

## 2013-06-18 NOTE — Assessment & Plan Note (Signed)
The patient has exertional dyspnea which is chronic.  She has not been experiencing any dizziness or syncope.

## 2013-06-18 NOTE — Patient Instructions (Signed)
Your physician has requested that you have an echocardiogram. Echocardiography is a painless test that uses sound waves to create images of your heart. It provides your doctor with information about the size and shape of your heart and how well your heart's chambers and valves are working. This procedure takes approximately one hour. There are no restrictions for this procedure.      Continue same medications     Your physician wants you to follow-up in: 6 months with a ekg. You will receive a reminder letter in the mail two months in advance. If you don't receive a letter, please call our office to schedule the follow-up appointment.

## 2013-06-18 NOTE — Assessment & Plan Note (Signed)
Severe atypical migraines twice requiring hospitalization.  Now followed by neurology.

## 2013-06-18 NOTE — Progress Notes (Signed)
Danielle Farrell Date of Birth:  23-Dec-1955 [address] [place], [place]  [address] [phone]         Fax   [phone]  History of Present Illness: This pleasant 57 year old woman is seen for a scheduled followup office visit. She has a history of known ischemic heart disease. She had coronary artery bypass graft surgery on 05/03/01. She has a history of peripheral vascular disease. She is a history of carotid bruits followed by vascular surgery.  She is checked every February by Dr. Hart Rochester. She has high blood pressure high cholesterol and is diabetic. She smokes about 5 cigarettes a day.  She has a history of osteoarthritis of the right hip which has limited how much she is able to walk each day.  Since we last saw her about a year ago she has been hospitalized in August and again in December of this year for severe migraines associated with confusion and disorientation.  She is being followed by neurology.  The patient states that she has been diagnosed with Ehlers-Danlos syndrome by Dr. Jeannetta Nap.  The patient has hyperelasticity of her joints.  The patient is unaware of any other family members with this disease but her parents and members of that generation are deceased.   Current Outpatient Prescriptions  Medication Sig Dispense Refill  . aspirin 81 MG EC tablet Take 81 mg by mouth daily.        . Diclofenac Potassium (CAMBIA) 50 MG PACK Take 50 mg by mouth as needed.  30 each  3  . glipiZIDE (GLUCOTROL) 5 MG tablet Take 2 tablets (10 mg total) by mouth 2 (two) times daily before a meal.  360 tablet  1  . lisinopril (PRINIVIL,ZESTRIL) 10 MG tablet Take 1 tablet (10 mg total) by mouth daily.  30 tablet  0  . metFORMIN (GLUCOPHAGE) 1000 MG tablet Take 1,000 mg by mouth 2 (two) times daily with a meal.      . metoprolol (LOPRESSOR) 50 MG tablet Take 25 mg by mouth daily.      . ondansetron (ZOFRAN) 4 MG tablet Take 1 tablet (4 mg total) by mouth every 8 (eight) hours as needed  for nausea or vomiting.  30 tablet  3  . oxyCODONE-acetaminophen (PERCOCET/ROXICET) 5-325 MG per tablet Take 1 tablet by mouth every 4 (four) hours as needed for severe pain.  30 tablet  0  . PARoxetine (PAXIL) 20 MG tablet Take 1 tablet (20 mg total) by mouth every morning.  90 tablet  3  . rosuvastatin (CRESTOR) 10 MG tablet Take 10 mg by mouth daily after supper.       No current facility-administered medications for this visit.    Allergies  Allergen Reactions  . Erythromycin Nausea And Vomiting  . Lipitor [Atorvastatin Calcium]     Muscle cramps in legs    Patient Active Problem List   Diagnosis Date Noted  . Ehlers-Danlos disease 06/18/2013  . Acute encephalopathy 06/08/2013  . Headache(784.0) 02/24/2013  . Occlusion and stenosis of carotid artery without mention of cerebral infarction 08/15/2012  . Insomnia 05/18/2012  . Type II or unspecified type diabetes mellitus without mention of complication, uncontrolled 02/05/2011  . Benign hypertensive heart disease without heart failure 02/05/2011  . Hx of CABG 02/05/2011  . Carotid stenosis, asymptomatic 02/05/2011  . Hypercholesterolemia 02/05/2011    History  Smoking status  . Current Every Day Smoker -- 0.50 packs/day  . Types: Cigarettes  Smokeless tobacco  .  Never Used    History  Alcohol Use No    Family History  Problem Relation Age of Onset  . Cancer Mother   . Cancer Father     Review of Systems: Constitutional: no fever chills diaphoresis or fatigue or change in weight.  Head and neck: no hearing loss, no epistaxis, no photophobia or visual disturbance. Respiratory: No cough, shortness of breath or wheezing. Cardiovascular: No chest pain peripheral edema, palpitations. Gastrointestinal: No abdominal distention, no abdominal pain, no change in bowel habits hematochezia or melena. Genitourinary: No dysuria, no frequency, no urgency, no nocturia. Musculoskeletal:No arthralgias, no back pain, no gait  disturbance or myalgias. Neurological: No dizziness, no headaches, no numbness, no seizures, no syncope, no weakness, no tremors. Hematologic: No lymphadenopathy, no easy bruising. Psychiatric: No confusion, no hallucinations, no sleep disturbance.    Physical Exam: Filed Vitals:   06/18/13 0958  BP: 138/68  Pulse: 60   the general appearance reveals a well-developed well-nourished woman in no distress.The head and neck exam reveals pupils equal and reactive.  Extraocular movements are full.  There is no scleral icterus.  The mouth and pharynx are normal.  The neck is supple.  The carotids reveal soft carotid bruits.  The jugular venous pressure is normal.  The  thyroid is not enlarged.  There is no lymphadenopathy.  The chest is clear to percussion and auscultation.  There are no rales or rhonchi.  Expansion of the chest is symmetrical.  The precordium is quiet.  The first heart sound is normal.  The second heart sound is physiologically split.  There is no murmur gallop rub or click.  There is no abnormal lift or heave.  The abdomen is soft and nontender.  The bowel sounds are normal.  The liver and spleen are not enlarged.  There are no abdominal masses.  There are no abdominal bruits.  Extremities reveal weak pedal pulses.  There is no phlebitis or edema.  There is no cyanosis or clubbing.  Strength is normal and symmetrical in all extremities.  There is no lateralizing weakness.  There are no sensory deficits.  The skin is warm and dry.  There is no rash.     Assessment / Plan:  Continue same medication. Update her two-dimensional echocardiogram. Recheck in 6 months for office visit and EKG.

## 2013-06-22 ENCOUNTER — Ambulatory Visit (INDEPENDENT_AMBULATORY_CARE_PROVIDER_SITE_OTHER): Payer: Medicare Other | Admitting: Radiology

## 2013-06-22 ENCOUNTER — Telehealth: Payer: Self-pay | Admitting: Neurology

## 2013-06-22 DIAGNOSIS — R51 Headache: Secondary | ICD-10-CM

## 2013-06-22 DIAGNOSIS — I6521 Occlusion and stenosis of right carotid artery: Secondary | ICD-10-CM

## 2013-06-22 NOTE — Telephone Encounter (Signed)
Please give her a follow up with me in 1-2 months, cancel appt with Eber Jones

## 2013-06-22 NOTE — Procedures (Signed)
HISTORY: 56 years old female, presenting with new onset of headache, confusion, she has multiple vascular risk factors, including diabetes, hypertension, hyperlipidemia, coronary artery disease, smoker, peripheral vascular disease  TECHNIQUE:  16 channel EEG was performed based on standard 10-16 international system. One channel was dedicated to EKG, which has demonstrates bradycardia of 48 beats per minutes.  Upon awakening, the posterior background activity was mildly disarrythmic with mixed alpah and beta range activity,   reactive to eye opening and closure, there was frequent frontal muscle artifact  There was also mild asymmetry, there was occasionally sharp burst of higher amplitude slow 4-5 Hz activity involving F8, T4, T6, There was frequent sharp transient with face reversal at T4, also occasionally T3, T5 sharp transient, increased occurrence after hyperventilation,  Photic stimulation was performed, which induced a symmetric photic driving.  Hyperventilation was performed,  there is increased P4, C4 sharp transient post hyperventilation   She was able to achieve stage II sleep during recording as evidenced by sleep spindles  CONCLUSION: This is a abnormal awake and asleep EEG.  There is electrodiagnostic evidence of sharp spike waves, mostly involving right parietotemporal region, suggestive focal irritability

## 2013-07-04 ENCOUNTER — Other Ambulatory Visit: Payer: Self-pay

## 2013-07-04 ENCOUNTER — Encounter: Payer: Self-pay | Admitting: Cardiology

## 2013-07-04 ENCOUNTER — Ambulatory Visit (HOSPITAL_COMMUNITY): Payer: Medicare Other | Attending: Cardiology | Admitting: Radiology

## 2013-07-04 DIAGNOSIS — F17200 Nicotine dependence, unspecified, uncomplicated: Secondary | ICD-10-CM | POA: Insufficient documentation

## 2013-07-04 DIAGNOSIS — Z951 Presence of aortocoronary bypass graft: Secondary | ICD-10-CM

## 2013-07-04 DIAGNOSIS — I6529 Occlusion and stenosis of unspecified carotid artery: Secondary | ICD-10-CM | POA: Insufficient documentation

## 2013-07-04 DIAGNOSIS — I119 Hypertensive heart disease without heart failure: Secondary | ICD-10-CM

## 2013-07-04 DIAGNOSIS — I25810 Atherosclerosis of coronary artery bypass graft(s) without angina pectoris: Secondary | ICD-10-CM | POA: Insufficient documentation

## 2013-07-04 DIAGNOSIS — Q796 Ehlers-Danlos syndrome, unspecified: Secondary | ICD-10-CM

## 2013-07-04 DIAGNOSIS — I6523 Occlusion and stenosis of bilateral carotid arteries: Secondary | ICD-10-CM

## 2013-07-04 DIAGNOSIS — I059 Rheumatic mitral valve disease, unspecified: Secondary | ICD-10-CM | POA: Insufficient documentation

## 2013-07-04 DIAGNOSIS — E119 Type 2 diabetes mellitus without complications: Secondary | ICD-10-CM | POA: Insufficient documentation

## 2013-07-04 DIAGNOSIS — E785 Hyperlipidemia, unspecified: Secondary | ICD-10-CM | POA: Insufficient documentation

## 2013-07-04 DIAGNOSIS — R51 Headache: Secondary | ICD-10-CM

## 2013-07-04 DIAGNOSIS — I079 Rheumatic tricuspid valve disease, unspecified: Secondary | ICD-10-CM | POA: Insufficient documentation

## 2013-07-04 DIAGNOSIS — E7800 Pure hypercholesterolemia, unspecified: Secondary | ICD-10-CM

## 2013-07-04 MED ORDER — METOPROLOL TARTRATE 50 MG PO TABS: 25.0000 mg | ORAL_TABLET | Freq: Every day | ORAL | Status: DC

## 2013-07-04 MED ORDER — ROSUVASTATIN CALCIUM 10 MG PO TABS: 10.0000 mg | ORAL_TABLET | Freq: Every day | ORAL | Status: DC

## 2013-07-04 NOTE — Progress Notes (Signed)
Echocardiogram performed.  

## 2013-07-06 ENCOUNTER — Other Ambulatory Visit: Payer: Self-pay | Admitting: *Deleted

## 2013-07-06 ENCOUNTER — Other Ambulatory Visit: Payer: Self-pay

## 2013-07-06 MED ORDER — LISINOPRIL 10 MG PO TABS: 10.0000 mg | ORAL_TABLET | Freq: Every day | ORAL | Status: DC

## 2013-07-06 MED ORDER — METOPROLOL TARTRATE 50 MG PO TABS: 25.0000 mg | ORAL_TABLET | Freq: Every day | ORAL | Status: DC

## 2013-07-09 ENCOUNTER — Telehealth: Payer: Self-pay | Admitting: Neurology

## 2013-07-09 ENCOUNTER — Encounter: Payer: Self-pay | Admitting: Neurology

## 2013-07-09 MED ORDER — TOPIRAMATE 25 MG PO TABS: ORAL_TABLET | ORAL | Status: DC

## 2013-07-09 NOTE — Telephone Encounter (Signed)
Danielle Farrell, give her a follow up appt with me instead of Eber JonesCarolyn in 2-3 months

## 2013-07-09 NOTE — Telephone Encounter (Addendum)
I have called her, EEG showed irritability at right hemisphere. She has bronchitis, she still frequent headaches.   I will start her Topamax titrating to 25mg  ii po bid.

## 2013-07-10 ENCOUNTER — Telehealth: Payer: Self-pay

## 2013-07-10 NOTE — Telephone Encounter (Signed)
Will discuss diagnosis during follow up.

## 2013-07-10 NOTE — Telephone Encounter (Signed)
Called patient and spoke to her and she is scheduled with Dr.Yan at the end of January. Patient has requested for Dr.Yan to send her a request on my charting with the spelling of her Dx. Patient will still be in in January to see Dr.Yan.

## 2013-07-11 ENCOUNTER — Telehealth: Payer: Self-pay | Admitting: *Deleted

## 2013-07-11 ENCOUNTER — Encounter: Payer: Self-pay | Admitting: Neurology

## 2013-07-11 NOTE — Telephone Encounter (Signed)
Message copied by Burnell BlanksPRATT, Dorotha Hirschi B on Wed Jul 11, 2013  2:12 PM ------      Message from: Cassell ClementBRACKBILL, THOMAS      Created: Sat Jul 07, 2013  8:59 AM       Please report.  The echo was satisfactory. Normal systolic function with moderate diastolic dysfunction. Valves all working well. ------

## 2013-07-11 NOTE — Telephone Encounter (Signed)
Advised patient

## 2013-07-13 NOTE — Telephone Encounter (Signed)
I have called Danielle Farrell twice, left message, will explain eeg finding at her next follow up visit in Jan

## 2013-07-13 NOTE — Telephone Encounter (Signed)
Pt returning Dr. Zannie CoveYan's call she wants to know the name of the condition that she has

## 2013-07-17 ENCOUNTER — Encounter (HOSPITAL_COMMUNITY): Payer: Self-pay | Admitting: Emergency Medicine

## 2013-07-17 ENCOUNTER — Emergency Department (HOSPITAL_COMMUNITY)
Admission: EM | Admit: 2013-07-17 | Discharge: 2013-07-17 | Disposition: A | Discharge status: Home / Self Care | Payer: Medicare Other | Attending: Emergency Medicine | Admitting: Emergency Medicine

## 2013-07-17 DIAGNOSIS — R112 Nausea with vomiting, unspecified: Secondary | ICD-10-CM | POA: Insufficient documentation

## 2013-07-17 DIAGNOSIS — Z8701 Personal history of pneumonia (recurrent): Secondary | ICD-10-CM | POA: Insufficient documentation

## 2013-07-17 DIAGNOSIS — Z7982 Long term (current) use of aspirin: Secondary | ICD-10-CM | POA: Insufficient documentation

## 2013-07-17 DIAGNOSIS — G43909 Migraine, unspecified, not intractable, without status migrainosus: Secondary | ICD-10-CM | POA: Insufficient documentation

## 2013-07-17 DIAGNOSIS — Z951 Presence of aortocoronary bypass graft: Secondary | ICD-10-CM | POA: Insufficient documentation

## 2013-07-17 DIAGNOSIS — I259 Chronic ischemic heart disease, unspecified: Secondary | ICD-10-CM | POA: Insufficient documentation

## 2013-07-17 DIAGNOSIS — F17200 Nicotine dependence, unspecified, uncomplicated: Secondary | ICD-10-CM | POA: Insufficient documentation

## 2013-07-17 DIAGNOSIS — E785 Hyperlipidemia, unspecified: Secondary | ICD-10-CM | POA: Insufficient documentation

## 2013-07-17 DIAGNOSIS — I743 Embolism and thrombosis of arteries of the lower extremities: Secondary | ICD-10-CM | POA: Insufficient documentation

## 2013-07-17 DIAGNOSIS — Z888 Allergy status to other drugs, medicaments and biological substances status: Secondary | ICD-10-CM | POA: Insufficient documentation

## 2013-07-17 DIAGNOSIS — E119 Type 2 diabetes mellitus without complications: Secondary | ICD-10-CM | POA: Insufficient documentation

## 2013-07-17 DIAGNOSIS — I739 Peripheral vascular disease, unspecified: Secondary | ICD-10-CM | POA: Insufficient documentation

## 2013-07-17 DIAGNOSIS — I2510 Atherosclerotic heart disease of native coronary artery without angina pectoris: Secondary | ICD-10-CM | POA: Insufficient documentation

## 2013-07-17 DIAGNOSIS — H53149 Visual discomfort, unspecified: Secondary | ICD-10-CM | POA: Insufficient documentation

## 2013-07-17 DIAGNOSIS — Z8669 Personal history of other diseases of the nervous system and sense organs: Secondary | ICD-10-CM | POA: Insufficient documentation

## 2013-07-17 DIAGNOSIS — Z881 Allergy status to other antibiotic agents status: Secondary | ICD-10-CM | POA: Insufficient documentation

## 2013-07-17 DIAGNOSIS — I10 Essential (primary) hypertension: Secondary | ICD-10-CM | POA: Insufficient documentation

## 2013-07-17 DIAGNOSIS — R0989 Other specified symptoms and signs involving the circulatory and respiratory systems: Secondary | ICD-10-CM | POA: Insufficient documentation

## 2013-07-17 DIAGNOSIS — Z8709 Personal history of other diseases of the respiratory system: Secondary | ICD-10-CM | POA: Insufficient documentation

## 2013-07-17 DIAGNOSIS — Z79899 Other long term (current) drug therapy: Secondary | ICD-10-CM | POA: Insufficient documentation

## 2013-07-17 MED ORDER — PROCHLORPERAZINE EDISYLATE 5 MG/ML IJ SOLN
10.0000 mg | Freq: Once | INTRAMUSCULAR | Status: AC
  Administered 2013-07-17: 10 mg via INTRAVENOUS
  Filled 2013-07-17: qty 2

## 2013-07-17 MED ORDER — KETOROLAC TROMETHAMINE 30 MG/ML IJ SOLN
30.0000 mg | Freq: Once | INTRAMUSCULAR | Status: AC
  Administered 2013-07-17: 30 mg via INTRAVENOUS
  Filled 2013-07-17: qty 1

## 2013-07-17 MED ORDER — DEXAMETHASONE SODIUM PHOSPHATE 10 MG/ML IJ SOLN: 10.0000 mg | Freq: Once | INTRAMUSCULAR | Status: DC

## 2013-07-17 MED ORDER — DIPHENHYDRAMINE HCL 50 MG/ML IJ SOLN
25.0000 mg | Freq: Once | INTRAMUSCULAR | Status: AC
  Administered 2013-07-17: 25 mg via INTRAVENOUS
  Filled 2013-07-17: qty 1

## 2013-07-17 MED ORDER — MAGNESIUM SULFATE 40 MG/ML IJ SOLN
2.0000 g | Freq: Once | INTRAMUSCULAR | Status: AC
  Administered 2013-07-17: 2 g via INTRAVENOUS
  Filled 2013-07-17: qty 50

## 2013-07-17 MED ORDER — SODIUM CHLORIDE 0.9 % IV BOLUS (SEPSIS)
1000.0000 mL | Freq: Once | INTRAVENOUS | Status: AC
  Administered 2013-07-17: 1000 mL via INTRAVENOUS

## 2013-07-17 NOTE — Discharge Instructions (Signed)
Migraine Headache A migraine headache is an intense, throbbing pain on one or both sides of your head. A migraine can last for 30 minutes to several hours. CAUSES  The exact cause of a migraine headache is not always known. However, a migraine may be caused when nerves in the brain become irritated and release chemicals that cause inflammation. This causes pain. Certain things may also trigger migraines, such as:  Alcohol.  Smoking.  Stress.  Menstruation.  Aged cheeses.  Foods or drinks that contain nitrates, glutamate, aspartame, or tyramine.  Lack of sleep.  Chocolate.  Caffeine.  Hunger.  Physical exertion.  Fatigue.  Medicines used to treat chest pain (nitroglycerine), birth control pills, estrogen, and some blood pressure medicines. SIGNS AND SYMPTOMS  Pain on one or both sides of your head.  Pulsating or throbbing pain.  Severe pain that prevents daily activities.  Pain that is aggravated by any physical activity.  Nausea, vomiting, or both.  Dizziness.  Pain with exposure to bright lights, loud noises, or activity.  General sensitivity to bright lights, loud noises, or smells. Before you get a migraine, you may get warning signs that a migraine is coming (aura). An aura may include:  Seeing flashing lights.  Seeing bright spots, halos, or zig-zag lines.  Having tunnel vision or blurred vision.  Having feelings of numbness or tingling.  Having trouble talking.  Having muscle weakness. DIAGNOSIS  A migraine headache is often diagnosed based on:  Symptoms.  Physical exam.  A CT scan or MRI of your head. These imaging tests cannot diagnose migraines, but they can help rule out other causes of headaches. TREATMENT Medicines may be given for pain and nausea. Medicines can also be given to help prevent recurrent migraines.  HOME CARE INSTRUCTIONS  Only take over-the-counter or prescription medicines for pain or discomfort as directed by your  health care provider. The use of long-term narcotics is not recommended.  Lie down in a dark, quiet room when you have a migraine.  Keep a journal to find out what may trigger your migraine headaches. For example, write down:  What you eat and drink.  How much sleep you get.  Any change to your diet or medicines.  Limit alcohol consumption.  Quit smoking if you smoke.  Get 7 9 hours of sleep, or as recommended by your health care provider.  Limit stress.  Keep lights dim if bright lights bother you and make your migraines worse. SEEK IMMEDIATE MEDICAL CARE IF:   Your migraine becomes severe.  You have a fever.  You have a stiff neck.  You have vision loss.  You have muscular weakness or loss of muscle control.  You start losing your balance or have trouble walking.  You feel faint or pass out.  You have severe symptoms that are different from your first symptoms. MAKE SURE YOU:   Understand these instructions.  Will watch your condition.  Will get help right away if you are not doing well or get worse. Document Released: 06/21/2005 Document Revised: 04/11/2013 Document Reviewed: 02/26/2013 [employer] Patient Information 2014 [employer].  

## 2013-07-17 NOTE — ED Notes (Signed)
Pt states she has hx of migraines, has had one since yesterday with nausea/vomiting/diarrhea. Has seen neurologist, give medication, but not helping.

## 2013-07-17 NOTE — ED Provider Notes (Signed)
CSN: [id number]     Arrival date & time 07/17/13  1353 History   First MD Initiated Contact with Patient 07/17/13 1544     Chief Complaint  Patient presents with  . Emesis  . Migraine   HPI  58 y/o female with history of migraines as well as history noted below who presents with cc headache. Symptoms began two days ago. Headache is right sided and described as sharp and pulsating. Similar to previous migraines. Currently a 9/10. Has had worse headaches previously. Has had associated nausea, non-bloody non-bilious vomiting and photophobia. She denies any numbness or weakness.   Past Medical History  Diagnosis Date  . Hyperlipidemia   . IHD (ischemic heart disease)   . PAOD (peripheral arterial occlusive disease)   . Carotid bruit   . Diabetes mellitus   . Hypertension   . PVD (peripheral vascular disease)   . Coronary artery disease   . Dizziness   . Persistent headaches   . Pneumonia Jan. 2014  . Bronchitis Jan 2014    became pneumonia   Past Surgical History  Procedure Laterality Date  . Cardiac catheterization  04/28/2001  . 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 [place] echocardiography  07/16/2004    EF 55-60%  . Cardiovascular stress test  11/23/2006    EF 64%  . Carotid-subclavian bypass graft  2003   Family History  Problem Relation Age of Onset  . Cancer Mother   . Cancer Father    History  Substance Use Topics  . Smoking status: Current Every Day Smoker -- 0.50 packs/day    Types: Cigarettes  . Smokeless tobacco: Never Used  . Alcohol Use: No   OB History   Grav Para Term Preterm Abortions TAB SAB Ect Mult Living                 Review of Systems  Constitutional: Negative for fever and chills.  Eyes: Positive for photophobia.  Respiratory: Negative for shortness of breath.   Cardiovascular: Negative for chest pain.  Gastrointestinal: Positive for nausea and vomiting. Negative for abdominal pain.  Neurological: Positive for headaches. Negative for  weakness and numbness.  All other systems reviewed and are negative.   Allergies  Erythromycin and Lipitor  Home Medications   Current Outpatient Rx  Name  Route  Sig  Dispense  Refill  . aspirin 81 MG EC tablet   Oral   Take 81 mg by mouth daily.           Marland Kitchen. glipiZIDE (GLUCOTROL) 5 MG tablet   Oral   Take 2 tablets (10 mg total) by mouth 2 (two) times daily before a meal.   360 tablet   1   . lisinopril (PRINIVIL,ZESTRIL) 10 MG tablet   Oral   Take 1 tablet (10 mg total) by mouth daily.   90 tablet   1   . metFORMIN (GLUCOPHAGE) 1000 MG tablet   Oral   Take 1,000 mg by mouth 2 (two) times daily with a meal.         . metoprolol (LOPRESSOR) 50 MG tablet   Oral   Take 0.5 tablets (25 mg total) by mouth daily.   90 tablet   3     Place on hold   . ondansetron (ZOFRAN) 4 MG tablet   Oral   Take 1 tablet (4 mg total) by mouth every 8 (eight) hours as needed for nausea or vomiting.   30 tablet   3   .  PARoxetine (PAXIL) 20 MG tablet   Oral   Take 1 tablet (20 mg total) by mouth every morning.   90 tablet   3   . rosuvastatin (CRESTOR) 10 MG tablet   Oral   Take 1 tablet (10 mg total) by mouth daily after supper.   30 tablet   6   . topiramate (TOPAMAX) 25 MG tablet   Oral   Take 25 mg by mouth daily.          BP 109/52  Pulse 73  Temp(Src) 97.9 F (36.6 C) (Oral)  Resp 16  Wt 137 lb (62.143 kg)  SpO2 96% Physical Exam  Nursing note and vitals reviewed. Constitutional: She is oriented to person, place, and time. She appears well-developed and well-nourished. No distress.  HENT:  Head: Normocephalic and atraumatic.  Eyes: Conjunctivae are normal. Pupils are equal, round, and reactive to light.  Neck: Normal range of motion. Neck supple.  Cardiovascular: Normal rate and regular rhythm.  Exam reveals no gallop and no friction rub.   No murmur heard. Pulmonary/Chest: Effort normal and breath sounds normal.  Abdominal: Soft. She exhibits no  distension. There is no tenderness.  Musculoskeletal: Normal range of motion. She exhibits no edema and no tenderness.  Neurological: She is alert and oriented to person, place, and time. She has normal strength and normal reflexes. No cranial nerve deficit or sensory deficit.  Skin: Skin is warm and dry.  Psychiatric: She has a normal mood and affect.   ED Course  Procedures (including critical care time) Labs Review Labs Reviewed - No data to display Imaging Review No results found.  EKG Interpretation   None      MDM   1. Migraine     Here with HA typical of previous migraines. Afebrile. VSS. Non-focal neurological exam. Doubt ICH. Doubt meningitis. Doubt cerebral venous thrombosis. Likely migraine. Given migraine cocktail with near resolution of headache. The patient stated she was ready to leave and would like to go home to rest. Return precautions given and discussed with the patient who was in agreement with the plan.    Shanon Ace, MD 07/17/13 [phone]

## 2013-07-21 NOTE — ED Provider Notes (Signed)
I saw and evaluated the patient, reviewed the resident's note and I agree with the findings and plan.  EKG Interpretation   None         Rolland PorterMark Chrisma Hurlock, MD 07/21/13 [phone]

## 2013-08-03 ENCOUNTER — Encounter: Payer: Self-pay | Admitting: Neurology

## 2013-08-03 ENCOUNTER — Ambulatory Visit (INDEPENDENT_AMBULATORY_CARE_PROVIDER_SITE_OTHER): Payer: Medicare Other | Admitting: Neurology

## 2013-08-03 VITALS — BP 115/76 | HR 56 | Ht 64.0 in | Wt 140.0 lb

## 2013-08-03 DIAGNOSIS — I119 Hypertensive heart disease without heart failure: Secondary | ICD-10-CM

## 2013-08-03 DIAGNOSIS — R51 Headache: Secondary | ICD-10-CM

## 2013-08-03 DIAGNOSIS — I6529 Occlusion and stenosis of unspecified carotid artery: Secondary | ICD-10-CM

## 2013-08-03 MED ORDER — TOPIRAMATE 100 MG PO TABS: 100.0000 mg | ORAL_TABLET | Freq: Two times a day (BID) | ORAL | Status: DC

## 2013-08-03 MED ORDER — OXYCODONE-ACETAMINOPHEN 5-325 MG PO TABS: 1.0000 | ORAL_TABLET | ORAL | Status: DC | PRN

## 2013-08-03 NOTE — Progress Notes (Signed)
[facility]  PATIENT: Danielle Farrell DOB: 10-Sep-1955  HISTORICAL [name] is a 58 years old right-handed Caucasian female, accompined by her husband, referred by her prima [name] for evaluation of headaches  She has past medical history of hypertension, diabetes, hyperlipidemia, coronary artery disease, history of cardiac bypass surgery, subclavian artery bypass, transfemoral artery bypass, left renal artery bypass surgery,  She denied previous history of headaches, presenting with 2 severe episodes of headache since August 2014, both episode of headaches are similar, most severe one was in December 5th 2014, she finished dinner with her husband, 30 minutes later, she complains of gradual onset, rapid building up of a right retro-orbital headache, she took a couple tablets of aspirin, went to sleep, shortly afterwards, she got up, husband noticed she was confused, complaining of worsening headaches, nauseous vomitting, 4 hours later, she has become more confused combative, she was taken to the local hospital, her headache relieved by Dilaudid, but she remained confused until 48 hours later,  During her hospital stay, she had MRI of the brain, I have reviewed the film, there was chronic small vessel disease, chronic left cerebellar strokes, MRA of the brain showed intracranial atherosclerosis disease, no large vessel disease, MRA of neck showed moderate right internal carotid artery stenosis, 60%, patent left internal carotid artery, a proximal 30% proximal right vertebral artery stenosis, patent vertebral artery bilaterally otherwise   In between those 2 severe headaches, she denies significant visual change, no lateralized motor or sensory deficit,  She suffered left ptosis since her left subclavian artery bypass surgery,  UPDATE Jan 30th 2015: She suffered her third similar episode again in January 13th 2015, right retrorbital area severe headaches, with associated  confusion, we have reviewed MRA together, there is intracranial atherosclerotic disease,  MRA of the neck showed mild to moderate right internal carotid artery stenosis, but still patent,  She also complains of frequent right-sided neck pain, radiating pain to her right occipital region mild unsteady gait,    REVIEW OF SYSTEMS: Full 14 system review of systems performed and notable only for blurred vision, cough, snoring, easy bruising, memory loss, confusion, headaches, snoring, restless leg  ALLERGIES: Allergies  Allergen Reactions  . Erythromycin Nausea And Vomiting  . Lipitor [Atorvastatin Calcium]     Muscle cramps in legs    HOME MEDICATIONS: Outpatient Prescriptions Prior to Visit  Medication Sig Dispense Refill  . aspirin 81 MG EC tablet Take 81 mg by mouth daily.        Marland Kitchen glipiZIDE (GLUCOTROL) 5 MG tablet Take 2 tablets (10 mg total) by mouth 2 (two) times daily before a meal.  360 tablet  1  . lisinopril (PRINIVIL,ZESTRIL) 10 MG tablet Take 1 tablet (10 mg total) by mouth daily.  90 tablet  1  . metFORMIN (GLUCOPHAGE) 1000 MG tablet Take 1,000 mg by mouth 2 (two) times daily with a meal.      . metoprolol (LOPRESSOR) 50 MG tablet Take 0.5 tablets (25 mg total) by mouth daily.  90 tablet  3  . ondansetron (ZOFRAN) 4 MG tablet Take 1 tablet (4 mg total) by mouth every 8 (eight) hours as needed for nausea or vomiting.  30 tablet  3  . PARoxetine (PAXIL) 20 MG tablet Take 1 tablet (20 mg total) by mouth every morning.  90 tablet  3  . rosuvastatin (CRESTOR) 10 MG tablet Take 1 tablet (10 mg total) by mouth daily after supper.  30 tablet  6  .  topiramate (TOPAMAX) 25 MG tablet Take 25 mg by mouth daily.       No facility-administered medications prior to visit.    PAST MEDICAL HISTORY: Past Medical History  Diagnosis Date  . Hyperlipidemia   . IHD (ischemic heart disease)   . PAOD (peripheral arterial occlusive disease)   . Carotid bruit   . Diabetes mellitus   .  Hypertension   . PVD (peripheral vascular disease)   . Coronary artery disease   . Dizziness   . Persistent headaches   . Pneumonia Jan. 2014  . Bronchitis Jan 2014    became pneumonia    PAST SURGICAL HISTORY: Past Surgical History  Procedure Laterality Date  . Cardiac catheterization  04/28/2001  . 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 US echocardiography  07/16/2004    EF 55-60%  . Cardiovascular stress test  11/23/2006    EF 64%  . Carotid-subclavian bypass graft  2003    FAMILY HISTORY: Family History  Problem Relation Age of Onset  . Cancer Mother   . Cancer Father     SOCIAL HISTORY:  History   Social History  . Marital Status: Single    Spouse Name: [name]    Number of Children: 0  . Years of Education: 12   Occupational History  .      Disabled   Social History Main Topics  . Smoking status: Current Every Day Smoker -- 0.50 packs/day    Types: Cigarettes  . Smokeless tobacco: Never Used  . Alcohol Use: No  . Drug Use: No  . Sexual Activity: Not on file   Other Topics Concern  . Not on file   Social History Narrative   Patient lives at home with her husband [name]).   Disabled.   Right handed   Education 12 th grade   Caffeine- One cup of coffee daily.     PHYSICAL EXAM   Filed Vitals:   08/03/13 1002  BP: 115/76  Pulse: 56  Height: 5\' 4"  (1.626 m)  Weight: 140 lb (63.504 kg)    Not recorded    Body mass index is 24.02 kg/(m^2).   Generalized: In no acute distress  Neck: Supple, right carotid bruits   Cardiac: Regular rate rhythm  Pulmonary: Clear to auscultation bilaterally  Musculoskeletal: No deformity  Neurological examination  Mentation: Alert oriented to time, place, history taking, and causual conversation  Cranial nerve II-XII: She has left ptosis, left pupil is 1mm smaller than right pupil, her pupils were equal round reactive to light extraocular movements were full, Visual field were full on  confrontational test. Bilateral fundi were sharp.  Facial  strength were normal. Hearing was intact to finger rubbing bilaterally. Uvula tongue midline.  head turning and shoulder shrug and were normal and symmetric.Tongue protrusion into cheek strength was normal.  Motor: normal tone, bulk and strength.  Sensory: Intact to fine touch, pinprick, preserved vibratory sensation, and proprioception at toes.  Coordination: Normal finger to nose, heel-to-shin bilaterally there was no truncal ataxia  Gait: Rising up from seated position without assistance, normal stance, without trunk ataxia, moderate stride, good arm swing, smooth turning, able to perform tiptoe, and heel walking without difficulty.   Romberg signs: Negative  Deep tendon reflexes: Brachioradialis 3/3, biceps 3/3, triceps3/3 Patellar 3/3, Achilles 2/2, plantar responses were flexor bilaterally.   DIAGNOSTIC DATA (LABS, IMAGING, TESTING) - I reviewed patient records, labs, notes, testing and imaging myself where available.  Lab Results  Component Value  Date   WBC 7.3 06/09/2013   HGB 13.6 06/09/2013   HCT 39.9 06/09/2013   MCV 89.5 06/09/2013   PLT 222 06/09/2013      Component Value Date/Time   NA 140 06/09/2013 0445   K 3.6 06/09/2013 0445   CL 101 06/09/2013 0445   CO2 24 06/09/2013 0445   GLUCOSE 126* 06/09/2013 0445   BUN 10 06/09/2013 0445   CREATININE 0.74 06/09/2013 0445   CALCIUM 9.4 06/09/2013 0445   PROT 7.5 06/08/2013 0740   ALBUMIN 3.9 06/08/2013 0740   AST 17 06/08/2013 0740   ALT 12 06/08/2013 0740   ALKPHOS 61 06/08/2013 0740   BILITOT 0.2* 06/08/2013 0740   GFRNONAA >90 06/09/2013 0445   GFRAA >90 06/09/2013 0445   Lab Results  Component Value Date   CHOL 164 05/18/2012   HDL 50.70 05/18/2012   LDLCALC 99 05/18/2012   TRIG 74.0 05/18/2012   CHOLHDL 3 05/18/2012   Lab Results  Component Value Date   HGBA1C 7.5* 05/18/2012   Lab Results  Component Value Date   VITAMINB12 313 06/08/2013   Lab Results    Component Value Date   TSH 0.504 06/08/2013   ASSESSMENT AND PLAN   58 years old Caucasian female, with multiple vascular risk factors, including smoking, hypertension, hyperlipidemia, coronary artery disease, peripheral vascular disease, presenting with new onset of headaches right sided neck pain, on examination, she has hyperreflexia   1 MRI of cervical spine to rule out cervical spondylitic myelopathy 2 ESR C. reactive protein.   3 return to clinic in one to 2 months  4 prescription for hydrocodone 30 tablets. Levert Feinstein, M.D. Ph.D.  [facility] [address], [address] [place], [place] [address] [phone]

## 2013-08-04 LAB — SEDIMENTATION RATE: Sed Rate: 20 mm/hr (ref 0–40)

## 2013-08-04 LAB — C-REACTIVE PROTEIN: CRP: 0.9 mg/L (ref 0.0–4.9)

## 2013-08-06 ENCOUNTER — Telehealth: Payer: Self-pay | Admitting: Cardiology

## 2013-08-06 NOTE — Telephone Encounter (Signed)
ERROR//SR  °

## 2013-08-07 ENCOUNTER — Encounter: Payer: Self-pay | Admitting: Cardiology

## 2013-08-07 ENCOUNTER — Ambulatory Visit (INDEPENDENT_AMBULATORY_CARE_PROVIDER_SITE_OTHER): Payer: Medicare Other | Admitting: Cardiology

## 2013-08-07 VITALS — BP 192/82 | HR 67 | Ht 64.0 in | Wt 141.8 lb

## 2013-08-07 DIAGNOSIS — Z951 Presence of aortocoronary bypass graft: Secondary | ICD-10-CM

## 2013-08-07 DIAGNOSIS — R51 Headache: Secondary | ICD-10-CM

## 2013-08-07 DIAGNOSIS — E7800 Pure hypercholesterolemia, unspecified: Secondary | ICD-10-CM

## 2013-08-07 DIAGNOSIS — I119 Hypertensive heart disease without heart failure: Secondary | ICD-10-CM

## 2013-08-07 MED ORDER — METOPROLOL TARTRATE 50 MG PO TABS: ORAL_TABLET | ORAL | Status: DC

## 2013-08-07 MED ORDER — LISINOPRIL 10 MG PO TABS: 10.0000 mg | ORAL_TABLET | Freq: Two times a day (BID) | ORAL | Status: DC

## 2013-08-07 NOTE — Assessment & Plan Note (Signed)
The patient has not been having any chest pain or recurrent angina.

## 2013-08-07 NOTE — Patient Instructions (Addendum)
INCREASE YOUR LOPRESSOR (METOPROLOL) 50 MG TO 1/2 TABLET TWICE A DAY   INCREASE YOUR LISINOPRIL TO 10 MG TWICE A DAY  Your physician wants you to follow-up in: 3 months with fasting labs (lp/bmet/hfp) You will receive a reminder letter in the mail two months in advance. If you don't receive a letter, please call our office to schedule the follow-up appointment.

## 2013-08-07 NOTE — Assessment & Plan Note (Signed)
Her blood pressure is running high.  We are going to increase her metoprolol tartrate up to 25 mg twice a day and we're increasing her lisinopril up to 10 mg twice a day

## 2013-08-07 NOTE — Progress Notes (Signed)
Danielle Farrell Date of Birth:  1956/05/30 [address] [place], [place]  [address] [phone]         Fax   [phone]  History of Present Illness: This pleasant 58 year old woman is seen for a work in office visit.  She comes in because of concern over her blood pressure and frequent headaches. She has a history of known ischemic heart disease. She had coronary artery bypass graft surgery on 05/03/01. She has a history of peripheral vascular disease. She is a history of carotid bruits followed by vascular surgery.  She is checked every February by Dr. Hart Rochester. She has high blood pressure high cholesterol and is diabetic. She smokes about 5 cigarettes a day.  She has a history of osteoarthritis of the right hip which has limited how much she is able to walk each day.  Since we last saw her about a year ago she has been hospitalized in August and again in December of this year for severe migraines associated with confusion and disorientation.  She is being followed by neurology.  The patient states that she has been diagnosed with Ehlers-Danlos syndrome by Dr. Jeannetta Nap.  The patient has hyperelasticity of her joints.  The patient is unaware of any other family members with this disease but her parents and members of that generation are deceased.   Current Outpatient Prescriptions  Medication Sig Dispense Refill  . aspirin 81 MG EC tablet Take 81 mg by mouth daily.        . diclofenac (CATAFLAM) 50 MG tablet Take 50 mg by mouth as needed.      Marland Kitchen glipiZIDE (GLUCOTROL) 5 MG tablet Take 2 tablets (10 mg total) by mouth 2 (two) times daily before a meal.  360 tablet  1  . lisinopril (PRINIVIL,ZESTRIL) 10 MG tablet Take 1 tablet (10 mg total) by mouth 2 (two) times daily.  180 tablet  3  . metFORMIN (GLUCOPHAGE) 1000 MG tablet Take 1,000 mg by mouth 2 (two) times daily with a meal.      . metoprolol (LOPRESSOR) 50 MG tablet 1/2 TWICE A DAY  90 tablet  3  . ondansetron (ZOFRAN) 4 MG  tablet Take 1 tablet (4 mg total) by mouth every 8 (eight) hours as needed for nausea or vomiting.  30 tablet  3  . oxyCODONE-acetaminophen (PERCOCET/ROXICET) 5-325 MG per tablet Take 1 tablet by mouth as needed.  30 tablet  0  . PARoxetine (PAXIL) 20 MG tablet Take 1 tablet (20 mg total) by mouth every morning.  90 tablet  3  . rosuvastatin (CRESTOR) 10 MG tablet Take 1 tablet (10 mg total) by mouth daily after supper.  30 tablet  6  . topiramate (TOPAMAX) 100 MG tablet Take 1 tablet (100 mg total) by mouth 2 (two) times daily.  60 tablet  12   No current facility-administered medications for this visit.    Allergies  Allergen Reactions  . Erythromycin Nausea And Vomiting    Throw up  . Lipitor [Atorvastatin Calcium]     Muscle cramps in legs    Patient Active Problem List   Diagnosis Date Noted  . Ehlers-Danlos disease 06/18/2013  . Acute encephalopathy 06/08/2013  . Headache(784.0) 02/24/2013  . Occlusion and stenosis of carotid artery without mention of cerebral infarction 08/15/2012  . Insomnia 05/18/2012  . Type II or unspecified type diabetes mellitus without mention of complication, uncontrolled 02/05/2011  . Benign hypertensive heart disease without heart failure 02/05/2011  .  Hx of CABG 02/05/2011  . Carotid stenosis, asymptomatic 02/05/2011  . Hypercholesterolemia 02/05/2011    History  Smoking status  . Current Every Day Smoker -- 0.50 packs/day  . Types: Cigarettes  Smokeless tobacco  . Never Used    History  Alcohol Use No    Family History  Problem Relation Age of Onset  . Cancer Mother   . Cancer Father     Review of Systems: Constitutional: no fever chills diaphoresis or fatigue or change in weight.  Head and neck: no hearing loss, no epistaxis, no photophobia or visual disturbance. Respiratory: No cough, shortness of breath or wheezing. Cardiovascular: No chest pain peripheral edema, palpitations. Gastrointestinal: No abdominal distention, no  abdominal pain, no change in bowel habits hematochezia or melena. Genitourinary: No dysuria, no frequency, no urgency, no nocturia. Musculoskeletal:No arthralgias, no back pain, no gait disturbance or myalgias. Neurological: No dizziness, no headaches, no numbness, no seizures, no syncope, no weakness, no tremors. Hematologic: No lymphadenopathy, no easy bruising. Psychiatric: No confusion, no hallucinations, no sleep disturbance.    Physical Exam: Filed Vitals:   08/07/13 1357  BP: 192/82  Pulse: 67   the general appearance reveals a well-developed well-nourished woman in no distress.The head and neck exam reveals pupils equal and reactive.  Extraocular movements are full.  There is no scleral icterus.  The mouth and pharynx are normal.  The neck is supple.  The carotids reveal soft carotid bruits.  The jugular venous pressure is normal.  The  thyroid is not enlarged.  There is no lymphadenopathy.  The chest is clear to percussion and auscultation.  There are no rales or rhonchi.  Expansion of the chest is symmetrical.  The precordium is quiet.  The first heart sound is normal.  The second heart sound is physiologically split.  There is no murmur gallop rub or click.  There is no abnormal lift or heave.  The abdomen is soft and nontender.  The bowel sounds are normal.  The liver and spleen are not enlarged.  There are no abdominal masses.  There are no abdominal bruits.  Extremities reveal weak pedal pulses.  There is no phlebitis or edema.  There is no cyanosis or clubbing.  Strength is normal and symmetrical in all extremities.  There is no lateralizing weakness.  There are no sensory deficits.  The skin is warm and dry.  There is no rash.     Assessment / Plan:  Continue same medication except to increase her metoprolol and her lisinopril as noted above.  Unfortunately she has started to smoke cigarettes again.  I urged her to quit smoking because of her extensive vascular disease. She  needs to continue close followup with neurology regarding her headaches.  Recheck here in about June for followup office visit and fasting lab work and EKG

## 2013-08-07 NOTE — Assessment & Plan Note (Signed)
The patient is having severe intractable posterior occipital headaches.  The headaches start in the right posterior area and then seemed to radiate to behind the right eye.  She has been experiencing photophobia.  The headaches are described as a pulsating discomfort in her right eye.  She is in the midst of a workup by her neurologist Dr. Terrace ArabiaYan.  She has had an abnormal EEG.  She has had an MRI of the brain and she is scheduled to have an MRI of the neck.

## 2013-08-10 ENCOUNTER — Ambulatory Visit
Admission: RE | Admit: 2013-08-10 | Discharge: 2013-08-10 | Disposition: A | Discharge status: Home / Self Care | Payer: Medicare Other | Source: Ambulatory Visit | Attending: Neurology | Admitting: Neurology

## 2013-08-10 DIAGNOSIS — M542 Cervicalgia: Secondary | ICD-10-CM

## 2013-08-10 DIAGNOSIS — I119 Hypertensive heart disease without heart failure: Secondary | ICD-10-CM

## 2013-08-10 DIAGNOSIS — R51 Headache: Secondary | ICD-10-CM

## 2013-08-10 DIAGNOSIS — I6529 Occlusion and stenosis of unspecified carotid artery: Secondary | ICD-10-CM

## 2013-08-13 NOTE — Telephone Encounter (Signed)
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  ESR C. reactive protein was normal We can review her MRI films together on her followup visit

## 2013-08-20 ENCOUNTER — Encounter: Payer: Self-pay | Admitting: Family

## 2013-08-21 ENCOUNTER — Encounter: Payer: Self-pay | Admitting: Family

## 2013-08-22 ENCOUNTER — Ambulatory Visit: Payer: Medicare Other | Admitting: Family

## 2013-08-22 ENCOUNTER — Other Ambulatory Visit (HOSPITAL_COMMUNITY): Payer: Self-pay

## 2013-09-07 ENCOUNTER — Ambulatory Visit (INDEPENDENT_AMBULATORY_CARE_PROVIDER_SITE_OTHER): Payer: Medicare Other | Admitting: Neurology

## 2013-09-07 ENCOUNTER — Encounter: Payer: Self-pay | Admitting: Neurology

## 2013-09-07 VITALS — BP 133/75 | HR 74 | Ht 64.0 in | Wt 139.0 lb

## 2013-09-07 DIAGNOSIS — R51 Headache: Secondary | ICD-10-CM

## 2013-09-07 DIAGNOSIS — M542 Cervicalgia: Secondary | ICD-10-CM | POA: Insufficient documentation

## 2013-09-07 MED ORDER — OXYCODONE-ACETAMINOPHEN 5-325 MG PO TABS: 1.0000 | ORAL_TABLET | ORAL | Status: DC | PRN

## 2013-09-07 MED ORDER — DIVALPROEX SODIUM ER 500 MG PO TB24: 250.0000 mg | ORAL_TABLET | Freq: Every day | ORAL | Status: DC

## 2013-09-07 MED ORDER — CYCLOBENZAPRINE HCL 10 MG PO TABS: 10.0000 mg | ORAL_TABLET | Freq: Three times a day (TID) | ORAL | Status: DC | PRN

## 2013-09-07 NOTE — Progress Notes (Signed)
[facility]  PATIENT: Danielle Farrell DOB: [birth year]  HISTORICAL Mrs. Trama is a 58 years old right-handed Caucasian female, accompined by her husband, referred by her prima [name] for evaluation of headaches  She has past medical history of hypertension, diabetes, hyperlipidemia, coronary artery disease, history of cardiac bypass surgery, subclavian artery bypass, transfemoral artery bypass, left renal artery bypass surgery,  She denied previous history of headaches, presenting with 2 severe episodes of headache since August 2014, both episode of headaches are similar, most severe one was in December 5th 2014, she finished dinner with her husband, 30 minutes later, she complains of gradual onset, rapid building up of a right retro-orbital headache, she took a couple tablets of aspirin, went to sleep, shortly afterwards, she got up, husband noticed she was confused, complaining of worsening headaches, nauseous vomitting, 4 hours later, she has become more confused combative, she was taken to the local hospital, her headache relieved by Dilaudid, but she remained confused until 48 hours later,  During her hospital stay, she had MRI of the brain, I have reviewed the film, there was chronic small vessel disease, chronic left cerebellar strokes, MRA of the brain showed intracranial atherosclerosis disease, no large vessel disease, MRA of neck showed moderate right internal carotid artery stenosis, 60%, patent left internal carotid artery, a proximal 30% proximal right vertebral artery stenosis, patent vertebral artery bilaterally otherwise   In between those 2 severe headaches, she denies significant visual change, no lateralized motor or sensory deficit,  She suffered left ptosis since her left subclavian artery bypass surgery,  She suffered her third similar episode again in January 13th 2015, right retrorbital area severe headaches, with associated confusion,  MRA of  brain,  there is intracranial atherosclerotic disease,  MRA of the neck showed mild to moderate right internal carotid artery stenosis, but still patent,  She also complains of frequent right-sided neck pain, radiating pain to her right occipital region mild unsteady gait,   UPDATE March 6th 2015: MRI scan of the cervical spine showing 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  She does complain frequent neck pain, especially right side, her headache also started from right upper nuchal, occipital area, spreading forward to become more severe pounding headaches with associated light noise sensitivity  She could not tolerate Topamax, complains decreased appetite, alternation of taste, bilateral hands and feet paresthesia,  She also reported episode of lightheadedness, near fainting when she first got up from seated position, sometimes her headache is so severe, is associated with visual distortion, zigzag light in her visual field.  REVIEW OF SYSTEMS: Full 14 system review of systems performed and notable only for neck pain, dizziness decreased appetite, ringing in ears, trouble swallowing, light sensitivity, rashes tobacco, snoring, sleep walking, acting out of dreams, bruise easily, memory loss, dizziness, headaches, numbness,  ALLERGIES: Allergies  Allergen Reactions  . Erythromycin Nausea And Vomiting    Throw up  . Lipitor [Atorvastatin Calcium]     Muscle cramps in legs    HOME MEDICATIONS: Outpatient Prescriptions Prior to Visit  Medication Sig Dispense Refill  . aspirin 81 MG EC tablet Take 81 mg by mouth daily.        . diclofenac (CATAFLAM) 50 MG tablet Take 50 mg by mouth as needed.      Marland Kitchen glipiZIDE (GLUCOTROL) 5 MG tablet Take 2 tablets (10 mg  total) by mouth 2 (two) times daily before a meal.  360 tablet  1  . lisinopril (PRINIVIL,ZESTRIL) 10 MG  tablet Take 1 tablet (10 mg total) by mouth 2 (two) times daily.  180 tablet  3  . metFORMIN (GLUCOPHAGE) 1000 MG tablet Take 1,000 mg by mouth 2 (two) times daily with a meal.      . metoprolol (LOPRESSOR) 50 MG tablet 1/2 TWICE A DAY  90 tablet  3  . ondansetron (ZOFRAN) 4 MG tablet Take 1 tablet (4 mg total) by mouth every 8 (eight) hours as needed for nausea or vomiting.  30 tablet  3  . oxyCODONE-acetaminophen (PERCOCET/ROXICET) 5-325 MG per tablet Take 1 tablet by mouth as needed.  30 tablet  0  . PARoxetine (PAXIL) 20 MG tablet Take 1 tablet (20 mg total) by mouth every morning.  90 tablet  3  . rosuvastatin (CRESTOR) 10 MG tablet Take 1 tablet (10 mg total) by mouth daily after supper.  30 tablet  6  . topiramate (TOPAMAX) 100 MG tablet Take 1 tablet (100 mg total) by mouth 2 (two) times daily.  60 tablet  12   No facility-administered medications prior to visit.    PAST MEDICAL HISTORY: Past Medical History  Diagnosis Date  . Hyperlipidemia   . IHD (ischemic heart disease)   . PAOD (peripheral arterial occlusive disease)   . Carotid bruit   . Diabetes mellitus   . Hypertension   . PVD (peripheral vascular disease)   . Coronary artery disease   . Dizziness   . Persistent headaches   . Pneumonia Jan. 2014  . Bronchitis Jan 2014    became pneumonia    PAST SURGICAL HISTORY: Past Surgical History  Procedure Laterality Date  . Cardiac catheterization  04/28/2001  . 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 US echocardiography  07/16/2004    EF 55-60%  . Cardiovascular stress test  11/23/2006    EF 64%  . Carotid-subclavian bypass graft  2003    FAMILY HISTORY: Family History  Problem Relation Age of Onset  . Cancer Mother   . Cancer Father     SOCIAL HISTORY:  History   Social History  . Marital Status: Single    Spouse Name: [name]    Number of Children: 0  . Years of Education: 12   Occupational History  .      Disabled   Social History Main Topics   . Smoking status: Current Every Day Smoker -- 0.50 packs/day    Types: Cigarettes  . Smokeless tobacco: Never Used  . Alcohol Use: No  . Drug Use: No  . Sexual Activity: Not on file   Other Topics Concern  . Not on file   Social History Narrative   Patient lives at home with her husband [name]).   Disabled.   Right handed   Education 12 th grade   Caffeine- One cup of coffee daily.     PHYSICAL EXAM   Filed Vitals:   09/07/13 1124  BP: 133/75  Pulse: 74  Height: 5\' 4"  (1.626 m)  Weight: 139 lb (63.05 kg)    Not recorded    Body mass index is 23.85 kg/(m^2).   Generalized: In no acute distress  Neck: Supple, right carotid bruits   Cardiac: Regular rate rhythm  Pulmonary: Clear to auscultation bilaterally  Musculoskeletal: No deformity  Neurological examination  Mentation: Alert oriented to time, place, history taking, and causual conversation  Cranial nerve  II-XII: She has left ptosis, left pupil is 1mm smaller than right pupil, her pupils were equal round reactive to light extraocular movements were full, Visual field were full on confrontational test. Bilateral fundi were sharp.  Facial  strength were normal. Hearing was intact to finger rubbing bilaterally. Uvula tongue midline.  head turning and shoulder shrug and were normal and symmetric.Tongue protrusion into cheek strength was normal.  Motor: normal tone, bulk and strength.  Sensory: Intact to fine touch, pinprick, preserved vibratory sensation, and proprioception at toes.  Coordination: Normal finger to nose, heel-to-shin bilaterally there was no truncal ataxia  Gait: Rising up from seated position without assistance, normal stance, without trunk ataxia, moderate stride, good arm swing, smooth turning, able to perform tiptoe, and heel walking without difficulty.   Romberg signs: Negative  Deep tendon reflexes: Brachioradialis 3/3, biceps 3/3, triceps3/3 Patellar 3/3, Achilles 2/2, plantar  responses were flexor bilaterally.   DIAGNOSTIC DATA (LABS, IMAGING, TESTING) - I reviewed patient records, labs, notes, testing and imaging myself where available.  Lab Results  Component Value Date   WBC 7.3 06/09/2013   HGB 13.6 06/09/2013   HCT 39.9 06/09/2013   MCV 89.5 06/09/2013   PLT 222 06/09/2013      Component Value Date/Time   NA 140 06/09/2013 0445   K 3.6 06/09/2013 0445   CL 101 06/09/2013 0445   CO2 24 06/09/2013 0445   GLUCOSE 126* 06/09/2013 0445   BUN 10 06/09/2013 0445   CREATININE 0.74 06/09/2013 0445   CALCIUM 9.4 06/09/2013 0445   PROT 7.5 06/08/2013 0740   ALBUMIN 3.9 06/08/2013 0740   AST 17 06/08/2013 0740   ALT 12 06/08/2013 0740   ALKPHOS 61 06/08/2013 0740   BILITOT 0.2* 06/08/2013 0740   GFRNONAA >90 06/09/2013 0445   GFRAA >90 06/09/2013 0445   Lab Results  Component Value Date   CHOL 164 05/18/2012   HDL 50.70 05/18/2012   LDLCALC 99 05/18/2012   TRIG 74.0 05/18/2012   CHOLHDL 3 05/18/2012   Lab Results  Component Value Date   HGBA1C 7.5* 05/18/2012   Lab Results  Component Value Date   VITAMINB12 313 06/08/2013   Lab Results  Component Value Date   TSH 0.504 06/08/2013   ASSESSMENT AND PLAN   58 years old Caucasian female, with multiple vascular risk factors, including smoking, hypertension, hyperlipidemia, coronary artery disease, peripheral vascular disease, presenting with frequent headaches, starting from right neck, spreading forward, she has tenderness around right nuchal area, most consistent with cervicogenic pain, I have suggested physical therapy, hot compression, Flexeril, will taper her off Topamax, she did have abnormal EEG in the past, will repeat EEG, tried Depakote 500 mg every night, however described near passing out episode is almost  consistent  Presyncope likely orthostatic hypotension   Return to clinic with Eber Jones in 6 months   Levert Feinstein, M.D. Ph.D.  [facility] [address], [address] [place],  [place] [address] [phone]

## 2013-09-07 NOTE — Patient Instructions (Signed)
You are taking topiramate 100 mg twice a day now  First week, 0/1 Second week 0/  half tablet Third week 0/0

## 2013-09-18 ENCOUNTER — Other Ambulatory Visit: Payer: Medicare Other | Admitting: Radiology

## 2013-09-18 ENCOUNTER — Ambulatory Visit: Payer: Medicare Other | Admitting: Nurse Practitioner

## 2013-09-20 ENCOUNTER — Encounter: Payer: Self-pay | Admitting: Family

## 2013-09-21 ENCOUNTER — Encounter: Payer: Self-pay | Admitting: Family

## 2013-09-21 ENCOUNTER — Ambulatory Visit (INDEPENDENT_AMBULATORY_CARE_PROVIDER_SITE_OTHER): Payer: Medicare Other | Admitting: Family

## 2013-09-21 ENCOUNTER — Ambulatory Visit (HOSPITAL_COMMUNITY)
Admission: RE | Admit: 2013-09-21 | Discharge: 2013-09-21 | Disposition: A | Discharge status: Home / Self Care | Payer: Medicare Other | Source: Ambulatory Visit | Attending: Family | Admitting: Family

## 2013-09-21 VITALS — BP 103/73 | HR 59 | Resp 16 | Ht 64.0 in | Wt 138.0 lb

## 2013-09-21 DIAGNOSIS — Z48812 Encounter for surgical aftercare following surgery on the circulatory system: Secondary | ICD-10-CM

## 2013-09-21 DIAGNOSIS — I6529 Occlusion and stenosis of unspecified carotid artery: Secondary | ICD-10-CM

## 2013-09-21 NOTE — Progress Notes (Signed)
Established Carotid Patient   History of Present Illness  Danielle Farrell is a 58 y.o. female patient of Dr. Hart RochesterLawson who is s/p left carotid subclavian anastomosis in 2003 for left arm claudication which remains asymptomatic. She also has no lower extremity claudication having undergone aortobifemoral bypass graft by Dr. Hart RochesterLawson in 2002. She had coronary artery bypass grafting in the past and denies any active cardiac symptoms. She returns today for carotid artery surveillance. Has had a stoke a long time ago for which she does not remember experiencing.  States she also has chronic left cerebellar strokes, she states her symptoms were due to migraine headaches with left eye sight abnormalities, denies hemiparesis. She denies symptoms associated with claudication, denies non healing wounds. She has a headache at this time, recurrent to headaches that she has, ache in right lower skull area, has photosensitivity.  Has Ehlers-Danlus syndrome. Pt. reports New Medical or Surgical History: c5-7 disc protrusion. Has had labile blood pressure that she states has stabilized with medication adjustment. Denies steal symptoms, but does have tingling in left fingers that she attributes to C-spine issues and possibly Topamax which she is being weaned off.   Pt Diabetic: Yes, states likely uncontrolled Pt smoker: smoker  (1/2 ppd x since age 58 yrs)  Pt meds include: Statin : Yes ASA: Yes Other anticoagulants/antiplatelets: no   Past Medical History  Diagnosis Date  . Hyperlipidemia   . IHD (ischemic heart disease)   . PAOD (peripheral arterial occlusive disease)   . Carotid bruit   . Diabetes mellitus   . Hypertension   . PVD (peripheral vascular disease)   . Coronary artery disease   . Dizziness   . Persistent headaches   . Pneumonia Jan. 2014  . Bronchitis Jan 2014    became pneumonia  . Stroke   . Headache     Social History History  Substance Use Topics  . Smoking status: Current  Every Day Smoker -- 0.50 packs/day    Types: Cigarettes  . Smokeless tobacco: Never Used  . Alcohol Use: No    Family History Family History  Problem Relation Age of Onset  . Cancer Mother   . Cancer Father     Surgical History Past Surgical History  Procedure Laterality Date  . Cardiac catheterization  04/28/2001  . 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 [place] echocardiography  07/16/2004    EF 55-60%  . Cardiovascular stress test  11/23/2006    EF 64%  . Carotid-subclavian bypass graft  2003    Allergies  Allergen Reactions  . Erythromycin Nausea And Vomiting    Throw up  . Lipitor [Atorvastatin Calcium]     Muscle cramps in legs    Current Outpatient Prescriptions  Medication Sig Dispense Refill  . aspirin 81 MG EC tablet Take 81 mg by mouth daily.        . cyclobenzaprine (FLEXERIL) 10 MG tablet Take 1 tablet (10 mg total) by mouth 3 (three) times daily as needed for muscle spasms.  60 tablet  12  . diclofenac (CATAFLAM) 50 MG tablet Take 50 mg by mouth as needed.      . divalproex (DEPAKOTE ER) 500 MG 24 hr tablet Take 1 tablet (500 mg total) by mouth daily.  30 tablet  12  . glipiZIDE (GLUCOTROL) 5 MG tablet Take 2 tablets (10 mg total) by mouth 2 (two) times daily before a meal.  360 tablet  1  . lisinopril (PRINIVIL,ZESTRIL) 10 MG tablet  Take 1 tablet (10 mg total) by mouth 2 (two) times daily.  180 tablet  3  . metFORMIN (GLUCOPHAGE) 1000 MG tablet Take 1,000 mg by mouth 2 (two) times daily with a meal.      . metoprolol (LOPRESSOR) 50 MG tablet 1/2 TWICE A DAY  90 tablet  3  . ondansetron (ZOFRAN) 4 MG tablet Take 1 tablet (4 mg total) by mouth every 8 (eight) hours as needed for nausea or vomiting.  30 tablet  3  . oxyCODONE-acetaminophen (PERCOCET/ROXICET) 5-325 MG per tablet Take 1 tablet by mouth as needed.  30 tablet  0  . PARoxetine (PAXIL) 20 MG tablet Take 1 tablet (20 mg total) by mouth every morning.  90 tablet  3  . rosuvastatin (CRESTOR) 10 MG  tablet Take 1 tablet (10 mg total) by mouth daily after supper.  30 tablet  6  . topiramate (TOPAMAX) 100 MG tablet Take 1 tablet (100 mg total) by mouth 2 (two) times daily.  60 tablet  12   No current facility-administered medications for this visit.    Review of Systems : See HPI for pertinent positives and negatives.  Physical Examination  Filed Vitals:   09/21/13 1402  BP: 103/73  Pulse: 59  Resp:    Filed Weights   09/21/13 1401  Weight: 138 lb (62.596 kg)   Body mass index is 23.68 kg/(m^2).   General: WDWN female in NAD GAIT: normal Eyes: Left pupil smaller than the right, no other abnormalities noted other than left ptosis. Pulmonary:  Non-labored, CTAB with diminished air movement, Negative  Rales, Negative rhonchi, & Negative wheezing.  Cardiac: regular Rhythm ,  Negative detected murmur.  VASCULAR EXAM Carotid Bruits Left Right   Positive Negative    Aorta is not palpable. Radial pulses are 2+ palpable and equal.                                                                                                                            LE Pulses LEFT RIGHT       FEMORAL   palpable   palpable        POPLITEAL  not palpable   not palpable       POSTERIOR TIBIAL   palpable    palpable        DORSALIS PEDIS      ANTERIOR TIBIAL not palpable  not palpable     Gastrointestinal: soft, nontender, BS WNL, no r/g,  negative masses.  Musculoskeletal: Negative muscle atrophy/wasting. M/S 5/5 throughout, Extremities without ischemic changes  Neurologic: A&O X 3; Appropriate Affect ; SENSATION ;normal;  Speech is normal CN 2-12 intact except left pupil remains smaller than right , Pain and light touch intact in extremities, Motor exam as listed above.   Non-Invasive Vascular Imaging CAROTID DUPLEX 09/21/2013   CEREBROVASCULAR DUPLEX EVALUATION    INDICATION: Carotid disease    PREVIOUS INTERVENTION(S): Left subclavian artery transposition to the common  carotid artery on 08/16/01    DUPLEX EXAM:     RIGHT  LEFT  Peak Systolic Velocities (cm/s) End Diastolic Velocities (cm/s) Plaque LOCATION Peak Systolic Velocities (cm/s) End Diastolic Velocities (cm/s) Plaque  96 22  CCA PROXIMAL 113 22   83 28  CCA MID 108 31 HT  77 24 HT CCA DISTAL 87 26   103 15  ECA 82 17   186 53 HT ICA PROXIMAL 78 26 HT  71 29  ICA MID 78 29   65 23  ICA DISTAL 58 22     2.4 ICA / CCA Ratio (PSV) 0.9  Antegrade Vertebral Flow Antegrade  138 Brachial Systolic Pressure (mmHg) 120  Multiphasic (subclavian artery) Brachial Artery Waveforms Multiphasic (subclavian artery)    Plaque Morphology:  HM = Homogeneous, HT = Heterogeneous, CP = Calcific Plaque, SP = Smooth Plaque, IP = Irregular Plaque     ADDITIONAL FINDINGS:   No significant stenosis of the bilateral external or common carotid arteries.   No stenosis of the left subclavian artery transposition with a velocity of 138cm/s noted at the anastomosis.    IMPRESSION: 1. Doppler velocities suggest a 40-59% stenosis of the right proximal internal carotid artery and a less than 40% stenosis of the left proximal internal carotid artery. 2. Patent left subclavian artery transposition site with no hemodynamically significant stenosis.    Compared to the previous exam:  Velocities of the right proximal internal carotid artery appear less than previously recorded on the previous exam on 02/13/13 with the left carotid system remaining stable.       Assessment: Danielle Farrell is a 58 y.o. female  Who is s/p left carotid subclavian anastomosis in 2003 for left arm claudication which remains asymptomatic. She also has no lower extremity claudication having undergone aortobifemoral bypass graft by Dr. Hart Rochester in 2002.   No significant stenosis of the bilateral external or common carotid arteries.   No stenosis of the left subclavian artery transposition with a velocity of 138cm/s noted at the anastomosis. Doppler velocities  suggest a 40-59% stenosis of the right proximal internal carotid artery and a less than 40% stenosis of the left proximal internal carotid artery. Patent left subclavian artery transposition site with no hemodynamically significant stenosis. Velocities of the right proximal internal carotid artery appear less than previously recorded on the previous exam on 02/13/13 with the left carotid system remaining stable.  In her office note from earlier this month Dr. Terrace Arabia notes patient's left pupil is smaller than the right, which is the case now. She has a headache with photosensitivity.  Plan: She was counseled re smoking cessation. Follow-up in 1 year with Carotid Duplex scan and ABI's.   I discussed in depth with the patient the nature of atherosclerosis, and emphasized the importance of maximal medical management including strict control of blood pressure, blood glucose, and lipid levels, obtaining regular exercise, and cessation of smoking.  The patient is aware that without maximal medical management the underlying atherosclerotic disease process will progress, limiting the benefit of any interventions. The patient was given information about stroke prevention and what symptoms should prompt the patient to seek immediate medical care. Thank you for allowing [place] to participate in this patient's care.  Charisse March, RN, MSN, FNP-C [facility] Office: [phone]  Clinic Physician: Imogene Burn  09/21/2013 2:08 PM

## 2013-09-21 NOTE — Patient Instructions (Addendum)
Stroke Prevention Some medical conditions and behaviors are associated with an increased chance of having a stroke. You may prevent a stroke by making healthy choices and managing medical conditions. HOW CAN I REDUCE MY RISK OF HAVING A STROKE?   Stay physically active. Get at least 30 minutes of activity on most or all days.  Do not smoke. It may also be helpful to avoid exposure to secondhand smoke.  Limit alcohol use. Moderate alcohol use is considered to be:  No more than 2 drinks per day for men.  No more than 1 drink per day for nonpregnant women.  Eat healthy foods. This involves  Eating 5 or more servings of fruits and vegetables a day.  Following a diet that addresses high blood pressure (hypertension), high cholesterol, diabetes, or obesity.  Manage your cholesterol levels.  A diet low in saturated fat, trans fat, and cholesterol and high in fiber may control cholesterol levels.  Take any prescribed medicines to control cholesterol as directed by your health care provider.  Manage your diabetes.  A controlled-carbohydrate, controlled-sugar diet is recommended to manage diabetes.  Take any prescribed medicines to control diabetes as directed by your health care provider.  Control your hypertension.  A low-salt (sodium), low-saturated fat, low-trans fat, and low-cholesterol diet is recommended to manage hypertension.  Take any prescribed medicines to control hypertension as directed by your health care provider.  Maintain a healthy weight.  A reduced-calorie, low-sodium, low-saturated fat, low-trans fat, low-cholesterol diet is recommended to manage weight.  Stop drug abuse.  Avoid taking birth control pills.  Talk to your health care provider about the risks of taking birth control pills if you are over 35 years old, smoke, get migraines, or have ever had a blood clot.  Get evaluated for sleep disorders (sleep apnea).  Talk to your health care provider about  getting a sleep evaluation if you snore a lot or have excessive sleepiness.  Take medicines as directed by your health care provider.  For some people, aspirin or blood thinners (anticoagulants) are helpful in reducing the risk of forming abnormal blood clots that can lead to stroke. If you have the irregular heart rhythm of atrial fibrillation, you should be on a blood thinner unless there is a good reason you cannot take them.  Understand all your medicine instructions.  Make sure that other other conditions (such as anemia or atherosclerosis) are addressed. SEEK IMMEDIATE MEDICAL CARE IF:   You have sudden weakness or numbness of the face, arm, or leg, especially on one side of the body.  Your face or eyelid droops to one side.  You have sudden confusion.  You have trouble speaking (aphasia) or understanding.  You have sudden trouble seeing in one or both eyes.  You have sudden trouble walking.  You have dizziness.  You have a loss of balance or coordination.  You have a sudden, severe headache with no known cause.  You have new chest pain or an irregular heartbeat. Any of these symptoms may represent a serious problem that is an emergency. Do not wait to see if the symptoms will go away. Get medical help at once. Call your local emergency services  (911 in [place] Do not drive yourself to the hospital. Document Released: 07/29/2004 Document Revised: 04/11/2013 Document Reviewed: 12/22/2012 [employer] Patient Information 2014 [employer].   Peripheral Vascular Disease Peripheral Vascular Disease (PVD), also called Peripheral Arterial Disease (PAD), is a circulation problem caused by cholesterol (atherosclerotic plaque) deposits in the   arteries. PVD commonly occurs in the lower extremities (legs) but it can occur in other areas of the body, such as your arms. The cholesterol buildup in the arteries reduces blood flow which can cause pain and other serious problems. The presence  of PVD can place a person at risk for Coronary Artery Disease (CAD).  CAUSES  Causes of PVD can be many. It is usually associated with more than one risk factor such as:   High Cholesterol.  Smoking.  Diabetes.  Lack of exercise or inactivity.  High blood pressure (hypertension).  Obesity.  Family history. SYMPTOMS   When the lower extremities are affected, patients with PVD may experience:  Leg pain with exertion or physical activity. This is called INTERMITTENT CLAUDICATION. This may present as cramping or numbness with physical activity. The location of the pain is associated with the level of blockage. For example, blockage at the abdominal level (distal abdominal aorta) may result in buttock or hip pain. Lower leg arterial blockage may result in calf pain.  As PVD becomes more severe, pain can develop with less physical activity.  In people with severe PVD, leg pain may occur at rest.  Other PVD signs and symptoms:  Leg numbness or weakness.  Coldness in the affected leg or foot, especially when compared to the other leg.  A change in leg color.  Patients with significant PVD are more prone to ulcers or sores on toes, feet or legs. These may take longer to heal or may reoccur. The ulcers or sores can become infected.  If signs and symptoms of PVD are ignored, gangrene may occur. This can result in the loss of toes or loss of an entire limb.  Not all leg pain is related to PVD. Other medical conditions can cause leg pain such as:  Blood clots (embolism) or Deep Vein Thrombosis.  Inflammation of the blood vessels (vasculitis).  Spinal stenosis. DIAGNOSIS  Diagnosis of PVD can involve several different types of tests. These can include:  Pulse Volume Recording Method (PVR). This test is simple, painless and does not involve the use of X-rays. PVR involves measuring and comparing the blood pressure in the arms and legs. An ABI (Ankle-Brachial Index) is calculated.  The normal ratio of blood pressures is 1. As this number becomes smaller, it indicates more severe disease.  < 0.95  indicates significant narrowing in one or more leg vessels.  <0.8 there will usually be pain in the foot, leg or buttock with exercise.  <0.4 will usually have pain in the legs at rest.  <0.25  usually indicates limb threatening PVD.  Doppler detection of pulses in the legs. This test is painless and checks to see if you have a pulses in your legs/feet.  A dye or contrast material (a substance that highlights the blood vessels so they show up on x-ray) may be given to help your caregiver better see the arteries for the following tests. The dye is eliminated from your body by the kidney's. Your caregiver may order blood work to check your kidney function and other laboratory values before the following tests are performed:  Magnetic Resonance Angiography (MRA). An MRA is a picture study of the blood vessels and arteries. The MRA machine uses a large magnet to produce images of the blood vessels.  Computed Tomography Angiography (CTA). A CTA is a specialized x-ray that looks at how the blood flows in your blood vessels. An IV may be inserted into your arm so contrast dye   can be injected.  Angiogram. Is a procedure that uses x-rays to look at your blood vessels. This procedure is minimally invasive, meaning a small incision (cut) is made in your groin. A small tube (catheter) is then inserted into the artery of your groin. The catheter is guided to the blood vessel or artery your caregiver wants to examine. Contrast dye is injected into the catheter. X-rays are then taken of the blood vessel or artery. After the images are obtained, the catheter is taken out. TREATMENT  Treatment of PVD involves many interventions which may include:  Lifestyle changes:  Quitting smoking.  Exercise.  Following a low fat, low cholesterol diet.  Control of diabetes.  Foot care is very  important to the PVD patient. Good foot care can help prevent infection.  Medication:  Cholesterol-lowering medicine.  Blood pressure medicine.  Anti-platelet drugs.  Certain medicines may reduce symptoms of Intermittent Claudication.  Interventional/Surgical options:  Angioplasty. An Angioplasty is a procedure that inflates a balloon in the blocked artery. This opens the blocked artery to improve blood flow.  Stent Implant. A wire mesh tube (stent) is placed in the artery. The stent expands and stays in place, allowing the artery to remain open.  Peripheral Bypass Surgery. This is a surgical procedure that reroutes the blood around a blocked artery to help improve blood flow. This type of procedure may be performed if Angioplasty or stent implants are not an option. SEEK IMMEDIATE MEDICAL CARE IF:   You develop pain or numbness in your arms or legs.  Your arm or leg turns cold, becomes blue in color.  You develop redness, warmth, swelling and pain in your arms or legs. MAKE SURE YOU:   Understand these instructions.  Will watch your condition.  Will get help right away if you are not doing well or get worse. Document Released: 07/29/2004 Document Revised: 09/13/2011 Document Reviewed: 06/25/2008 [employer] Patient Information 2014 [employer].   Smoking Cessation Quitting smoking is important to your health and has many advantages. However, it is not always easy to quit since nicotine is a very addictive drug. Often times, people try 3 times or more before being able to quit. This document explains the best ways for you to prepare to quit smoking. Quitting takes hard work and a lot of effort, but you can do it. ADVANTAGES OF QUITTING SMOKING  You will live longer, feel better, and live better.  Your body will feel the impact of quitting smoking almost immediately.  Within 20 minutes, blood pressure decreases. Your pulse returns to its normal level.  After 8 hours,  carbon monoxide levels in the blood return to normal. Your oxygen level increases.  After 24 hours, the chance of having a heart attack starts to decrease. Your breath, hair, and body stop smelling like smoke.  After 48 hours, damaged nerve endings begin to recover. Your sense of taste and smell improve.  After 72 hours, the body is virtually free of nicotine. Your bronchial tubes relax and breathing becomes easier.  After 2 to 12 weeks, lungs can hold more air. Exercise becomes easier and circulation improves.  The risk of having a heart attack, stroke, cancer, or lung disease is greatly reduced.  After 1 year, the risk of coronary heart disease is cut in half.  After 5 years, the risk of stroke falls to the same as a nonsmoker.  After 10 years, the risk of lung cancer is cut in half and the risk of other cancers   decreases significantly.  After 15 years, the risk of coronary heart disease drops, usually to the level of a nonsmoker.  If you are pregnant, quitting smoking will improve your chances of having a healthy baby.  The people you live with, especially any children, will be healthier.  You will have extra money to spend on things other than cigarettes. QUESTIONS TO THINK ABOUT BEFORE ATTEMPTING TO QUIT You may want to talk about your answers with your caregiver.  Why do you want to quit?  If you tried to quit in the past, what helped and what did not?  What will be the most difficult situations for you after you quit? How will you plan to handle them?  Who can help you through the tough times? Your family? Friends? A caregiver?  What pleasures do you get from smoking? What ways can you still get pleasure if you quit? Here are some questions to ask your caregiver:  How can you help me to be successful at quitting?  What medicine do you think would be best for me and how should I take it?  What should I do if I need more help?  What is smoking withdrawal like? How  can I get information on withdrawal? GET READY  Set a quit date.  Change your environment by getting rid of all cigarettes, ashtrays, matches, and lighters in your home, car, or work. Do not let people smoke in your home.  Review your past attempts to quit. Think about what worked and what did not. GET SUPPORT AND ENCOURAGEMENT You have a better chance of being successful if you have help. You can get support in many ways.  Tell your family, friends, and co-workers that you are going to quit and need their support. Ask them not to smoke around you.  Get individual, group, or telephone counseling and support. Programs are available at local hospitals and health centers. Call your local health department for information about programs in your area.  Spiritual beliefs and practices may help some smokers quit.  Download a "quit meter" on your computer to keep track of quit statistics, such as how long you have gone without smoking, cigarettes not smoked, and money saved.  Get a self-help book about quitting smoking and staying off of tobacco. LEARN NEW SKILLS AND BEHAVIORS  Distract yourself from urges to smoke. Talk to someone, go for a walk, or occupy your time with a task.  Change your normal routine. Take a different route to work. Drink tea instead of coffee. Eat breakfast in a different place.  Reduce your stress. Take a hot bath, exercise, or read a book.  Plan something enjoyable to do every day. Reward yourself for not smoking.  Explore interactive web-based programs that specialize in helping you quit. GET MEDICINE AND USE IT CORRECTLY Medicines can help you stop smoking and decrease the urge to smoke. Combining medicine with the above behavioral methods and support can greatly increase your chances of successfully quitting smoking.  Nicotine replacement therapy helps deliver nicotine to your body without the negative effects and risks of smoking. Nicotine replacement therapy  includes nicotine gum, lozenges, inhalers, nasal sprays, and skin patches. Some may be available over-the-counter and others require a prescription.  Antidepressant medicine helps people abstain from smoking, but how this works is unknown. This medicine is available by prescription.  Nicotinic receptor partial agonist medicine simulates the effect of nicotine in your brain. This medicine is available by prescription. Ask your caregiver for   advice about which medicines to use and how to use them based on your health history. Your caregiver will tell you what side effects to look out for if you choose to be on a medicine or therapy. Carefully read the information on the package. Do not use any other product containing nicotine while using a nicotine replacement product.  RELAPSE OR DIFFICULT SITUATIONS Most relapses occur within the first 3 months after quitting. Do not be discouraged if you start smoking again. Remember, most people try several times before finally quitting. You may have symptoms of withdrawal because your body is used to nicotine. You may crave cigarettes, be irritable, feel very hungry, cough often, get headaches, or have difficulty concentrating. The withdrawal symptoms are only temporary. They are strongest when you first quit, but they will go away within 10 14 days. To reduce the chances of relapse, try to:  Avoid drinking alcohol. Drinking lowers your chances of successfully quitting.  Reduce the amount of caffeine you consume. Once you quit smoking, the amount of caffeine in your body increases and can give you symptoms, such as a rapid heartbeat, sweating, and anxiety.  Avoid smokers because they can make you want to smoke.  Do not let weight gain distract you. Many smokers will gain weight when they quit, usually less than 10 pounds. Eat a healthy diet and stay active. You can always lose the weight gained after you quit.  Find ways to improve your mood other than  smoking. FOR MORE INFORMATION  www.smokefree.gov  Document Released: 06/15/2001 Document Revised: 12/21/2011 Document Reviewed: 09/30/2011 [employer] Patient Information 2014 [employer].  

## 2013-12-03 ENCOUNTER — Encounter: Payer: Self-pay | Admitting: Nurse Practitioner

## 2013-12-03 ENCOUNTER — Telehealth: Payer: Self-pay | Admitting: Nurse Practitioner

## 2013-12-03 NOTE — Telephone Encounter (Signed)
Left message for patient regarding rescheduling 03/13/14 appointment per Carolyn's schedule, printed and mailed letter with new appointment time.

## 2014-01-10 ENCOUNTER — Encounter (HOSPITAL_COMMUNITY): Payer: Self-pay | Admitting: Emergency Medicine

## 2014-01-10 DIAGNOSIS — G43909 Migraine, unspecified, not intractable, without status migrainosus: Secondary | ICD-10-CM | POA: Insufficient documentation

## 2014-01-10 DIAGNOSIS — I10 Essential (primary) hypertension: Secondary | ICD-10-CM | POA: Insufficient documentation

## 2014-01-10 DIAGNOSIS — E119 Type 2 diabetes mellitus without complications: Secondary | ICD-10-CM | POA: Insufficient documentation

## 2014-01-10 DIAGNOSIS — Z8701 Personal history of pneumonia (recurrent): Secondary | ICD-10-CM | POA: Insufficient documentation

## 2014-01-10 DIAGNOSIS — I259 Chronic ischemic heart disease, unspecified: Secondary | ICD-10-CM | POA: Insufficient documentation

## 2014-01-10 DIAGNOSIS — Z8673 Personal history of transient ischemic attack (TIA), and cerebral infarction without residual deficits: Secondary | ICD-10-CM | POA: Insufficient documentation

## 2014-01-10 DIAGNOSIS — Z951 Presence of aortocoronary bypass graft: Secondary | ICD-10-CM | POA: Insufficient documentation

## 2014-01-10 DIAGNOSIS — R112 Nausea with vomiting, unspecified: Secondary | ICD-10-CM | POA: Insufficient documentation

## 2014-01-10 DIAGNOSIS — Z8709 Personal history of other diseases of the respiratory system: Secondary | ICD-10-CM | POA: Insufficient documentation

## 2014-01-10 DIAGNOSIS — E785 Hyperlipidemia, unspecified: Secondary | ICD-10-CM | POA: Insufficient documentation

## 2014-01-10 DIAGNOSIS — Z79899 Other long term (current) drug therapy: Secondary | ICD-10-CM | POA: Insufficient documentation

## 2014-01-10 DIAGNOSIS — F17200 Nicotine dependence, unspecified, uncomplicated: Secondary | ICD-10-CM | POA: Insufficient documentation

## 2014-01-10 DIAGNOSIS — I739 Peripheral vascular disease, unspecified: Secondary | ICD-10-CM | POA: Insufficient documentation

## 2014-01-10 DIAGNOSIS — I2510 Atherosclerotic heart disease of native coronary artery without angina pectoris: Secondary | ICD-10-CM | POA: Insufficient documentation

## 2014-01-10 DIAGNOSIS — Z7982 Long term (current) use of aspirin: Secondary | ICD-10-CM | POA: Insufficient documentation

## 2014-01-10 DIAGNOSIS — Z9889 Other specified postprocedural states: Secondary | ICD-10-CM | POA: Insufficient documentation

## 2014-01-10 MED ORDER — FENTANYL CITRATE 0.05 MG/ML IJ SOLN
50.0000 ug | Freq: Once | INTRAMUSCULAR | Status: AC
  Administered 2014-01-10: 50 ug via INTRAVENOUS
  Filled 2014-01-10: qty 2

## 2014-01-10 NOTE — ED Notes (Signed)
Pt. reports migraine headache with emesis onset this afternoon .

## 2014-01-10 NOTE — ED Notes (Signed)
Fentanyl 50 mcg given intranasal spray .

## 2014-01-11 ENCOUNTER — Emergency Department (HOSPITAL_COMMUNITY)
Admission: EM | Admit: 2014-01-11 | Discharge: 2014-01-11 | Disposition: A | Discharge status: Home / Self Care | Payer: Medicare Other | Attending: Emergency Medicine | Admitting: Emergency Medicine

## 2014-01-11 DIAGNOSIS — R519 Headache, unspecified: Secondary | ICD-10-CM

## 2014-01-11 DIAGNOSIS — R51 Headache: Secondary | ICD-10-CM

## 2014-01-11 HISTORY — DX: Migraine, unspecified, not intractable, without status migrainosus: G43.909

## 2014-01-11 MED ORDER — DIPHENHYDRAMINE HCL 50 MG/ML IJ SOLN
25.0000 mg | Freq: Once | INTRAMUSCULAR | Status: AC
  Administered 2014-01-11: 25 mg via INTRAVENOUS
  Filled 2014-01-11: qty 1

## 2014-01-11 MED ORDER — SODIUM CHLORIDE 0.9 % IV BOLUS (SEPSIS)
1000.0000 mL | INTRAVENOUS | Status: AC
Start: 2014-01-11 — End: 2014-01-11
  Administered 2014-01-11: 1000 mL via INTRAVENOUS

## 2014-01-11 MED ORDER — DEXAMETHASONE SODIUM PHOSPHATE 10 MG/ML IJ SOLN
10.0000 mg | Freq: Once | INTRAMUSCULAR | Status: AC
  Administered 2014-01-11: 10 mg via INTRAVENOUS
  Filled 2014-01-11: qty 1

## 2014-01-11 MED ORDER — METOCLOPRAMIDE HCL 5 MG/ML IJ SOLN
10.0000 mg | Freq: Once | INTRAMUSCULAR | Status: AC
  Administered 2014-01-11: 10 mg via INTRAVENOUS
  Filled 2014-01-11: qty 2

## 2014-01-11 NOTE — ED Notes (Signed)
Pt A&Ox4, ambulatory at d/c with steady gait, NAD, declined wheelchair. 

## 2014-01-11 NOTE — ED Provider Notes (Signed)
CSN: [id number]     Arrival date & time 01/10/14  2128 History   First MD Initiated Contact with Patient 01/11/14 0346     Chief Complaint  Patient presents with  . Migraine     (Consider location/radiation/quality/duration/timing/severity/associated sxs/prior Treatment) Patient is a 58 y.o. female presenting with migraines. The history is provided by the patient.  Migraine This is a chronic problem. The current episode started 6 to 12 hours ago. The problem occurs constantly. The problem has been gradually worsening. Pertinent negatives include no chest pain, no abdominal pain, no headaches and no shortness of breath. Nothing aggravates the symptoms. Nothing relieves the symptoms. Treatments tried: asa, oxycodone. The treatment provided no relief.    Past Medical History  Diagnosis Date  . Hyperlipidemia   . IHD (ischemic heart disease)   . PAOD (peripheral arterial occlusive disease)   . Carotid bruit   . Diabetes mellitus   . Hypertension   . PVD (peripheral vascular disease)   . Coronary artery disease   . Dizziness   . Persistent headaches   . Pneumonia Jan. 2014  . Bronchitis Jan 2014    became pneumonia  . Stroke   . Headache   . Migraine headache    Past Surgical History  Procedure Laterality Date  . Cardiac catheterization  04/28/2001  . 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 US echocardiography  07/16/2004    EF 55-60%  . Cardiovascular stress test  11/23/2006    EF 64%  . Carotid-subclavian bypass graft  2003   Family History  Problem Relation Age of Onset  . Cancer Mother   . Cancer Father    History  Substance Use Topics  . Smoking status: Current Every Day Smoker -- 0.50 packs/day    Types: Cigarettes  . Smokeless tobacco: Never Used  . Alcohol Use: No   OB History   Grav Para Term Preterm Abortions TAB SAB Ect Mult Living                 Review of Systems  Constitutional: Negative for fever and fatigue.  HENT: Negative for congestion and  drooling.   Eyes: Negative for pain.  Respiratory: Negative for cough and shortness of breath.   Cardiovascular: Negative for chest pain.  Gastrointestinal: Positive for nausea and vomiting. Negative for abdominal pain and diarrhea.  Genitourinary: Negative for dysuria and hematuria.  Musculoskeletal: Negative for back pain, gait problem and neck pain.  Skin: Negative for color change.  Neurological: Negative for dizziness and headaches.  Hematological: Negative for adenopathy.  Psychiatric/Behavioral: Negative for behavioral problems.  All other systems reviewed and are negative.     Allergies  Erythromycin and Lipitor  Home Medications   Prior to Admission medications   Medication Sig Start Date End Date Taking? Authorizing Provider  aspirin 81 MG EC tablet Take 81 mg by mouth daily.     Yes Historical Provider, MD  glipiZIDE (GLUCOTROL) 5 MG tablet Take 2 tablets (10 mg total) by mouth 2 (two) times daily before a meal. 09/06/12  Yes Cassell Clement, MD  lisinopril (PRINIVIL,ZESTRIL) 10 MG tablet Take 1 tablet (10 mg total) by mouth 2 (two) times daily. 08/07/13  Yes Cassell Clement, MD  metFORMIN (GLUCOPHAGE) 1000 MG tablet Take 1,000 mg by mouth 2 (two) times daily with a meal.   Yes Historical Provider, MD  metoprolol (LOPRESSOR) 50 MG tablet 1/2 TWICE A DAY 08/07/13  Yes Cassell Clement, MD  ondansetron (ZOFRAN) 4 MG tablet  Take 1 tablet (4 mg total) by mouth every 8 (eight) hours as needed for nausea or vomiting. 06/15/13  Yes Levert Feinstein, MD  oxyCODONE-acetaminophen (PERCOCET/ROXICET) 5-325 MG per tablet Take 1 tablet by mouth every 4 (four) hours as needed for moderate pain or severe pain.   Yes Historical Provider, MD  PARoxetine (PAXIL) 20 MG tablet Take 1 tablet (20 mg total) by mouth every morning. 01/27/12  Yes Cassell Clement, MD  rosuvastatin (CRESTOR) 10 MG tablet Take 1 tablet (10 mg total) by mouth daily after supper. 07/04/13  Yes Cassell Clement, MD   BP 153/61   Pulse 60  Temp(Src) 97.5 F (36.4 C) (Oral)  Resp 20  SpO2 98% Physical Exam  Nursing note and vitals reviewed. Constitutional: She is oriented to person, place, and time. She appears well-developed and well-nourished.  HENT:  Head: Normocephalic.  Mouth/Throat: No oropharyngeal exudate.  Eyes: Conjunctivae and EOM are normal. Pupils are equal, round, and reactive to light.  Neck: Normal range of motion. Neck supple.  Cardiovascular: Normal rate, regular rhythm, normal heart sounds and intact distal pulses.  Exam reveals no gallop and no friction rub.   No murmur heard. Pulmonary/Chest: Effort normal and breath sounds normal. No respiratory distress. She has no wheezes.  Abdominal: Soft. Bowel sounds are normal. There is no tenderness. There is no rebound and no guarding.  Musculoskeletal: Normal range of motion. She exhibits no edema and no tenderness.  Neurological: She is alert and oriented to person, place, and time.  alert, oriented x3 speech: normal in context and clarity memory: intact grossly cranial nerves II-XII: intact motor strength: full proximally and distally no involuntary movements or tremors sensation: intact to light touch diffusely  cerebellar: finger-to-nose and heel-to-shin intact gait: normal forwards and backwards   Skin: Skin is warm and dry.  Psychiatric: She has a normal mood and affect. Her behavior is normal.    ED Course  Procedures (including critical care time) Labs Review Labs Reviewed - No data to display  Imaging Review No results found.   EKG Interpretation None      MDM   Final diagnoses:  Headache, unspecified headache type    3:59 AM 58 y.o. female with a history of migraines who presents with a migraine. The headache is similar to previous headaches she's had in the past and began gradually this afternoon. It is currently 10 out of 10. She notes that she has had several episodes of vomiting which she has also had with  previous migraines. She is afebrile and vital signs are unremarkable here. She denies any fevers or head trauma. Will give a migraine cocktail.  6:42 AM: Pt feeling much better and would like to go home.  I have discussed the diagnosis/risks/treatment options with the patient and believe the pt to be eligible for discharge home to follow-up with pcp as needed. We also discussed returning to the ED immediately if new or worsening sx occur. We discussed the sx which are most concerning (e.g., worsening HA, fever) that necessitate immediate return. Medications administered to the patient during their visit and any new prescriptions provided to the patient are listed below.  Medications given during this visit Medications  fentaNYL (SUBLIMAZE) injection 50 mcg (50 mcg Intravenous Given 01/10/14 2145)  sodium chloride 0.9 % bolus 1,000 mL (0 mLs Intravenous Stopped 01/11/14 0532)  metoCLOPramide (REGLAN) injection 10 mg (10 mg Intravenous Given 01/11/14 0406)  diphenhydrAMINE (BENADRYL) injection 25 mg (25 mg Intravenous Given 01/11/14 0407)  dexamethasone (  DECADRON) injection 10 mg (10 mg Intravenous Given 01/11/14 0408)    New Prescriptions   No medications on file     Junius ArgyleForrest S Dinna Severs, MD 01/11/14 1952

## 2014-02-13 ENCOUNTER — Other Ambulatory Visit: Payer: Self-pay | Admitting: Cardiology

## 2014-03-05 ENCOUNTER — Other Ambulatory Visit: Payer: Self-pay | Admitting: Neurology

## 2014-03-05 ENCOUNTER — Ambulatory Visit (INDEPENDENT_AMBULATORY_CARE_PROVIDER_SITE_OTHER): Payer: Medicare Other | Admitting: Radiology

## 2014-03-05 DIAGNOSIS — M542 Cervicalgia: Secondary | ICD-10-CM

## 2014-03-05 DIAGNOSIS — R51 Headache: Secondary | ICD-10-CM

## 2014-03-05 MED ORDER — OXYCODONE-ACETAMINOPHEN 5-325 MG PO TABS: 1.0000 | ORAL_TABLET | Freq: Every day | ORAL | Status: DC

## 2014-03-05 NOTE — Telephone Encounter (Signed)
Needs RX for oxyCODONE-acetaminophen (PERCOCET/ROXICET) 5-325 MG per tablet please call when it is ready

## 2014-03-06 NOTE — Procedures (Signed)
   HISTORY:  58 years old female, with past medical history of stroke, presenting with new onset headache, sometimes with associated confusion episode  TECHNIQUE:  16 channel EEG was performed based on standard 10-16 international system. One channel was dedicated to EKG, which has demonstrates normal sinus rhythm  Upon awakening, the posterior background activity was in mixed small amplitude, alpha and beta range activity, reactive to eye opening and closure. There was frequent muscle artifact, bilateral frontal activity was mild slowing, higher amplitude theta range activity, there was occasionally slower higher amplitude activities, 4-5 Hz involving right hemisphere, T4, T6, P4 leads  There was occasionally sharp transients mainly involving T4, P4, T6 leads.  Photic stimulation was performed, which induced a symmetric photic driving.  Hyperventilation was not performed.  Patient was drowsy during recording, but no deeper stage of sleep was achieved.  CONCLUSION: This is a mild normal EEG.  There is evidence of intermittent right hemisphere slowing, also evidence of sharp transient involving T4, P4, T6, indicating focal readability.

## 2014-03-08 NOTE — Telephone Encounter (Signed)
Called pt and left message informing pt that her Rx was ready to be picked up at the front desk and if she has any other problems, questions or concerns to call the office. °

## 2014-03-13 ENCOUNTER — Ambulatory Visit: Payer: Medicare Other | Admitting: Nurse Practitioner

## 2014-03-26 ENCOUNTER — Ambulatory Visit: Payer: Medicare Other | Admitting: Nurse Practitioner

## 2014-05-24 ENCOUNTER — Telehealth: Payer: Self-pay | Admitting: Neurology

## 2014-05-24 NOTE — Telephone Encounter (Signed)
Left message for patient regarding rescheduling 06/19/14 appointment per Carolyn's schedule, rescheduled to next available on 07/10/14.  °

## 2014-06-06 ENCOUNTER — Encounter: Payer: Self-pay | Admitting: Nurse Practitioner

## 2014-06-19 ENCOUNTER — Ambulatory Visit: Payer: Medicare Other | Admitting: Nurse Practitioner

## 2014-07-10 ENCOUNTER — Ambulatory Visit: Payer: Medicare Other | Admitting: Nurse Practitioner

## 2014-08-07 ENCOUNTER — Ambulatory Visit (INDEPENDENT_AMBULATORY_CARE_PROVIDER_SITE_OTHER): Payer: BLUE CROSS/BLUE SHIELD | Admitting: Nurse Practitioner

## 2014-08-07 ENCOUNTER — Encounter: Payer: Self-pay | Admitting: Nurse Practitioner

## 2014-08-07 VITALS — BP 110/69 | HR 70 | Ht 64.0 in | Wt 135.0 lb

## 2014-08-07 DIAGNOSIS — E7800 Pure hypercholesterolemia, unspecified: Secondary | ICD-10-CM

## 2014-08-07 DIAGNOSIS — E1165 Type 2 diabetes mellitus with hyperglycemia: Secondary | ICD-10-CM | POA: Diagnosis not present

## 2014-08-07 DIAGNOSIS — I6523 Occlusion and stenosis of bilateral carotid arteries: Secondary | ICD-10-CM | POA: Diagnosis not present

## 2014-08-07 DIAGNOSIS — R51 Headache: Secondary | ICD-10-CM

## 2014-08-07 DIAGNOSIS — M542 Cervicalgia: Secondary | ICD-10-CM

## 2014-08-07 DIAGNOSIS — R519 Headache, unspecified: Secondary | ICD-10-CM

## 2014-08-07 MED ORDER — OXYCODONE-ACETAMINOPHEN 5-325 MG PO TABS: 1.0000 | ORAL_TABLET | Freq: Every day | ORAL | Status: DC

## 2014-08-07 NOTE — Progress Notes (Signed)
[facility]  PATIENT: Danielle Farrell DOB: May 18, 1956   REASON FOR VISIT: Follow-up for headache, neck pain HISTORY FROM:patient    HISTORY OF PRESENT ILLNESS: She has past medical history of hypertension, diabetes, hyperlipidemia, coronary artery disease, history of cardiac bypass surgery, subclavian artery bypass, transfemoral artery bypass, left renal artery bypass surgery,  She denied previous history of headaches, presenting with 2 severe episodes of headache since August 2014, both episode of headaches are similar, most severe one was in December 5th 2014, she finished dinner with her husband, 30 minutes later, she complains of gradual onset, rapid building up of a right retro-orbital headache, she took a couple tablets of aspirin, went to sleep, shortly afterwards, she got up, husband noticed she was confused, complaining of worsening headaches, nauseous vomitting, 4 hours later, she has become more confused combative, she was taken to the local hospital, her headache relieved by Dilaudid, but she remained confused until 48 hours later,  During her hospital stay, she had MRI of the brain, I have reviewed the film, there was chronic small vessel disease, chronic left cerebellar strokes, MRA of the brain showed intracranial atherosclerosis disease, no large vessel disease, MRA of neck showed moderate right internal carotid artery stenosis, 60%, patent left internal carotid artery, a proximal 30% proximal right vertebral artery stenosis, patent vertebral artery bilaterally otherwise   In between those 2 severe headaches, she denies significant visual change, no lateralized motor or sensory deficit,  She suffered left ptosis since her left subclavian artery bypass surgery,  She suffered her third similar episode again in January 13th 2015, right retrorbital area severe headaches, with associated confusion, MRA of brain, there is intracranial atherosclerotic disease, MRA  of the neck showed mild to moderate right internal carotid artery stenosis, but still patent,  She also complains of frequent right-sided neck pain, radiating pain to her right occipital region mild unsteady gait,  UPDATE 08/07/14: Danielle Farrell,  59 year old female returns for follow-up. She was last seen in this office by Dr. Lockie Pares 09/07/2013.  At that time she was complaining of neck pain which then would result in a headache in the occipital area spreading forward associated with light sensitivity.MRI scan of the cervical spine showing 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 She was placed on Flexeril but never got the medication filled. She is currently taking some oxycodone for headache neck pain. She could not tolerate Topamax, complains decreased appetite, alternation of taste, bilateral hands and feet paresthesia, She also reported episode of lightheadedness, near fainting when she first got up from seated position, sometimes her headache is so severe, is associated with visual distortion, zigzag light in her visual field. EEG done 03/06/2014. This is a mild normal EEG. There is evidence of intermittent right hemisphere slowing, also evidence of sharp transient involving T4, P4, T6, indicating focal readability.  She has significant coronary disease peripheral vascular disease and diabetes with most recent A1c 8.9. She returns for reevaluation   REVIEW OF SYSTEMS: Full 14 system review of systems performed and notable only for those listed, all others are neg:  Constitutional: neg  Cardiovascular: neg Ear/Nose/Throat: neg  Skin: neg Eyes: neg Respiratory: neg Gastroitestinal: neg  Hematology/Lymphatic: neg  Endocrine: neg Musculoskeletal:neg Allergy/Immunology: neg Neurological:  Headache , syncopal episodes previous stroke Psychiatric: neg Sleep :  Restless  leg   ALLERGIES: Allergies  Allergen Reactions  .  Erythromycin Nausea And Vomiting and Swelling    Throw up  . Lipitor [Atorvastatin Calcium]     Muscle cramps in legs    HOME MEDICATIONS: Outpatient Prescriptions Prior to Visit  Medication Sig Dispense Refill  . aspirin 81 MG EC tablet Take 81 mg by mouth daily.      . CRESTOR 10 MG tablet TAKE ONE TABLET BY MOUTH ONCE DAILY AFTER  SUPPER 30 tablet 0  . glipiZIDE (GLUCOTROL) 5 MG tablet Take 2 tablets (10 mg total) by mouth 2 (two) times daily before a meal. 360 tablet 1  . lisinopril (PRINIVIL,ZESTRIL) 10 MG tablet Take 1 tablet (10 mg total) by mouth 2 (two) times daily. 180 tablet 3  . metFORMIN (GLUCOPHAGE) 1000 MG tablet Take 1,000 mg by mouth. Take two daily by mouth and 1/2 at night. Increased.    . metoprolol (LOPRESSOR) 50 MG tablet 1/2 TWICE A DAY 90 tablet 3  . ondansetron (ZOFRAN) 4 MG tablet Take 1 tablet (4 mg total) by mouth every 8 (eight) hours as needed for nausea or vomiting. 30 tablet 3  . oxyCODONE-acetaminophen (PERCOCET/ROXICET) 5-325 MG per tablet Take 1 tablet by mouth daily. Only if needed 30 tablet 0  . PARoxetine (PAXIL) 20 MG tablet Take 1 tablet (20 mg total) by mouth every morning. 90 tablet 3   No facility-administered medications prior to visit.    PAST MEDICAL HISTORY: Past Medical History  Diagnosis Date  . Hyperlipidemia   . IHD (ischemic heart disease)   . PAOD (peripheral arterial occlusive disease)   . Carotid bruit   . Diabetes mellitus   . Hypertension   . PVD (peripheral vascular disease)   . Coronary artery disease   . Dizziness   . Persistent headaches   . Pneumonia Jan. 2014  . Bronchitis Jan 2014    became pneumonia  . Stroke   . Headache   . Migraine headache     PAST SURGICAL HISTORY: Past Surgical History  Procedure Laterality Date  . Cardiac catheterization  04/28/2001  . 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 US echocardiography  07/16/2004    EF 55-60%  .  Cardiovascular stress test  11/23/2006    EF 64%  . Carotid-subclavian bypass graft  2003    FAMILY HISTORY: Family History  Problem Relation Age of Onset  . Cancer Mother   . Cancer Father     SOCIAL HISTORY: History   Social History  . Marital Status: Single    Spouse Name: [name]    Number of Children: 0  . Years of Education: 12   Occupational History  .      Disabled   Social History Main Topics  . Smoking status: Current Every Day Smoker -- 0.50 packs/day    Types: Cigarettes  . Smokeless tobacco: Never Used  . Alcohol Use: No  . Drug Use: No  . Sexual Activity: Not on file   Other Topics Concern  . Not on file   Social History Narrative   Patient lives at home with her husband [name]).   Disabled.   Right handed   Education 12 th grade   Caffeine- One cup of coffee daily.     PHYSICAL EXAM  Filed Vitals:   08/07/14 0854  BP: 110/69  Pulse: 70  Height: 5\' 4"  (1.626 m)  Weight: 135 lb (61.236 kg)   Body mass index is 23.16 kg/(m^2).  Generalized: Well developed, in no acute distress  Head: normocephalic and  atraumatic,. Oropharynx benign  Neck: Supple, soft right carotid bruit  Cardiac: Regular rate rhythm, no murmur  Musculoskeletal: No deformity   Neurological examination   Mentation: Alert oriented to time, place, history taking. Attention span and concentration appropriate. Recent and remote memory intact.  Follows all commands speech and language fluent.   Cranial nerve II-XII: Fundoscopic exam reveals sharp disc margins. Left pupil 1 mm smaller than the right both react. Left ptosis noted.Facial sensation and strength were normal. hearing was intact to finger rubbing bilaterally. Uvula tongue midline. head turning and shoulder shrug were normal and symmetric.Tongue protrusion into cheek strength was normal. Motor: normal bulk and tone, full strength in the BUE, BLE, fine finger movements normal, no pronator drift. No focal  weakness Sensory: normal and symmetric to light touch, pinprick, and  Vibration, proprioception  Coordination: finger-nose-finger, heel-to-shin bilaterally, no dysmetria Reflexes: Brachioradialis 2/2, biceps 2/2, triceps 2/2, patellar 2/2, Achilles 2/2, plantar responses were flexor bilaterally. Gait and Station: Rising up from seated position without assistance, normal stance,  moderate stride, good arm swing, smooth turning, able to perform tiptoe, and heel walking without difficulty. Tandem gait is steady.  No assistive device  DIAGNOSTIC DATA (LABS, IMAGING, TESTING) - I reviewed patient records, labs, notes, testing and imaging myself where available.    ASSESSMENT AND PLAN  59 y.o. year old female  has a past medical history of Hyperlipidemia; IHD (ischemic heart disease); PAOD (peripheral arterial occlusive disease); Carotid bruit; Diabetes mellitus; Hypertension; PVD (peripheral vascular disease); Coronary artery disease; Dizziness; Persistent headaches; Stroke; Headache; and Migraine headache. here  To follow-up. She also has had complaints of neck pain.  MRI scan of the cervical spine showing 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   Flexeril 10mg  tab 1/2 at bedtime for 1 week then  Flexeril 1 tab every  night  Hemoglobin A1c needs to be less than 6.5 with your coronary disease   Continue aspirin for secondary stroke prevention  Keep cholesterol less than 200 and LDL less than 70 Carotid doppler is followed yearly by [facility] Rx for  Percocet given , patient made aware to use only if absolutely necessary , this is an addictive drug. Follow-up in 6-8 months Nilda Riggs, [facility], [facility], APRN  [facility] [address], [address] [place], [place] [address] [phone]

## 2014-08-07 NOTE — Patient Instructions (Signed)
Flexeril 10mg  tab 1/2 at bedtime for 1 week then  Flexeril 1 tab every  night   hemoglobin A1c needs to be less than 6.5 with your coronary disease    Follow-up in 6-8 months

## 2014-08-07 NOTE — Progress Notes (Signed)
I have reviewed and agreed above plan. 

## 2014-08-08 ENCOUNTER — Telehealth: Payer: Self-pay | Admitting: Nurse Practitioner

## 2014-08-08 MED ORDER — CYCLOBENZAPRINE HCL 10 MG PO TABS: ORAL_TABLET | ORAL | Status: DC

## 2014-08-08 NOTE — Telephone Encounter (Signed)
Pt is calling stating that the Rx for Flexeril 10mg  tab 1/2 at bedtime for 1 week then Flexeril 1 tab every night  was not sent to the correct pharmacy.  [facility] [place].  If you have any questions please call.

## 2014-08-08 NOTE — Telephone Encounter (Signed)
Rx has been sent  

## 2014-09-27 ENCOUNTER — Ambulatory Visit: Payer: Medicare Other | Admitting: Family

## 2014-09-27 ENCOUNTER — Other Ambulatory Visit (HOSPITAL_COMMUNITY): Payer: Medicare Other

## 2014-09-27 ENCOUNTER — Encounter (HOSPITAL_COMMUNITY): Payer: Medicare Other

## 2014-10-22 ENCOUNTER — Encounter: Payer: Self-pay | Admitting: Family

## 2014-10-23 ENCOUNTER — Encounter: Payer: Self-pay | Admitting: Family

## 2014-10-23 ENCOUNTER — Ambulatory Visit (HOSPITAL_COMMUNITY)
Admission: RE | Admit: 2014-10-23 | Discharge: 2014-10-23 | Disposition: A | Discharge status: Home / Self Care | Payer: Medicare Other | Source: Ambulatory Visit | Attending: Family | Admitting: Family

## 2014-10-23 ENCOUNTER — Ambulatory Visit (INDEPENDENT_AMBULATORY_CARE_PROVIDER_SITE_OTHER): Payer: Medicare Other | Admitting: Family

## 2014-10-23 ENCOUNTER — Ambulatory Visit (INDEPENDENT_AMBULATORY_CARE_PROVIDER_SITE_OTHER)
Admission: RE | Admit: 2014-10-23 | Discharge: 2014-10-23 | Disposition: A | Discharge status: Home / Self Care | Payer: Medicare Other | Source: Ambulatory Visit | Attending: Family | Admitting: Family

## 2014-10-23 ENCOUNTER — Other Ambulatory Visit: Payer: Self-pay | Admitting: Family

## 2014-10-23 VITALS — BP 102/67 | HR 62 | Resp 16 | Ht 65.0 in | Wt 134.0 lb

## 2014-10-23 DIAGNOSIS — Z95828 Presence of other vascular implants and grafts: Secondary | ICD-10-CM

## 2014-10-23 DIAGNOSIS — E785 Hyperlipidemia, unspecified: Secondary | ICD-10-CM | POA: Diagnosis not present

## 2014-10-23 DIAGNOSIS — I6523 Occlusion and stenosis of bilateral carotid arteries: Secondary | ICD-10-CM | POA: Diagnosis not present

## 2014-10-23 DIAGNOSIS — E119 Type 2 diabetes mellitus without complications: Secondary | ICD-10-CM | POA: Diagnosis not present

## 2014-10-23 DIAGNOSIS — Z48812 Encounter for surgical aftercare following surgery on the circulatory system: Secondary | ICD-10-CM

## 2014-10-23 DIAGNOSIS — I739 Peripheral vascular disease, unspecified: Secondary | ICD-10-CM

## 2014-10-23 DIAGNOSIS — I10 Essential (primary) hypertension: Secondary | ICD-10-CM | POA: Diagnosis not present

## 2014-10-23 DIAGNOSIS — Z9889 Other specified postprocedural states: Secondary | ICD-10-CM | POA: Diagnosis not present

## 2014-10-23 DIAGNOSIS — F17200 Nicotine dependence, unspecified, uncomplicated: Secondary | ICD-10-CM

## 2014-10-23 DIAGNOSIS — Z720 Tobacco use: Secondary | ICD-10-CM | POA: Diagnosis not present

## 2014-10-23 NOTE — Progress Notes (Signed)
[facility] [place] HISTORY AND PHYSICAL   MRN : [id number]  History of Present Illness:   Danielle Farrell is a 59 y.o. female patient of Dr. Hart RochesterLawson who is s/p left carotid subclavian anastomosis in 2003 for left arm claudication which remains asymptomatic. She also has no lower extremity claudication having undergone aortobifemoral bypass graft by Dr. Hart RochesterLawson in 2002. She had coronary artery bypass grafting in the past and denies any active cardiac symptoms. She returns today for yearly follow up. Has had a stoke a long time ago for which she does not remember experiencing.  States she also has chronic left cerebellar strokes, she states her symptoms were due to migraine headaches with left eye sight abnormalities, denies hemiparesis.  She denies symptoms associated with claudication, denies non healing wounds.  She has recurrent migraine headaches, has photosensitivity.  In her office note from March 2015 Dr. Terrace ArabiaYan notes patient's left pupil is smaller than the right, which is the case now. Patient reports C5-7 disc protrusion.  Has Ehlers-Danlus syndrome (excessive ROM of her joints).  Has had labile blood pressure that she states has stabilized with medication adjustment. Denies steal symptoms, but does have tingling in left fingers that she attributes to C-spine issues and possibly Topamax which she is being weaned off.   Pt Diabetic: Yes, states last A1C was 7.5 Pt smoker: smoker (1/3 ppd x since age 59 yrs)  Pt meds include: Statin : Yes, Crestor dose increased per pt, myalgias in thighs then worsened ASA: Yes Other anticoagulants/antiplatelets: no    Current Outpatient Prescriptions  Medication Sig Dispense Refill  . aspirin 81 MG EC tablet Take 81 mg by mouth daily.      . CRESTOR 10 MG tablet TAKE ONE TABLET BY MOUTH ONCE DAILY AFTER  SUPPER (Patient not taking: Reported on 10/23/2014) 30 tablet 0  . cyclobenzaprine (FLEXERIL) 10 MG tablet 1/2 tab at hs  for 1 week then increase to 1 tab every hs 30 tablet 3  . glipiZIDE (GLUCOTROL) 5 MG tablet Take 2 tablets (10 mg total) by mouth 2 (two) times daily before a meal. 360 tablet 1  . lisinopril (PRINIVIL,ZESTRIL) 10 MG tablet Take 1 tablet (10 mg total) by mouth 2 (two) times daily. 180 tablet 3  . metFORMIN (GLUCOPHAGE) 1000 MG tablet Take 1,000 mg by mouth. Take two daily by mouth and 1/2 at night. Increased.    . metoprolol (LOPRESSOR) 50 MG tablet 1/2 TWICE A DAY 90 tablet 3  . ondansetron (ZOFRAN) 4 MG tablet Take 1 tablet (4 mg total) by mouth every 8 (eight) hours as needed for nausea or vomiting. 30 tablet 3  . oxyCODONE-acetaminophen (PERCOCET/ROXICET) 5-325 MG per tablet Take 1 tablet by mouth daily. Only if needed 30 tablet 0  . PARoxetine (PAXIL) 20 MG tablet Take 1 tablet (20 mg total) by mouth every morning. 90 tablet 3   No current facility-administered medications for this visit.    Past Medical History  Diagnosis Date  . Hyperlipidemia   . IHD (ischemic heart disease)   . PAOD (peripheral arterial occlusive disease)   . Carotid bruit   . Diabetes mellitus   . Hypertension   . PVD (peripheral vascular disease)   . Coronary artery disease   . Dizziness   . Persistent headaches   . Pneumonia Jan. 2014  . Bronchitis Jan 2014    became pneumonia  . Stroke   . Headache   . Migraine headache  Social History History  Substance Use Topics  . Smoking status: Current Every Day Smoker -- 0.50 packs/day    Types: Cigarettes  . Smokeless tobacco: Never Used  . Alcohol Use: No    Family History Family History  Problem Relation Age of Onset  . Cancer Mother   . Cancer Father     Surgical History Past Surgical History  Procedure Laterality Date  . Cardiac catheterization  04/28/2001  . 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 US echocardiography  07/16/2004    EF 55-60%  . Cardiovascular stress test  11/23/2006    EF 64%  . Carotid-subclavian bypass graft   2003    Allergies  Allergen Reactions  . Erythromycin Nausea And Vomiting and Swelling    Throw up  . Lipitor [Atorvastatin Calcium]     Muscle cramps in legs    Current Outpatient Prescriptions  Medication Sig Dispense Refill  . aspirin 81 MG EC tablet Take 81 mg by mouth daily.      . CRESTOR 10 MG tablet TAKE ONE TABLET BY MOUTH ONCE DAILY AFTER  SUPPER (Patient not taking: Reported on 10/23/2014) 30 tablet 0  . cyclobenzaprine (FLEXERIL) 10 MG tablet 1/2 tab at hs for 1 week then increase to 1 tab every hs 30 tablet 3  . glipiZIDE (GLUCOTROL) 5 MG tablet Take 2 tablets (10 mg total) by mouth 2 (two) times daily before a meal. 360 tablet 1  . lisinopril (PRINIVIL,ZESTRIL) 10 MG tablet Take 1 tablet (10 mg total) by mouth 2 (two) times daily. 180 tablet 3  . metFORMIN (GLUCOPHAGE) 1000 MG tablet Take 1,000 mg by mouth. Take two daily by mouth and 1/2 at night. Increased.    . metoprolol (LOPRESSOR) 50 MG tablet 1/2 TWICE A DAY 90 tablet 3  . ondansetron (ZOFRAN) 4 MG tablet Take 1 tablet (4 mg total) by mouth every 8 (eight) hours as needed for nausea or vomiting. 30 tablet 3  . oxyCODONE-acetaminophen (PERCOCET/ROXICET) 5-325 MG per tablet Take 1 tablet by mouth daily. Only if needed 30 tablet 0  . PARoxetine (PAXIL) 20 MG tablet Take 1 tablet (20 mg total) by mouth every morning. 90 tablet 3   No current facility-administered medications for this visit.     REVIEW OF SYSTEMS: See HPI for pertinent positives and negatives.  Physical Examination Filed Vitals:   10/23/14 1012 10/23/14 1015  BP: 133/73 102/67  Pulse: 62 62  Resp:  16  Height:   (1.651 m)  Weight:  134 lb (60.782 kg)  SpO2:  98%   Body mass index is 22.3 kg/(m^2).  General: WDWN female in NAD GAIT: normal Eyes: Left pupil smaller than the right, no other abnormalities noted other than left ptosis. Pulmonary: Non-labored, CTAB with diminished air movement, Negative Rales, Negative rhonchi, & Negative  wheezing.  Cardiac: regular Rhythm, no detected murmur.  VASCULAR EXAM Carotid Bruits Left Right   Positive Negative   Aorta is not palpable. Radial pulses are 2+ palpable and equal.      LE Pulses LEFT RIGHT   FEMORAL  palpable  palpable    POPLITEAL not palpable  not palpable   POSTERIOR TIBIAL  palpable   palpable    DORSALIS PEDIS  ANTERIOR TIBIAL  palpable   palpable     Gastrointestinal: soft, nontender, BS WNL, no r/g, no palpable masses.  Musculoskeletal: Negative muscle atrophy/wasting. M/S 5/5 throughout, Extremities without ischemic changes  Neurologic: A&O X 3; Appropriate Affect,  Speech  is normal CN 2-12 intact except left pupil remains smaller than right , Pain and light touch intact in extremities, Motor exam as listed above.          Non-Invasive Vascular Imaging (10/23/2014):   CEREBROVASCULAR DUPLEX EVALUATION    INDICATION: Carotid stenosis    PREVIOUS INTERVENTION(S): Left carotid /subclavian transposition 08/16/2001    DUPLEX EXAM:     RIGHT  LEFT  Peak Systolic Velocities (cm/s) End Diastolic Velocities (cm/s) Plaque LOCATION Peak Systolic Velocities (cm/s) End Diastolic Velocities (cm/s) Plaque  91 19  CCA PROXIMAL 135 18   88 25  CCA MID 103 33 HT  66 24 HT CCA DISTAL 82 27 HT  119 15  ECA 91 22 CP  208 63 HT ICA PROXIMAL 77 28 CP  112 31  ICA MID 77 31   61 21  ICA DISTAL 83 36     2.4 ICA / CCA Ratio (PSV) 0.75  Antegrade Vertebral Flow Antegrade  142 Brachial Systolic Pressure (mmHg) 135  Triphasic Brachial Artery Waveforms Triphasic    Plaque Morphology:  HM = Homogeneous, HT = Heterogeneous, CP = Calcific Plaque, SP = Smooth Plaque, IP = Irregular Plaque  ADDITIONAL FINDINGS:     IMPRESSION: Right internal Carotid artery stenosis present in the  40%-59% range. Left internal carotid artery stenosis present in the less than 40% range, calcific plaque present making Doppler interrogation difficult and may be underestimating disease. Patent left subclavian/common carotid artery transposition.    Compared to the previous exam:  Essentially unchanged since study performed 09/21/2013.    ABI (Date: 10/23/2014)  R: 0.95, DP: triphasic, PT: triphasic, TBI: 0.73  L: 1.01, DP: triphasic, PT: triphasic, TBI: 0.95   ASSESSMENT:  Danielle Farrell is a 59 y.o. female who is s/p left carotid subclavian anastomosis in 2003 for left arm claudication which remains asymptomatic. She also has no lower extremity claudication having undergone aortobifemoral bypass graft by Dr. Hart Rochester in 2002. Today's carotid Duplex suggests  40%-59% right internal carotid artery stenosis and left internal carotid artery stenosis less than 40%. Patent left subclavian/common carotid artery transposition. Essentially unchanged since study performed 09/21/2013.  Bilateral ABI's are normal with all triphasic waveforms.   Her prominent atherosclerotic risk factors are uncontrolled DM (almost in control) and continued smoking.   PLAN:   The patient was counseled re smoking cessation and given several free resources re smoking cessation.  Pt advised to work closely with her PCP to get her DM under control.  Based on today's exam and non-invasive vascular lab results, the patient will follow up in 1 year with the following tests: carotid Duplex and ABI's. I discussed in depth with the patient the nature of atherosclerosis, and emphasized the importance of maximal medical management including strict control of blood pressure, blood glucose, and lipid levels, obtaining regular exercise, and cessation of smoking.  The patient is aware that without maximal medical management the underlying atherosclerotic disease process will progress, limiting the benefit of any  interventions.  The patient was given information about stroke prevention and what symptoms should prompt the patient to seek immediate medical care.  The patient was given information about PAD including signs, symptoms, treatment, what symptoms should prompt the patient to seek immediate medical care, and risk reduction measures to take. Thank you for allowing [place] to participate in this patient's care.  Charisse March, RN, MSN, FNP-C [facility] Office: [phone]  Clinic MD: Edilia Bo 10/23/2014 10:19 AM

## 2014-10-23 NOTE — Patient Instructions (Signed)
Stroke Prevention Some medical conditions and behaviors are associated with an increased chance of having a stroke. You may prevent a stroke by making healthy choices and managing medical conditions. HOW CAN I REDUCE MY RISK OF HAVING A STROKE?   Stay physically active. Get at least 30 minutes of activity on most or all days.  Do not smoke. It may also be helpful to avoid exposure to secondhand smoke.  Limit alcohol use. Moderate alcohol use is considered to be:  No more than 2 drinks per day for men.  No more than 1 drink per day for nonpregnant women.  Eat healthy foods. This involves:  Eating 5 or more servings of fruits and vegetables a day.  Making dietary changes that address high blood pressure (hypertension), high cholesterol, diabetes, or obesity.  Manage your cholesterol levels.  Making food choices that are high in fiber and low in saturated fat, trans fat, and cholesterol may control cholesterol levels.  Take any prescribed medicines to control cholesterol as directed by your health care provider.  Manage your diabetes.  Controlling your carbohydrate and sugar intake is recommended to manage diabetes.  Take any prescribed medicines to control diabetes as directed by your health care provider.  Control your hypertension.  Making food choices that are low in salt (sodium), saturated fat, trans fat, and cholesterol is recommended to manage hypertension.  Take any prescribed medicines to control hypertension as directed by your health care provider.  Maintain a healthy weight.  Reducing calorie intake and making food choices that are low in sodium, saturated fat, trans fat, and cholesterol are recommended to manage weight.  Stop drug abuse.  Avoid taking birth control pills.  Talk to your health care provider about the risks of taking birth control pills if you are over 35 years old, smoke, get migraines, or have ever had a blood clot.  Get evaluated for sleep  disorders (sleep apnea).  Talk to your health care provider about getting a sleep evaluation if you snore a lot or have excessive sleepiness.  Take medicines only as directed by your health care provider.  For some people, aspirin or blood thinners (anticoagulants) are helpful in reducing the risk of forming abnormal blood clots that can lead to stroke. If you have the irregular heart rhythm of atrial fibrillation, you should be on a blood thinner unless there is a good reason you cannot take them.  Understand all your medicine instructions.  Make sure that other conditions (such as anemia or atherosclerosis) are addressed. SEEK IMMEDIATE MEDICAL CARE IF:   You have sudden weakness or numbness of the face, arm, or leg, especially on one side of the body.  Your face or eyelid droops to one side.  You have sudden confusion.  You have trouble speaking (aphasia) or understanding.  You have sudden trouble seeing in one or both eyes.  You have sudden trouble walking.  You have dizziness.  You have a loss of balance or coordination.  You have a sudden, severe headache with no known cause.  You have new chest pain or an irregular heartbeat. Any of these symptoms may represent a serious problem that is an emergency. Do not wait to see if the symptoms will go away. Get medical help at once. Call your local emergency services (911 in [place] Do not drive yourself to the hospital. Document Released: 07/29/2004 Document Revised: 11/05/2013 Document Reviewed: 12/22/2012 [employer] Patient Information 2015 [employer]. This information is not intended to replace advice given   to you by your health care provider. Make sure you discuss any questions you have with your health care provider.     Peripheral Vascular Disease Peripheral Vascular Disease (PVD), also called Peripheral Arterial Disease (PAD), is a circulation problem caused by cholesterol (atherosclerotic plaque) deposits in the  arteries. PVD commonly occurs in the lower extremities (legs) but it can occur in other areas of the body, such as your arms. The cholesterol buildup in the arteries reduces blood flow which can cause pain and other serious problems. The presence of PVD can place a person at risk for Coronary Artery Disease (CAD).  CAUSES  Causes of PVD can be many. It is usually associated with more than one risk factor such as:   High Cholesterol.  Smoking.  Diabetes.  Lack of exercise or inactivity.  High blood pressure (hypertension).  Obesity.  Family history. SYMPTOMS   When the lower extremities are affected, patients with PVD may experience:  Leg pain with exertion or physical activity. This is called INTERMITTENT CLAUDICATION. This may present as cramping or numbness with physical activity. The location of the pain is associated with the level of blockage. For example, blockage at the abdominal level (distal abdominal aorta) may result in buttock or hip pain. Lower leg arterial blockage may result in calf pain.  As PVD becomes more severe, pain can develop with less physical activity.  In people with severe PVD, leg pain may occur at rest.  Other PVD signs and symptoms:  Leg numbness or weakness.  Coldness in the affected leg or foot, especially when compared to the other leg.  A change in leg color.  Patients with significant PVD are more prone to ulcers or sores on toes, feet or legs. These may take longer to heal or may reoccur. The ulcers or sores can become infected.  If signs and symptoms of PVD are ignored, gangrene may occur. This can result in the loss of toes or loss of an entire limb.  Not all leg pain is related to PVD. Other medical conditions can cause leg pain such as:  Blood clots (embolism) or Deep Vein Thrombosis.  Inflammation of the blood vessels (vasculitis).  Spinal stenosis. DIAGNOSIS  Diagnosis of PVD can involve several different types of tests. These  can include:  Pulse Volume Recording Method (PVR). This test is simple, painless and does not involve the use of X-rays. PVR involves measuring and comparing the blood pressure in the arms and legs. An ABI (Ankle-Brachial Index) is calculated. The normal ratio of blood pressures is 1. As this number becomes smaller, it indicates more severe disease.  < 0.95 - indicates significant narrowing in one or more leg vessels.  <0.8 - there will usually be pain in the foot, leg or buttock with exercise.  <0.4 - will usually have pain in the legs at rest.  <0.25 - usually indicates limb threatening PVD.  Doppler detection of pulses in the legs. This test is painless and checks to see if you have a pulses in your legs/feet.  A dye or contrast material (a substance that highlights the blood vessels so they show up on x-ray) may be given to help your caregiver better see the arteries for the following tests. The dye is eliminated from your body by the kidney's. Your caregiver may order blood work to check your kidney function and other laboratory values before the following tests are performed:  Magnetic Resonance Angiography (MRA). An MRA is a picture study of the   blood vessels and arteries. The MRA machine uses a large magnet to produce images of the blood vessels.  Computed Tomography Angiography (CTA). A CTA is a specialized x-ray that looks at how the blood flows in your blood vessels. An IV may be inserted into your arm so contrast dye can be injected.  Angiogram. Is a procedure that uses x-rays to look at your blood vessels. This procedure is minimally invasive, meaning a small incision (cut) is made in your groin. A small tube (catheter) is then inserted into the artery of your groin. The catheter is guided to the blood vessel or artery your caregiver wants to examine. Contrast dye is injected into the catheter. X-rays are then taken of the blood vessel or artery. After the images are obtained, the  catheter is taken out. TREATMENT  Treatment of PVD involves many interventions which may include:  Lifestyle changes:  Quitting smoking.  Exercise.  Following a low fat, low cholesterol diet.  Control of diabetes.  Foot care is very important to the PVD patient. Good foot care can help prevent infection.  Medication:  Cholesterol-lowering medicine.  Blood pressure medicine.  Anti-platelet drugs.  Certain medicines may reduce symptoms of Intermittent Claudication.  Interventional/Surgical options:  Angioplasty. An Angioplasty is a procedure that inflates a balloon in the blocked artery. This opens the blocked artery to improve blood flow.  Stent Implant. A wire mesh tube (stent) is placed in the artery. The stent expands and stays in place, allowing the artery to remain open.  Peripheral Bypass Surgery. This is a surgical procedure that reroutes the blood around a blocked artery to help improve blood flow. This type of procedure may be performed if Angioplasty or stent implants are not an option. SEEK IMMEDIATE MEDICAL CARE IF:   You develop pain or numbness in your arms or legs.  Your arm or leg turns cold, becomes blue in color.  You develop redness, warmth, swelling and pain in your arms or legs. MAKE SURE YOU:   Understand these instructions.  Will watch your condition.  Will get help right away if you are not doing well or get worse. Document Released: 07/29/2004 Document Revised: 09/13/2011 Document Reviewed: 06/25/2008 Exit[employer] Patient Information 2015 [employer]. This information is not intended to replace advice given to you by your health care provider. Make sure you discuss any questions you have with your health care provider.    Smoking Cessation Quitting smoking is important to your health and has many advantages. However, it is not always easy to quit since nicotine is a very addictive drug. Oftentimes, people try 3 times or more before being  able to quit. This document explains the best ways for you to prepare to quit smoking. Quitting takes hard work and a lot of effort, but you can do it. ADVANTAGES OF QUITTING SMOKING  You will live longer, feel better, and live better.  Your body will feel the impact of quitting smoking almost immediately.  Within 20 minutes, blood pressure decreases. Your pulse returns to its normal level.  After 8 hours, carbon monoxide levels in the blood return to normal. Your oxygen level increases.  After 24 hours, the chance of having a heart attack starts to decrease. Your breath, hair, and body stop smelling like smoke.  After 48 hours, damaged nerve endings begin to recover. Your sense of taste and smell improve.  After 72 hours, the body is virtually free of nicotine. Your bronchial tubes relax and breathing becomes easier.    After 2 to 12 weeks, lungs can hold more air. Exercise becomes easier and circulation improves.  The risk of having a heart attack, stroke, cancer, or lung disease is greatly reduced.  After 1 year, the risk of coronary heart disease is cut in half.  After 5 years, the risk of stroke falls to the same as a nonsmoker.  After 10 years, the risk of lung cancer is cut in half and the risk of other cancers decreases significantly.  After 15 years, the risk of coronary heart disease drops, usually to the level of a nonsmoker.  If you are pregnant, quitting smoking will improve your chances of having a healthy baby.  The people you live with, especially any children, will be healthier.  You will have extra money to spend on things other than cigarettes. QUESTIONS TO THINK ABOUT BEFORE ATTEMPTING TO QUIT You may want to talk about your answers with your health care provider.  Why do you want to quit?  If you tried to quit in the past, what helped and what did not?  What will be the most difficult situations for you after you quit? How will you plan to handle  them?  Who can help you through the tough times? Your family? Friends? A health care provider?  What pleasures do you get from smoking? What ways can you still get pleasure if you quit? Here are some questions to ask your health care provider:  How can you help me to be successful at quitting?  What medicine do you think would be best for me and how should I take it?  What should I do if I need more help?  What is smoking withdrawal like? How can I get information on withdrawal? GET READY  Set a quit date.  Change your environment by getting rid of all cigarettes, ashtrays, matches, and lighters in your home, car, or work. Do not let people smoke in your home.  Review your past attempts to quit. Think about what worked and what did not. GET SUPPORT AND ENCOURAGEMENT You have a better chance of being successful if you have help. You can get support in many ways.  Tell your family, friends, and coworkers that you are going to quit and need their support. Ask them not to smoke around you.  Get individual, group, or telephone counseling and support. Programs are available at local hospitals and health centers. Call your local health department for information about programs in your area.  Spiritual beliefs and practices may help some smokers quit.  Download a "quit meter" on your computer to keep track of quit statistics, such as how long you have gone without smoking, cigarettes not smoked, and money saved.  Get a self-help book about quitting smoking and staying off tobacco. LEARN NEW SKILLS AND BEHAVIORS  Distract yourself from urges to smoke. Talk to someone, go for a walk, or occupy your time with a task.  Change your normal routine. Take a different route to work. Drink tea instead of coffee. Eat breakfast in a different place.  Reduce your stress. Take a hot bath, exercise, or read a book.  Plan something enjoyable to do every day. Reward yourself for not  smoking.  Explore interactive web-based programs that specialize in helping you quit. GET MEDICINE AND USE IT CORRECTLY Medicines can help you stop smoking and decrease the urge to smoke. Combining medicine with the above behavioral methods and support can greatly increase your chances of successfully quitting smoking.    Nicotine replacement therapy helps deliver nicotine to your body without the negative effects and risks of smoking. Nicotine replacement therapy includes nicotine gum, lozenges, inhalers, nasal sprays, and skin patches. Some may be available over-the-counter and others require a prescription.  Antidepressant medicine helps people abstain from smoking, but how this works is unknown. This medicine is available by prescription.  Nicotinic receptor partial agonist medicine simulates the effect of nicotine in your brain. This medicine is available by prescription. Ask your health care provider for advice about which medicines to use and how to use them based on your health history. Your health care provider will tell you what side effects to look out for if you choose to be on a medicine or therapy. Carefully read the information on the package. Do not use any other product containing nicotine while using a nicotine replacement product.  RELAPSE OR DIFFICULT SITUATIONS Most relapses occur within the first 3 months after quitting. Do not be discouraged if you start smoking again. Remember, most people try several times before finally quitting. You may have symptoms of withdrawal because your body is used to nicotine. You may crave cigarettes, be irritable, feel very hungry, cough often, get headaches, or have difficulty concentrating. The withdrawal symptoms are only temporary. They are strongest when you first quit, but they will go away within 10-14 days. To reduce the chances of relapse, try to:  Avoid drinking alcohol. Drinking lowers your chances of successfully quitting.  Reduce the  amount of caffeine you consume. Once you quit smoking, the amount of caffeine in your body increases and can give you symptoms, such as a rapid heartbeat, sweating, and anxiety.  Avoid smokers because they can make you want to smoke.  Do not let weight gain distract you. Many smokers will gain weight when they quit, usually less than 10 pounds. Eat a healthy diet and stay active. You can always lose the weight gained after you quit.  Find ways to improve your mood other than smoking. FOR MORE INFORMATION  www.smokefree.gov  Document Released: 06/15/2001 Document Revised: 11/05/2013 Document Reviewed: 09/30/2011 [employer] Patient Information 2015 [employer]. This information is not intended to replace advice given to you by your health care provider. Make sure you discuss any questions you have with your health care provider.    Smoking Cessation, Tips for Success If you are ready to quit smoking, congratulations! You have chosen to help yourself be healthier. Cigarettes bring nicotine, tar, carbon monoxide, and other irritants into your body. Your lungs, heart, and blood vessels will be able to work better without these poisons. There are many different ways to quit smoking. Nicotine gum, nicotine patches, a nicotine inhaler, or nicotine nasal spray can help with physical craving. Hypnosis, support groups, and medicines help break the habit of smoking. WHAT THINGS CAN I DO TO MAKE QUITTING EASIER?  Here are some tips to help you quit for good:  Pick a date when you will quit smoking completely. Tell all of your friends and family about your plan to quit on that date.  Do not try to slowly cut down on the number of cigarettes you are smoking. Pick a quit date and quit smoking completely starting on that day.  Throw away all cigarettes.   Clean and remove all ashtrays from your home, work, and car.  On a card, write down your reasons for quitting. Carry the card with you and read it  when you get the urge to smoke.  Cleanse your body   of nicotine. Drink enough water and fluids to keep your urine clear or pale yellow. Do this after quitting to flush the nicotine from your body.  Learn to predict your moods. Do not let a bad situation be your excuse to have a cigarette. Some situations in your life might tempt you into wanting a cigarette.  Never have "just one" cigarette. It leads to wanting another and another. Remind yourself of your decision to quit.  Change habits associated with smoking. If you smoked while driving or when feeling stressed, try other activities to replace smoking. Stand up when drinking your coffee. Brush your teeth after eating. Sit in a different chair when you read the paper. Avoid alcohol while trying to quit, and try to drink fewer caffeinated beverages. Alcohol and caffeine may urge you to smoke.  Avoid foods and drinks that can trigger a desire to smoke, such as sugary or spicy foods and alcohol.  Ask people who smoke not to smoke around you.  Have something planned to do right after eating or having a cup of coffee. For example, plan to take a walk or exercise.  Try a relaxation exercise to calm you down and decrease your stress. Remember, you may be tense and nervous for the first 2 weeks after you quit, but this will pass.  Find new activities to keep your hands busy. Play with a pen, coin, or rubber band. Doodle or draw things on paper.  Brush your teeth right after eating. This will help cut down on the craving for the taste of tobacco after meals. You can also try mouthwash.   Use oral substitutes in place of cigarettes. Try using lemon drops, carrots, cinnamon sticks, or chewing gum. Keep them handy so they are available when you have the urge to smoke.  When you have the urge to smoke, try deep breathing.  Designate your home as a nonsmoking area.  If you are a heavy smoker, ask your health care provider about a prescription for  nicotine chewing gum. It can ease your withdrawal from nicotine.  Reward yourself. Set aside the cigarette money you save and buy yourself something nice.  Look for support from others. Join a support group or smoking cessation program. Ask someone at home or at work to help you with your plan to quit smoking.  Always ask yourself, "Do I need this cigarette or is this just a reflex?" Tell yourself, "Today, I choose not to smoke," or "I do not want to smoke." You are reminding yourself of your decision to quit.  Do not replace cigarette smoking with electronic cigarettes (commonly called e-cigarettes). The safety of e-cigarettes is unknown, and some may contain harmful chemicals.  If you relapse, do not give up! Plan ahead and think about what you will do the next time you get the urge to smoke. HOW WILL I FEEL WHEN I QUIT SMOKING? You may have symptoms of withdrawal because your body is used to nicotine (the addictive substance in cigarettes). You may crave cigarettes, be irritable, feel very hungry, cough often, get headaches, or have difficulty concentrating. The withdrawal symptoms are only temporary. They are strongest when you first quit but will go away within 10-14 days. When withdrawal symptoms occur, stay in control. Think about your reasons for quitting. Remind yourself that these are signs that your body is healing and getting used to being without cigarettes. Remember that withdrawal symptoms are easier to treat than the major diseases that smoking can cause.    Even after the withdrawal is over, expect periodic urges to smoke. However, these cravings are generally short lived and will go away whether you smoke or not. Do not smoke! WHAT RESOURCES ARE AVAILABLE TO HELP ME QUIT SMOKING? Your health care provider can direct you to community resources or hospitals for support, which may include:  Group support.  Education.  Hypnosis.  Therapy. Document Released: 03/19/2004 Document  Revised: 11/05/2013 Document Reviewed: 12/07/2012 [employer] Patient Information 2015 [employer]. This information is not intended to replace advice given to you by your health care provider. Make sure you discuss any questions you have with your health care provider.  

## 2014-10-29 NOTE — Addendum Note (Signed)
Addended by: Sharee PimpleMCCHESNEY, Deshara Rossi K on: 10/29/2014 03:03 PM   Modules accepted: Orders

## 2014-12-30 ENCOUNTER — Other Ambulatory Visit: Payer: Self-pay

## 2015-01-01 ENCOUNTER — Other Ambulatory Visit: Payer: Self-pay | Admitting: Neurology

## 2015-02-05 ENCOUNTER — Ambulatory Visit: Payer: BLUE CROSS/BLUE SHIELD | Admitting: Nurse Practitioner

## 2015-07-22 ENCOUNTER — Encounter: Payer: Self-pay | Admitting: Family

## 2015-07-29 ENCOUNTER — Encounter (HOSPITAL_COMMUNITY): Payer: Medicare Other

## 2015-07-29 ENCOUNTER — Ambulatory Visit: Payer: Medicare Other | Admitting: Family

## 2015-07-31 ENCOUNTER — Encounter (HOSPITAL_COMMUNITY): Payer: Self-pay | Admitting: Emergency Medicine

## 2015-07-31 ENCOUNTER — Emergency Department (HOSPITAL_COMMUNITY)
Admission: EM | Admit: 2015-07-31 | Discharge: 2015-08-01 | Disposition: A | Discharge status: Home / Self Care | Payer: Medicare Other | Attending: Emergency Medicine | Admitting: Emergency Medicine

## 2015-07-31 DIAGNOSIS — I2510 Atherosclerotic heart disease of native coronary artery without angina pectoris: Secondary | ICD-10-CM | POA: Insufficient documentation

## 2015-07-31 DIAGNOSIS — Z7982 Long term (current) use of aspirin: Secondary | ICD-10-CM | POA: Diagnosis not present

## 2015-07-31 DIAGNOSIS — Z7984 Long term (current) use of oral hypoglycemic drugs: Secondary | ICD-10-CM | POA: Insufficient documentation

## 2015-07-31 DIAGNOSIS — G43909 Migraine, unspecified, not intractable, without status migrainosus: Secondary | ICD-10-CM | POA: Insufficient documentation

## 2015-07-31 DIAGNOSIS — R51 Headache: Secondary | ICD-10-CM

## 2015-07-31 DIAGNOSIS — Z79899 Other long term (current) drug therapy: Secondary | ICD-10-CM | POA: Diagnosis not present

## 2015-07-31 DIAGNOSIS — Z8673 Personal history of transient ischemic attack (TIA), and cerebral infarction without residual deficits: Secondary | ICD-10-CM | POA: Insufficient documentation

## 2015-07-31 DIAGNOSIS — F17210 Nicotine dependence, cigarettes, uncomplicated: Secondary | ICD-10-CM | POA: Insufficient documentation

## 2015-07-31 DIAGNOSIS — Z8701 Personal history of pneumonia (recurrent): Secondary | ICD-10-CM | POA: Insufficient documentation

## 2015-07-31 DIAGNOSIS — E119 Type 2 diabetes mellitus without complications: Secondary | ICD-10-CM | POA: Insufficient documentation

## 2015-07-31 DIAGNOSIS — Z8709 Personal history of other diseases of the respiratory system: Secondary | ICD-10-CM | POA: Insufficient documentation

## 2015-07-31 DIAGNOSIS — Z9889 Other specified postprocedural states: Secondary | ICD-10-CM | POA: Diagnosis not present

## 2015-07-31 DIAGNOSIS — Z951 Presence of aortocoronary bypass graft: Secondary | ICD-10-CM | POA: Diagnosis not present

## 2015-07-31 DIAGNOSIS — E785 Hyperlipidemia, unspecified: Secondary | ICD-10-CM | POA: Insufficient documentation

## 2015-07-31 DIAGNOSIS — I10 Essential (primary) hypertension: Secondary | ICD-10-CM | POA: Diagnosis not present

## 2015-07-31 DIAGNOSIS — R519 Headache, unspecified: Secondary | ICD-10-CM

## 2015-07-31 MED ORDER — SODIUM CHLORIDE 0.9 % IV BOLUS (SEPSIS)
1000.0000 mL | Freq: Once | INTRAVENOUS | Status: AC
  Administered 2015-07-31: 1000 mL via INTRAVENOUS

## 2015-07-31 MED ORDER — DIPHENHYDRAMINE HCL 50 MG/ML IJ SOLN
25.0000 mg | Freq: Once | INTRAMUSCULAR | Status: AC
  Administered 2015-07-31: 25 mg via INTRAVENOUS
  Filled 2015-07-31: qty 1

## 2015-07-31 MED ORDER — KETOROLAC TROMETHAMINE 30 MG/ML IJ SOLN
30.0000 mg | Freq: Once | INTRAMUSCULAR | Status: AC
  Administered 2015-08-01: 30 mg via INTRAVENOUS
  Filled 2015-07-31: qty 1

## 2015-07-31 MED ORDER — METOCLOPRAMIDE HCL 5 MG/ML IJ SOLN
10.0000 mg | Freq: Once | INTRAMUSCULAR | Status: AC
Start: 2015-07-31 — End: 2015-07-31
  Administered 2015-07-31: 10 mg via INTRAVENOUS
  Filled 2015-07-31: qty 2

## 2015-07-31 MED ORDER — ONDANSETRON 4 MG PO TBDP
ORAL_TABLET | ORAL | Status: AC
  Administered 2015-07-31: 4 mg
  Filled 2015-07-31: qty 1

## 2015-07-31 MED ORDER — FENTANYL CITRATE (PF) 100 MCG/2ML IJ SOLN
INTRAMUSCULAR | Status: AC
  Administered 2015-07-31: 100 ug
  Filled 2015-07-31: qty 2

## 2015-07-31 NOTE — ED Notes (Signed)
Pt has DDD and occasionally gets a nerve that compresses causing her severe headaches. 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

## 2015-08-01 MED ORDER — ONDANSETRON HCL 8 MG PO TABS: 8.0000 mg | ORAL_TABLET | Freq: Three times a day (TID) | ORAL | Status: DC | PRN

## 2015-08-01 MED ORDER — OXYCODONE-ACETAMINOPHEN 5-325 MG PO TABS: 1.0000 | ORAL_TABLET | ORAL | Status: DC | PRN

## 2015-08-01 MED ORDER — HYDROMORPHONE HCL 1 MG/ML IJ SOLN
1.0000 mg | Freq: Once | INTRAMUSCULAR | Status: AC
  Administered 2015-08-01: 1 mg via INTRAVENOUS
  Filled 2015-08-01: qty 1

## 2015-08-01 NOTE — ED Notes (Signed)
PT st's she has had a severe headache since approx 8pm   Also nausea with vomiting.

## 2015-08-01 NOTE — ED Provider Notes (Signed)
CSN: [id number]     Arrival date & time 07/31/15  2124 History   First MD Initiated Contact with Patient 07/31/15 2309     Chief Complaint  Patient presents with  . Headache     (Consider location/radiation/quality/duration/timing/severity/associated sxs/prior Treatment) HPI.Marland Kitchenpatient with long standing hx of migraines presents with left temporal HA since 8 pm.  No fever, chills, stiff neck, stiff neck.  Severity is moderate  Past Medical History  Diagnosis Date  . Hyperlipidemia   . IHD (ischemic heart disease)   . PAOD (peripheral arterial occlusive disease) (HCC)   . Carotid bruit   . Diabetes mellitus   . Hypertension   . PVD (peripheral vascular disease) (HCC)   . Coronary artery disease   . Dizziness   . Persistent headaches   . Pneumonia Jan. 2014  . Bronchitis Jan 2014    became pneumonia  . Stroke (HCC)   . Headache   . 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 Cardiac catheterization  04/28/2001  . 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 US echocardiography  07/16/2004    EF 55-60%  . Cardiovascular stress test  11/23/2006    EF 64%  . Carotid-subclavian bypass graft  2003   Family History  Problem Relation Age of Onset  . Cancer Mother     Tiffany Kocher  . Cancer Father     Prostate  . Heart disease Brother     Cardiac Infraction   Social History  Substance Use Topics  . Smoking status: Current Every Day Smoker -- 0.50 packs/day    Types: Cigarettes  . Smokeless tobacco: Never Used  . Alcohol Use: No   OB History    No data available     Review of Systems  All other systems reviewed and are negative.     Allergies  Erythromycin and Lipitor  Home Medications   Prior to Admission medications   Medication Sig Start Date End Date Taking? Authorizing Provider  aspirin 81 MG EC tablet  Take 81 mg by mouth daily.      Historical Provider, MD  CRESTOR 10 MG tablet TAKE ONE TABLET BY MOUTH ONCE DAILY AFTER  SUPPER Patient not taking: Reported on 10/23/2014 02/13/14   Cassell Clement, MD  CRESTOR 20 MG tablet daily. 10/22/14   Historical Provider, MD  cyclobenzaprine (FLEXERIL) 10 MG tablet TAKE ONE-HALF TABLET BY MOUTH AT BEDTIME FOR ONE WEEK, AND THEN INCREASE TO ONE TABLET DAILY AT BEDTIME 01/01/15   Levert Feinstein, MD  glipiZIDE (GLUCOTROL) 5 MG tablet Take 2 tablets (10 mg total) by mouth 2 (two) times daily before a meal. 09/06/12   Cassell Clement, MD  lisinopril (PRINIVIL,ZESTRIL) 10 MG tablet Take 1 tablet (10 mg total) by mouth 2 (two) times daily. 08/07/13   Cassell Clement, MD  metFORMIN (GLUCOPHAGE) 1000 MG tablet Take 1,000 mg by mouth. Take two daily by mouth and 1/2 at night. Increased.    Historical Provider, MD  metoprolol (LOPRESSOR) 50 MG tablet 1/2 TWICE A DAY 08/07/13   Cassell Clement, MD  ondansetron (ZOFRAN) 8 MG tablet Take 1 tablet (8 mg total) by mouth 3 (three) times daily as needed for nausea or vomiting. 08/01/15   Donnetta Hutching, MD  oxyCODONE-acetaminophen (PERCOCET) 5-325 MG tablet Take 1-2 tablets by mouth every  4 (four) hours as needed. 08/01/15   Donnetta Hutching, MD  PARoxetine (PAXIL) 20 MG tablet Take 1 tablet (20 mg total) by mouth every morning. 01/27/12   Cassell Clement, MD   BP 197/84 mmHg  Pulse 70  Temp(Src) 98.9 F (37.2 C)  Resp 24  SpO2 94% Physical Exam  Constitutional: She is oriented to person, place, and time. She appears well-developed and well-nourished.  Slight photophobia  HENT:  Head: Normocephalic and atraumatic.  Eyes: Conjunctivae and EOM are normal. Pupils are equal, round, and reactive to light.  Neck: Normal range of motion. Neck supple.  Cardiovascular: Normal rate and regular rhythm.   Pulmonary/Chest: Effort normal and breath sounds normal.  Abdominal: Soft. Bowel sounds are normal.  Musculoskeletal: Normal range of motion.   Neurological: She is alert and oriented to person, place, and time.  Skin: Skin is warm and dry.  Psychiatric: She has a normal mood and affect. Her behavior is normal.  Nursing note and vitals reviewed.   ED Course  Procedures (including critical care time) Labs Review Labs Reviewed - No data to display  Imaging Review No results found. I have personally reviewed and evaluated these images and lab results as part of my medical decision-making.   EKG Interpretation None      MDM   Final diagnoses:  Nonintractable episodic headache, unspecified headache type    Pt feels much better after ivfs and pain rx    Donnetta Hutching, MD 08/01/15 2303

## 2015-08-01 NOTE — Discharge Instructions (Signed)
Medications for pain and nausea. Increase fluids. Follow-up your primary care doctor. °

## 2015-08-01 NOTE — ED Notes (Signed)
Pt vomiting at this time.  Husband at bedside

## 2015-08-28 ENCOUNTER — Encounter: Payer: Self-pay | Admitting: Family

## 2015-09-04 ENCOUNTER — Encounter: Payer: Self-pay | Admitting: Family

## 2015-09-04 ENCOUNTER — Ambulatory Visit (HOSPITAL_COMMUNITY)
Admission: RE | Admit: 2015-09-04 | Discharge: 2015-09-04 | Disposition: A | Discharge status: Home / Self Care | Payer: Medicare Other | Source: Ambulatory Visit | Attending: Family | Admitting: Family

## 2015-09-04 ENCOUNTER — Ambulatory Visit (INDEPENDENT_AMBULATORY_CARE_PROVIDER_SITE_OTHER)
Admission: RE | Admit: 2015-09-04 | Discharge: 2015-09-04 | Disposition: A | Discharge status: Home / Self Care | Payer: Medicare Other | Source: Ambulatory Visit | Attending: Family | Admitting: Family

## 2015-09-04 ENCOUNTER — Ambulatory Visit (INDEPENDENT_AMBULATORY_CARE_PROVIDER_SITE_OTHER): Payer: Medicare Other | Admitting: Family

## 2015-09-04 VITALS — BP 129/71 | HR 67 | Ht 65.0 in | Wt 131.0 lb

## 2015-09-04 DIAGNOSIS — Z95828 Presence of other vascular implants and grafts: Secondary | ICD-10-CM | POA: Diagnosis not present

## 2015-09-04 DIAGNOSIS — Z720 Tobacco use: Secondary | ICD-10-CM

## 2015-09-04 DIAGNOSIS — I708 Atherosclerosis of other arteries: Secondary | ICD-10-CM

## 2015-09-04 DIAGNOSIS — E785 Hyperlipidemia, unspecified: Secondary | ICD-10-CM | POA: Diagnosis not present

## 2015-09-04 DIAGNOSIS — I6523 Occlusion and stenosis of bilateral carotid arteries: Secondary | ICD-10-CM

## 2015-09-04 DIAGNOSIS — F17200 Nicotine dependence, unspecified, uncomplicated: Secondary | ICD-10-CM

## 2015-09-04 DIAGNOSIS — I10 Essential (primary) hypertension: Secondary | ICD-10-CM | POA: Insufficient documentation

## 2015-09-04 DIAGNOSIS — I7409 Other arterial embolism and thrombosis of abdominal aorta: Secondary | ICD-10-CM

## 2015-09-04 DIAGNOSIS — I739 Peripheral vascular disease, unspecified: Secondary | ICD-10-CM | POA: Insufficient documentation

## 2015-09-04 DIAGNOSIS — E119 Type 2 diabetes mellitus without complications: Secondary | ICD-10-CM | POA: Insufficient documentation

## 2015-09-04 DIAGNOSIS — E1151 Type 2 diabetes mellitus with diabetic peripheral angiopathy without gangrene: Secondary | ICD-10-CM

## 2015-09-04 DIAGNOSIS — I771 Stricture of artery: Secondary | ICD-10-CM

## 2015-09-04 NOTE — Patient Instructions (Addendum)
Stroke Prevention Some medical conditions and behaviors are associated with an increased chance of having a stroke. You may prevent a stroke by making healthy choices and managing medical conditions. HOW CAN I REDUCE MY RISK OF HAVING A STROKE?   Stay physically active. Get at least 30 minutes of activity on most or all days.  Do not smoke. It may also be helpful to avoid exposure to secondhand smoke.  Limit alcohol use. Moderate alcohol use is considered to be:  No more than 2 drinks per day for men.  No more than 1 drink per day for nonpregnant women.  Eat healthy foods. This involves:  Eating 5 or more servings of fruits and vegetables a day.  Making dietary changes that address high blood pressure (hypertension), high cholesterol, diabetes, or obesity.  Manage your cholesterol levels.  Making food choices that are high in fiber and low in saturated fat, trans fat, and cholesterol may control cholesterol levels.  Take any prescribed medicines to control cholesterol as directed by your health care provider.  Manage your diabetes.  Controlling your carbohydrate and sugar intake is recommended to manage diabetes.  Take any prescribed medicines to control diabetes as directed by your health care provider.  Control your hypertension.  Making food choices that are low in salt (sodium), saturated fat, trans fat, and cholesterol is recommended to manage hypertension.  Ask your health care provider if you need treatment to lower your blood pressure. Take any prescribed medicines to control hypertension as directed by your health care provider.  If you are 18-39 years of age, have your blood pressure checked every 3-5 years. If you are 40 years of age or older, have your blood pressure checked every year.  Maintain a healthy weight.  Reducing calorie intake and making food choices that are low in sodium, saturated fat, trans fat, and cholesterol are recommended to manage  weight.  Stop drug abuse.  Avoid taking birth control pills.  Talk to your health care provider about the risks of taking birth control pills if you are over 35 years old, smoke, get migraines, or have ever had a blood clot.  Get evaluated for sleep disorders (sleep apnea).  Talk to your health care provider about getting a sleep evaluation if you snore a lot or have excessive sleepiness.  Take medicines only as directed by your health care provider.  For some people, aspirin or blood thinners (anticoagulants) are helpful in reducing the risk of forming abnormal blood clots that can lead to stroke. If you have the irregular heart rhythm of atrial fibrillation, you should be on a blood thinner unless there is a good reason you cannot take them.  Understand all your medicine instructions.  Make sure that other conditions (such as anemia or atherosclerosis) are addressed. SEEK IMMEDIATE MEDICAL CARE IF:   You have sudden weakness or numbness of the face, arm, or leg, especially on one side of the body.  Your face or eyelid droops to one side.  You have sudden confusion.  You have trouble speaking (aphasia) or understanding.  You have sudden trouble seeing in one or both eyes.  You have sudden trouble walking.  You have dizziness.  You have a loss of balance or coordination.  You have a sudden, severe headache with no known cause.  You have new chest pain or an irregular heartbeat. Any of these symptoms may represent a serious problem that is an emergency. Do not wait to see if the symptoms will   go away. Get medical help at once. Call your local emergency services (911 in [place] Do not drive yourself to the hospital.   This information is not intended to replace advice given to you by your health care provider. Make sure you discuss any questions you have with your health care provider.   Document Released: 07/29/2004 Document Revised: 07/12/2014 Document Reviewed:  12/22/2012 Elsevier Interactive Patient Education 2016 [employer]    Smoking Cessation, Tips for Success If you are ready to quit smoking, congratulations! You have chosen to help yourself be healthier. Cigarettes bring nicotine, tar, carbon monoxide, and other irritants into your body. Your lungs, heart, and blood vessels will be able to work better without these poisons. There are many different ways to quit smoking. Nicotine gum, nicotine patches, a nicotine inhaler, or nicotine nasal spray can help with physical craving. Hypnosis, support groups, and medicines help break the habit of smoking. WHAT THINGS CAN I DO TO MAKE QUITTING EASIER?  Here are some tips to help you quit for good:  Pick a date when you will quit smoking completely. Tell all of your friends and family about your plan to quit on that date.  Do not try to slowly cut down on the number of cigarettes you are smoking. Pick a quit date and quit smoking completely starting on that day.  Throw away all cigarettes.   Clean and remove all ashtrays from your home, work, and car.  On a card, write down your reasons for quitting. Carry the card with you and read it when you get the urge to smoke.  Cleanse your body of nicotine. Drink enough water and fluids to keep your urine clear or pale yellow. Do this after quitting to flush the nicotine from your body.  Learn to predict your moods. Do not let a bad situation be your excuse to have a cigarette. Some situations in your life might tempt you into wanting a cigarette.  Never have "just one" cigarette. It leads to wanting another and another. Remind yourself of your decision to quit.  Change habits associated with smoking. If you smoked while driving or when feeling stressed, try other activities to replace smoking. Stand up when drinking your coffee. Brush your teeth after eating. Sit in a different chair when you read the paper. Avoid alcohol while trying to quit, and try to  drink fewer caffeinated beverages. Alcohol and caffeine may urge you to smoke.  Avoid foods and drinks that can trigger a desire to smoke, such as sugary or spicy foods and alcohol.  Ask people who smoke not to smoke around you.  Have something planned to do right after eating or having a cup of coffee. For example, plan to take a walk or exercise.  Try a relaxation exercise to calm you down and decrease your stress. Remember, you may be tense and nervous for the first 2 weeks after you quit, but this will pass.  Find new activities to keep your hands busy. Play with a pen, coin, or rubber band. Doodle or draw things on paper.  Brush your teeth right after eating. This will help cut down on the craving for the taste of tobacco after meals. You can also try mouthwash.   Use oral substitutes in place of cigarettes. Try using lemon drops, carrots, cinnamon sticks, or chewing gum. Keep them handy so they are available when you have the urge to smoke.  When you have the urge to smoke, try deep breathing.    Designate your home as a nonsmoking area.  If you are a heavy smoker, ask your health care provider about a prescription for nicotine chewing gum. It can ease your withdrawal from nicotine.  Reward yourself. Set aside the cigarette money you save and buy yourself something nice.  Look for support from others. Join a support group or smoking cessation program. Ask someone at home or at work to help you with your plan to quit smoking.  Always ask yourself, "Do I need this cigarette or is this just a reflex?" Tell yourself, "Today, I choose not to smoke," or "I do not want to smoke." You are reminding yourself of your decision to quit.  Do not replace cigarette smoking with electronic cigarettes (commonly called e-cigarettes). The safety of e-cigarettes is unknown, and some may contain harmful chemicals.  If you relapse, do not give up! Plan ahead and think about what you will do the next  time you get the urge to smoke. HOW WILL I FEEL WHEN I QUIT SMOKING? You may have symptoms of withdrawal because your body is used to nicotine (the addictive substance in cigarettes). You may crave cigarettes, be irritable, feel very hungry, cough often, get headaches, or have difficulty concentrating. The withdrawal symptoms are only temporary. They are strongest when you first quit but will go away within 10-14 days. When withdrawal symptoms occur, stay in control. Think about your reasons for quitting. Remind yourself that these are signs that your body is healing and getting used to being without cigarettes. Remember that withdrawal symptoms are easier to treat than the major diseases that smoking can cause.  Even after the withdrawal is over, expect periodic urges to smoke. However, these cravings are generally short lived and will go away whether you smoke or not. Do not smoke! WHAT RESOURCES ARE AVAILABLE TO HELP ME QUIT SMOKING? Your health care provider can direct you to community resources or hospitals for support, which may include:  Group support.  Education.  Hypnosis.  Therapy.   This information is not intended to replace advice given to you by your health care provider. Make sure you discuss any questions you have with your health care provider.   Document Released: 03/19/2004 Document Revised: 07/12/2014 Document Reviewed: 12/07/2012 Elsevier Interactive Patient Education 2016 [employer]   Steps to Quit Smoking  Smoking tobacco can be harmful to your health and can affect almost every organ in your body. Smoking puts you, and those around you, at risk for developing many serious chronic diseases. Quitting smoking is difficult, but it is one of the best things that you can do for your health. It is never too late to quit. WHAT ARE THE BENEFITS OF QUITTING SMOKING? When you quit smoking, you lower your risk of developing serious diseases and conditions, such as:  Lung  cancer or lung disease, such as COPD.  Heart disease.  Stroke.  Heart attack.  Infertility.  Osteoporosis and bone fractures. Additionally, symptoms such as coughing, wheezing, and shortness of breath may get better when you quit. You may also find that you get sick less often because your body is stronger at fighting off colds and infections. If you are pregnant, quitting smoking can help to reduce your chances of having a baby of low birth weight. HOW DO I GET READY TO QUIT? When you decide to quit smoking, create a plan to make sure that you are successful. Before you quit:  Pick a date to quit. Set a date within the   next two weeks to give you time to prepare.  Write down the reasons why you are quitting. Keep this list in places where you will see it often, such as on your bathroom mirror or in your car or wallet.  Identify the people, places, things, and activities that make you want to smoke (triggers) and avoid them. Make sure to take these actions:  Throw away all cigarettes at home, at work, and in your car.  Throw away smoking accessories, such as ashtrays and lighters.  Clean your car and make sure to empty the ashtray.  Clean your home, including curtains and carpets.  Tell your family, friends, and coworkers that you are quitting. Support from your loved ones can make quitting easier.  Talk with your health care provider about your options for quitting smoking.  Find out what treatment options are covered by your health insurance. WHAT STRATEGIES CAN I USE TO QUIT SMOKING?  Talk with your healthcare provider about different strategies to quit smoking. Some strategies include:  Quitting smoking altogether instead of gradually lessening how much you smoke over a period of time. Research shows that quitting "cold turkey" is more successful than gradually quitting.  Attending in-person counseling to help you build problem-solving skills. You are more likely to have  success in quitting if you attend several counseling sessions. Even short sessions of 10 minutes can be effective.  Finding resources and support systems that can help you to quit smoking and remain smoke-free after you quit. These resources are most helpful when you use them often. They can include:  Online chats with a counselor.  Telephone quitlines.  Printed self-help materials.  Support groups or group counseling.  Text messaging programs.  Mobile phone applications.  Taking medicines to help you quit smoking. (If you are pregnant or breastfeeding, talk with your health care provider first.) Some medicines contain nicotine and some do not. Both types of medicines help with cravings, but the medicines that include nicotine help to relieve withdrawal symptoms. Your health care provider may recommend:  Nicotine patches, gum, or lozenges.  Nicotine inhalers or sprays.  Non-nicotine medicine that is taken by mouth. Talk with your health care provider about combining strategies, such as taking medicines while you are also receiving in-person counseling. Using these two strategies together makes you more likely to succeed in quitting than if you used either strategy on its own. If you are pregnant or breastfeeding, talk with your health care provider about finding counseling or other support strategies to quit smoking. Do not take medicine to help you quit smoking unless told to do so by your health care provider. WHAT THINGS CAN I DO TO MAKE IT EASIER TO QUIT? Quitting smoking might feel overwhelming at first, but there is a lot that you can do to make it easier. Take these important actions:  Reach out to your family and friends and ask that they support and encourage you during this time. Call telephone quitlines, reach out to support groups, or work with a counselor for support.  Ask people who smoke to avoid smoking around you.  Avoid places that trigger you to smoke, such as bars,  parties, or smoke-break areas at work.  Spend time around people who do not smoke.  Lessen stress in your life, because stress can be a smoking trigger for some people. To lessen stress, try:  Exercising regularly.  Deep-breathing exercises.  Yoga.  Meditating.  Performing a body scan. This involves closing your eyes, scanning   your body from head to toe, and noticing which parts of your body are particularly tense. Purposefully relax the muscles in those areas.  Download or purchase mobile phone or tablet apps (applications) that can help you stick to your quit plan by providing reminders, tips, and encouragement. There are many free apps, such as QuitGuide from the CDC (Centers for Disease Control and Prevention). You can find other support for quitting smoking (smoking cessation) through smokefree.gov and other websites. HOW WILL I FEEL WHEN I QUIT SMOKING? Within the first 24 hours of quitting smoking, you may start to feel some withdrawal symptoms. These symptoms are usually most noticeable 2-3 days after quitting, but they usually do not last beyond 2-3 weeks. Changes or symptoms that you might experience include:  Mood swings.  Restlessness, anxiety, or irritation.  Difficulty concentrating.  Dizziness.  Strong cravings for sugary foods in addition to nicotine.  Mild weight gain.  Constipation.  Nausea.  Coughing or a sore throat.  Changes in how your medicines work in your body.  A depressed mood.  Difficulty sleeping (insomnia). After the first 2-3 weeks of quitting, you may start to notice more positive results, such as:  Improved sense of smell and taste.  Decreased coughing and sore throat.  Slower heart rate.  Lower blood pressure.  Clearer skin.  The ability to breathe more easily.  Fewer sick days. Quitting smoking is very challenging for most people. Do not get discouraged if you are not successful the first time. Some people need to make many  attempts to quit before they achieve long-term success. Do your best to stick to your quit plan, and talk with your health care provider if you have any questions or concerns.   This information is not intended to replace advice given to you by your health care provider. Make sure you discuss any questions you have with your health care provider.   Document Released: 06/15/2001 Document Revised: 11/05/2014 Document Reviewed: 11/05/2014 Elsevier Interactive Patient Education 2016 [employer]   

## 2015-09-04 NOTE — Progress Notes (Signed)
Chief Complaint: Extracranial Carotid Artery Stenosis   History of Present Illness  Danielle Farrell is a 60 y.o. female  patient of Dr. Hart Rochester who is s/p left carotid subclavian anastomosis in 2003 for left arm claudication which remains asymptomatic. She also has no lower extremity claudication having undergone aortobifemoral bypass graft by Dr. Hart Rochester in 2002. She had coronary artery bypass grafting in the past and denies any active cardiac symptoms. She returns today for yearly follow up. Has had a stoke a long time ago which she does not remember experiencing.  States she also has chronic left cerebellar strokes, she states her symptoms were due to migraine headaches with left eye sight abnormalities, denies hemiparesis.  She denies symptoms associated with claudication, denies non healing wounds.  She has recurrent migraine headaches, has photosensitivity.  In her office note from March 2015 Dr. Terrace Arabia notes patient's left pupil is smaller than the right, which is the case now. Patient reports C5-7 disc protrusion.  Has Ehlers-Danlus syndrome (excessive ROM of her joints).  Has had labile blood pressure that she states has stabilized with medication adjustment. Denies steal symptoms, but does have tingling in left fingers that she attributes to C-spine issues and possibly Topamax which she is being weaned off.   Pt Diabetic: Yes, states last A1C was 8.4, increased from 7.5 Pt smoker: smoker (1/2 ppd x since age 46 yrs)  Pt meds include: Statin : Yes, Crestor dose increased per pt, myalgias in thighs then worsened ASA: Yes Other anticoagulants/antiplatelets: no    Past Medical History  Diagnosis Date  . Hyperlipidemia   . IHD (ischemic heart disease)   . PAOD (peripheral arterial occlusive disease) (HCC)   . Carotid bruit   . Diabetes mellitus   . Hypertension   . PVD (peripheral vascular disease) (HCC)   . Coronary artery disease   . Dizziness   . Persistent headaches    . Pneumonia Jan. 2014  . Bronchitis Jan 2014    became pneumonia  . Stroke (HCC)   . Headache   . Migraine headache     Severe Posterior Subluxation of C6  over C7 and C5 over C6 w/broad based disc protusion at C5-6  resulting in canal  narrowing  . DDD (degenerative disc disease), cervical     Social History Social History  Substance Use Topics  . Smoking status: Current Every Day Smoker -- 0.50 packs/day    Types: Cigarettes  . Smokeless tobacco: Never Used  . Alcohol Use: No    Family History Family History  Problem Relation Age of Onset  . Cancer Mother     Tiffany Kocher  . Cancer Father     Prostate  . Heart disease Brother     Cardiac Infraction    Surgical History Past Surgical History  Procedure Laterality Date  . Cardiac catheterization  04/28/2001  . 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 US echocardiography  07/16/2004    EF 55-60%  . Cardiovascular stress test  11/23/2006    EF 64%  . Carotid-subclavian bypass graft  2003    Allergies  Allergen Reactions  . Erythromycin Nausea And Vomiting and Swelling    Throw up  . Lipitor [Atorvastatin Calcium]     Muscle cramps in legs    Current Outpatient Prescriptions  Medication Sig Dispense Refill  . aspirin 81 MG EC tablet Take 81 mg by mouth daily.      . CRESTOR 10 MG tablet TAKE ONE TABLET BY  MOUTH ONCE DAILY AFTER  SUPPER 30 tablet 0  . cyclobenzaprine (FLEXERIL) 10 MG tablet TAKE ONE-HALF TABLET BY MOUTH AT BEDTIME FOR ONE WEEK, AND THEN INCREASE TO ONE TABLET DAILY AT BEDTIME 30 tablet 3  . glipiZIDE (GLUCOTROL) 5 MG tablet Take 2 tablets (10 mg total) by mouth 2 (two) times daily before a meal. 360 tablet 1  . lisinopril (PRINIVIL,ZESTRIL) 10 MG tablet Take 1 tablet (10 mg total) by mouth 2 (two) times daily. 180 tablet 3  . metFORMIN (GLUCOPHAGE) 1000 MG tablet Take 1,000 mg by mouth. Take two daily by mouth and 1/2 at night. Increased.    . metoprolol (LOPRESSOR) 50 MG tablet 1/2 TWICE A DAY 90  tablet 3  . ondansetron (ZOFRAN) 8 MG tablet Take 1 tablet (8 mg total) by mouth 3 (three) times daily as needed for nausea or vomiting. 10 tablet 0  . oxyCODONE-acetaminophen (PERCOCET) 5-325 MG tablet Take 1-2 tablets by mouth every 4 (four) hours as needed. 15 tablet 0  . PARoxetine (PAXIL) 20 MG tablet Take 1 tablet (20 mg total) by mouth every morning. 90 tablet 3  . CRESTOR 20 MG tablet daily. Reported on 09/04/2015  0   No current facility-administered medications for this visit.    Review of Systems : See HPI for pertinent positives and negatives.  Physical Examination  Filed Vitals:   09/04/15 0842 09/04/15 0843  BP: 137/87 129/71  Pulse: 67   Height:  (1.651 m)   Weight: 131 lb (59.421 kg)   SpO2: 96%    Body mass index is 21.8 kg/(m^2).  General: WDWN female in NAD GAIT: normal Eyes: Left pupil smaller than the right, no other abnormalities noted other than left ptosis. Pulmonary: Non-labored, diminished air movement, +rales in both lung bases, no rhonchi or wheezing.  Cardiac: regular rhythm, no detected murmur.  VASCULAR EXAM Carotid Bruits Left Right   Positive positive   Aorta is not palpable. Radial pulses are 2+ palpable and equal.      LE Pulses LEFT RIGHT   FEMORAL  palpable  palpable    POPLITEAL not palpable  not palpable   POSTERIOR TIBIAL  palpable   palpable    DORSALIS PEDIS  ANTERIOR TIBIAL palpable  palpable     Gastrointestinal: soft, nontender, BS WNL, no r/g, no palpable masses.  Musculoskeletal: No muscle atrophy/wasting. M/S 5/5 throughout, Extremities without ischemic changes  Neurologic: A&O X 3; Appropriate Affect,  Speech is normal CN 2-12 intact except left pupil remains smaller than right and left eyelid ptosis , Pain  and light touch intact in extremities, Motor exam as listed above.               Non-Invasive Vascular Imaging CAROTID DUPLEX 09/04/2015   CEREBROVASCULAR DUPLEX EVALUATION    INDICATION: Carotid stenosis    PREVIOUS INTERVENTION(S): Left carotid /subclavian transposition 08/16/2001    DUPLEX EXAM:     RIGHT  LEFT  Peak Systolic Velocities (cm/s) End Diastolic Velocities (cm/s) Plaque LOCATION Peak Systolic Velocities (cm/s) End Diastolic Velocities (cm/s) Plaque  94 20  CCA PROXIMAL 139 23   91 21  CCA MID 124 30 HT  88 24 HT CCA DISTAL 99 32 HT  128 14  ECA 106 20 CP  153 37 HT ICA PROXIMAL 85 21 CP  83 21  ICA MID 74 27   74 22  ICA DISTAL 49 17     1.7 ICA / CCA Ratio (PSV) 0.8  Antegrade Vertebral  Flow Antegrade   Brachial Systolic Pressure (mmHg)   Triphasic Brachial Artery Waveforms Triphasic    Plaque Morphology:  HM = Homogeneous, HT = Heterogeneous, CP = Calcific Plaque, SP = Smooth Plaque, IP = Irregular Plaque     ADDITIONAL FINDINGS:     IMPRESSION: Right internal Carotid artery PSV is consistent with a 40%-59% range.. Left internal carotid artery stenosis present in the less than 1-39% range, calcific plaque present making Doppler interrogation difficult and may be underestimating disease. Patent left subclavian/common carotid artery transposition.    Compared to the previous exam:  No significant changes noted compared to previous exam of 10/23/2014.     ABI (Date: 09/04/2015)  R: 1.0 (0.95, 10/23/14), DP: triphasic, PT: triphasic, TBI: 0.70  L: 1.1 (1.0), DP: triphasic, PT: triphasic, TBI: 0.72    Assessment: Danielle Farrell is a 60 y.o. female who is s/p left carotid subclavian anastomosis in 2003 for left arm claudication which remains asymptomatic. She also has no lower extremity claudication having undergone aortobifemoral bypass graft by Dr. Hart Rochester in 2002. Today's carotid Duplex suggests 40%-59% right internal carotid artery stenosis and left  internal carotid artery stenosis less than 40%. Patent left subclavian/common carotid artery transposition. Essentially unchanged since study performed on 10/23/14.   Bilateral ABI and TBI remain normal with all triphasic waveforms.   Her prominent atherosclerotic risk factors remain uncontrolled DM and continued smoking.   Plan:  The patient was counseled re smoking cessation and given several free resources re smoking cessation.  Graduated walking program discussed in detail.   Follow-up in 1 year with Carotid Duplex scan and ABI's.    I discussed in depth with the patient the nature of atherosclerosis, and emphasized the importance of maximal medical management including strict control of blood pressure, blood glucose, and lipid levels, obtaining regular exercise, and cessation of smoking.  The patient is aware that without maximal medical management the underlying atherosclerotic disease process will progress, limiting the benefit of any interventions. The patient was given information about stroke prevention and what symptoms should prompt the patient to seek immediate medical care. Thank you for allowing [place] to participate in this patient's care.  Charisse March, RN, MSN, FNP-C [facility] Office: [phone]  Clinic Physician: Darrick Penna  09/04/2015 8:58 AM

## 2016-09-13 ENCOUNTER — Encounter (HOSPITAL_COMMUNITY): Payer: Medicare Other

## 2016-09-13 ENCOUNTER — Ambulatory Visit: Payer: Medicare Other | Admitting: Family

## 2016-10-20 ENCOUNTER — Encounter: Payer: Self-pay | Admitting: Family

## 2016-10-20 ENCOUNTER — Ambulatory Visit (INDEPENDENT_AMBULATORY_CARE_PROVIDER_SITE_OTHER): Payer: Medicare Other | Admitting: Cardiology

## 2016-10-20 ENCOUNTER — Encounter: Payer: Self-pay | Admitting: Cardiology

## 2016-10-20 VITALS — BP 128/66 | HR 67 | Ht 65.0 in | Wt 127.0 lb

## 2016-10-20 DIAGNOSIS — Z951 Presence of aortocoronary bypass graft: Secondary | ICD-10-CM | POA: Diagnosis not present

## 2016-10-20 DIAGNOSIS — I119 Hypertensive heart disease without heart failure: Secondary | ICD-10-CM

## 2016-10-20 DIAGNOSIS — E785 Hyperlipidemia, unspecified: Secondary | ICD-10-CM | POA: Diagnosis not present

## 2016-10-20 DIAGNOSIS — I25118 Atherosclerotic heart disease of native coronary artery with other forms of angina pectoris: Secondary | ICD-10-CM

## 2016-10-20 DIAGNOSIS — I6521 Occlusion and stenosis of right carotid artery: Secondary | ICD-10-CM | POA: Diagnosis not present

## 2016-10-20 HISTORY — PX: NM MYOVIEW LTD: HXRAD82

## 2016-10-20 MED ORDER — ROSUVASTATIN CALCIUM 10 MG PO TABS: ORAL_TABLET | ORAL | 3 refills | Status: DC

## 2016-10-20 NOTE — Progress Notes (Signed)
PCP: Kaleen Mask, MD  Clinic Note: Chief Complaint  Patient presents with  . New Evaluation    Former patient ? 3 yrs  . Coronary Artery Disease  . PAD    HPI: Danielle Farrell is a 61 y.o. female with a PMH below who presents today for New patient evaluation to reestablish cardiology care. She formerly seen by Dr. Patty Sermons for blood pressure and headache. She is status post CABG October 2002 he previously followed by Dr. Hart Rochester from last surgery for L SCA occlusion s/p Carotid- anastomosis.Marland Kitchen She was diagnosed with Lorinda Creed with hyperelasticjoints. h/o Ao Bifem Bypass 2002 with L Renal Artery bypass. Has had a stoke a long time ago which she does not remember experiencing.  States she also has chronic left cerebellar strokes, she states her symptoms were due to migraine headaches with left eye sight abnormalities, denies hemiparesis.  Danielle Farrell was last seen on 08/07/2013 by Dr. Patty Sermons. Apparently she restarted smoking. He plans to increase her metoprolol to 25 twice a day and lisinopril to 10 twice a day. She was supposed to follow back up 4 months later, and did not show - was never called back.   Recent Hospitalizations: n/a  Studies Reviewed:   2-D Echo December 2014: EF 60-65%. No R WMA. GRII DD. Normal valves Carotid and lower extremity ABI  Cardiac cath May 2002 performed for class IV angina (across the back): Distal left main 50-60%. Very proximal LAD 80-90%. Mid LAD 50-60%. Circumflex 50% ostial involving the distal left main. There is OM 1 OM 2. RCA proximal 50% however there is damping of catheter. Referred for CABG after proximal LAD lesion.  CABG 4  04/2001: (free RIMA-LAD, RIMA-RCA, SVG-D1, SVG-OM1) and Dr. Cornelius Moras.  Neuro Hospitalization in Dec 2014 - followed by Neuro; ? Irritability on R side of brain.    Interval History: Despite having a quite complicated history of PAD/carotid disease and CAD, she somehow lost to follow-up for cardiology.  Thankfully, she is doing quite well from a cardiology standpoint has not had any resting or exertional anginal symptoms. She tries to get around as much as possible, and is mostly limited by deconditioning at this point. Since she was last seen, some her medications were cut back because of dizziness and hypotension. She is taking 10 mg lisinopril twice a day she is taking her metoprolol/Lopressor 25 twice a day. She is actually taking her Crestor cut back to 5 mg because of some aching when taking the 10 mg dose.  Otherwise she really has not had any resting or exertional chest pain. Of activity that she does. She's been a little bit out of sorts because of her recent neurologic event. As result she's not been doing that much activity recently. She denies any PND orthopnea or edema. Rare palpitations, but no rapid irregular heartbeat. No chest pain or shortness of breath with rest or exertion. No PND, orthopnea or edema. No palpitations, lightheadedness, dizziness, weakness or syncope/near syncope. -- Denies any subclavian steal type symptoms. No TIA/amaurosis fugax symptoms. No melena, hematochezia, hematuria, or epstaxis. No claudication. No issues with poor wound healing.  ROS: A comprehensive was performed. Review of Systems  Constitutional: Positive for malaise/fatigue.  HENT: Negative for hearing loss and nosebleeds.   Respiratory: Positive for shortness of breath. Negative for cough (Chronic cough).        Chronic mild respiratory issues  Gastrointestinal: Negative for blood in stool and melena.  Genitourinary: Negative for dysuria and hematuria.  Musculoskeletal: Positive for back pain, joint pain and neck pain.  Neurological: Positive for dizziness (Off-and-on).  Psychiatric/Behavioral: Negative for depression. The patient is nervous/anxious.   All other systems reviewed and are negative.   Past Medical History:  Diagnosis Date  . Bronchitis Jan 2014   became pneumonia  . Carotid  bruit   . Coronary artery disease   . DDD (degenerative disc disease), cervical   . Diabetes mellitus   . Dizziness   . Headache   . Hyperlipidemia   . Hypertension   . IHD (ischemic heart disease)   . Migraine headache    Severe Posterior Subluxation of C6  over C7 and C5 over C6 w/broad based disc protusion at C5-6  resulting in canal  narrowing  . PAOD (peripheral arterial occlusive disease) (HCC)    s/p AoBifem bypass & L Carotid-Subclavian Bypass  . Persistent headaches   . Pneumonia Jan. 2014  . PVD (peripheral vascular disease) (HCC)   . Stroke [facility])     Past Surgical History:  Procedure Laterality Date  . AORTO-FEMORAL BYPASS GRAFT  02/2001   Dr. Hart Rochester.   Ao-BiFem wioth L Renal A bypass.  Marland Kitchen CARDIAC CATHETERIZATION  04/28/2001  . CARDIOVASCULAR STRESS TEST  11/23/2006   EF 64%  . CAROTID-SUBCLAVIAN BYPASS GRAFT Left 2003   Dr. Hart Rochester  . CATARACT EXTRACTION Right 09/2000  . CORONARY ARTERY BYPASS GRAFT  05/03/2001   Dr. Cornelius Moras; x 4.  free RIMA-LAD, RIMA-RCA, SVG-D1, SVG-OM1  . US ECHOCARDIOGRAPHY  07/16/2004   EF 55-60%    Current Meds  Medication Sig  . aspirin 81 MG EC tablet Take 81 mg by mouth daily.    Marland Kitchen glipiZIDE (GLUCOTROL) 10 MG tablet Take 10 mg by mouth daily before breakfast. TAKE 1 AND 1/2 TABLETS (15 MG) IN THE MORNRING AND 1 TABLET (10 MG) AT NIGHT.  Marland Kitchen lisinopril (PRINIVIL,ZESTRIL) 20 MG tablet Take 10 mg by mouth 2 (two) times daily.  . metFORMIN (GLUCOPHAGE) 1000 MG tablet Take 1,000 mg by mouth. TAKE 1 TABLET IN THE MORNING AND TAKE 1 AND 1/2 AT NIGHT.  Marland Kitchen metoprolol (LOPRESSOR) 50 MG tablet 1/2 TWICE A DAY  . ondansetron (ZOFRAN) 8 MG tablet Take 1 tablet (8 mg total) by mouth 3 (three) times daily as needed for nausea or vomiting.  Marland Kitchen oxyCODONE-acetaminophen (PERCOCET) 5-325 MG tablet Take 1-2 tablets by mouth every 4 (four) hours as needed.  Marland Kitchen PARoxetine (PAXIL) 10 MG tablet Take 10 mg by mouth daily.  . rosuvastatin (CRESTOR) 10 MG tablet TAKE ONE  TABLET BY MOUTH ONCE DAILY AFTER  SUPPER  . [DISCONTINUED] CRESTOR 10 MG tablet TAKE ONE TABLET BY MOUTH ONCE DAILY AFTER  SUPPER  . [DISCONTINUED] PARoxetine (PAXIL) 20 MG tablet Take 1 tablet (20 mg total) by mouth every morning.    Allergies  Allergen Reactions  . Erythromycin Nausea And Vomiting and Swelling    Throw up  . Lipitor [Atorvastatin Calcium] Other (See Comments)    Muscle cramps in legs    Social History   Social History  . Marital status: Single    Spouse name: [name]  . Number of children: 0  . Years of education: 12   Occupational History  .  Disabled    Disabled   Social History Main Topics  . Smoking status: Current Every Day Smoker    Packs/day: 0.50    Types: Cigarettes  . Smokeless tobacco: Never Used  . Alcohol use No  . Drug use: No  .  Sexual activity: Not Currently   Other Topics Concern  . None   Social History Narrative   Patient lives at home with her husband [name]).   Disabled.   Right handed   Education 12 th grade   Caffeine- One cup of coffee daily.    family history includes Cancer in her father and mother; Heart attack in her paternal grandfather; Heart attack (age of onset: 77) in her brother.  Wt Readings from Last 3 Encounters:  10/20/16 127 lb (57.6 kg)  09/04/15 131 lb (59.4 kg)  10/23/14 134 lb (60.8 kg)    PHYSICAL EXAM BP 128/66   Pulse 67   Ht  (1.651 m)   Wt 127 lb (57.6 kg)   BMI 21.13 kg/m  General appearance: alert, cooperative, appears stated age, no distress and Other than being a little bit thin, she is well-nourished and well-groomed HEENT: /AT, EOMI, MMM, anicteric sclera Neck: no adenopathy, no carotid bruit and no JVD Lungs: clear to auscultation bilaterally, normal percussion bilaterally and non-labored Heart: regular rate and rhythm, S1& S2 normal, no murmur, click, rub or gallop  Abdomen: soft, non-tender; bowel sounds normal; no masses,  no organomegaly; n/a Extremities: extremities  normal, atraumatic, no cyanosis, and edema trace Pulses: Slightly diminished left radial pulse versus right. Bilateral pedal pulses are palpable. What diminished. Skin: Dry, leathery skin. Mild swelling, but no significant edema. Neurologic: Mental status: Alert, oriented, thought content appropriate Cranial nerves: normal (II-XII grossly intact)    Adult ECG Report  Rate: 67 ;  Rhythm: normal sinus rhythm and L atrial anomaly, Non-specific ST-T wave changes;   Narrative Interpretation: otherwise normal study   Other studies Reviewed: Additional studies/ records that were reviewed today include:  Recent Labs: n/a   ASSESSMENT / PLAN: Problem List Items Addressed This Visit    Benign hypertensive heart disease without heart failure    Blood pressure looks very good today with current dosing of lisinopril 10 twice a day and metoprolol 12.5. Continue to monitor.      Relevant Medications   lisinopril (PRINIVIL,ZESTRIL) 20 MG tablet   rosuvastatin (CRESTOR) 10 MG tablet   Other Relevant Orders   EKG 12-Lead (Completed)   Myocardial Perfusion Imaging   Carotid stenosis, asymptomatic   Relevant Medications   lisinopril (PRINIVIL,ZESTRIL) 20 MG tablet   rosuvastatin (CRESTOR) 10 MG tablet   Other Relevant Orders   Myocardial Perfusion Imaging   Coronary artery disease involving native coronary artery of native heart with angina pectoris (HCC) - Primary (Chronic)    It is been over 8 years since her last evaluation. She is due for subsequent evaluation, especially distended a new baseline. Plan: Check Lexiscan Myoview. No active anginal symptoms. Continue aspirin along with statin, beta blocker and ACE inhibitor. We can adjust medications in the future depending on blood pressures and lipids.      Relevant Medications   lisinopril (PRINIVIL,ZESTRIL) 20 MG tablet   rosuvastatin (CRESTOR) 10 MG tablet   Hx of CABG (Chronic)    Now she is having intermittent symptoms are not really  classic anginal symptoms, but is far from her bypass surgery. Plan: Surveillance Myoview.      Relevant Orders   EKG 12-Lead (Completed)   Myocardial Perfusion Imaging   Hyperlipidemia with target low density lipoprotein (LDL) cholesterol less than 70 mg/dL (Chronic)    She takes 5 mg of Crestor every day. Her last lipid panel showed total cholesterol 182,) 150, HDL 44 and LDL 108. This  clearly wasn't at goal. I think we need to go back to moderate dose of rosuvastatin; Recheck labs in 3-4 months      Relevant Medications   lisinopril (PRINIVIL,ZESTRIL) 20 MG tablet   rosuvastatin (CRESTOR) 10 MG tablet      Current medicines are reviewed at length with the patient today. (+/- concerns) none The following changes have been made: n/a  Patient Instructions    Medication change  Like to increase Crestor /generic rosuvastatin to 10 mg at bedtime   Schedule at [address]  Your physician has requested that you have a lexiscan myoview. For further information please visit [email or web address] Please follow instruction sheet, as given.  Your physician wants you to follow-up in 6 months with Dr Herbie Baltimore.You will receive a reminder letter in the mail two months in advance. If you don't receive a letter, please call our office to schedule the follow-up appointment.   If you need a refill on your cardiac medications before your next appointment, please call your pharmacy.     Studies Ordered:   Orders Placed This Encounter  Procedures  . Myocardial Perfusion Imaging  . EKG 12-Lead      Bryan Lemma, M.D., M.S. Interventional Cardiologist   Pager # [phone] Phone # [phone] [address] [place], [place] [address]

## 2016-10-20 NOTE — Patient Instructions (Signed)
Medication change  Like to increase Crestor /generic rosuvastatin to 10 mg at bedtime   Schedule at [address]  Your physician has requested that you have a lexiscan myoview. For further information please visit [email or web address] Please follow instruction sheet, as given.  Your physician wants you to follow-up in 6 months with Dr Herbie Baltimore.You will receive a reminder letter in the mail two months in advance. If you don't receive a letter, please call our office to schedule the follow-up appointment.   If you need a refill on your cardiac medications before your next appointment, please call your pharmacy.

## 2016-10-22 ENCOUNTER — Telehealth (HOSPITAL_COMMUNITY): Payer: Self-pay

## 2016-10-22 ENCOUNTER — Encounter: Payer: Self-pay | Admitting: Cardiology

## 2016-10-22 NOTE — Assessment & Plan Note (Addendum)
She takes 5 mg of Crestor every day. Her last lipid panel showed total cholesterol 182,) 150, HDL 44 and LDL 108. This clearly wasn't at goal. I think we need to go back to moderate dose of rosuvastatin; Recheck labs in 3-4 months

## 2016-10-22 NOTE — Assessment & Plan Note (Signed)
Now she is having intermittent symptoms are not really classic anginal symptoms, but is far from her bypass surgery. Plan: Surveillance Myoview.

## 2016-10-22 NOTE — Assessment & Plan Note (Addendum)
It is been over 8 years since her last evaluation. She is due for subsequent evaluation, especially distended a new baseline. Plan: Check Lexiscan Myoview. No active anginal symptoms. Continue aspirin along with statin, beta blocker and ACE inhibitor. We can adjust medications in the future depending on blood pressures and lipids.

## 2016-10-22 NOTE — Assessment & Plan Note (Signed)
Blood pressure looks very good today with current dosing of lisinopril 10 twice a day and metoprolol 12.5. Continue to monitor.

## 2016-10-22 NOTE — Telephone Encounter (Signed)
Encounter complete. 

## 2016-10-27 ENCOUNTER — Ambulatory Visit (HOSPITAL_COMMUNITY)
Admission: RE | Admit: 2016-10-27 | Discharge: 2016-10-27 | Disposition: A | Discharge status: Home / Self Care | Payer: Medicare Other | Source: Ambulatory Visit | Attending: Cardiovascular Disease | Admitting: Cardiovascular Disease

## 2016-10-27 DIAGNOSIS — R0609 Other forms of dyspnea: Secondary | ICD-10-CM | POA: Diagnosis not present

## 2016-10-27 DIAGNOSIS — I6521 Occlusion and stenosis of right carotid artery: Secondary | ICD-10-CM

## 2016-10-27 DIAGNOSIS — Z951 Presence of aortocoronary bypass graft: Secondary | ICD-10-CM | POA: Diagnosis not present

## 2016-10-27 DIAGNOSIS — Z8249 Family history of ischemic heart disease and other diseases of the circulatory system: Secondary | ICD-10-CM | POA: Insufficient documentation

## 2016-10-27 DIAGNOSIS — I119 Hypertensive heart disease without heart failure: Secondary | ICD-10-CM

## 2016-10-27 DIAGNOSIS — R42 Dizziness and giddiness: Secondary | ICD-10-CM | POA: Diagnosis not present

## 2016-10-27 DIAGNOSIS — E1151 Type 2 diabetes mellitus with diabetic peripheral angiopathy without gangrene: Secondary | ICD-10-CM | POA: Diagnosis not present

## 2016-10-27 DIAGNOSIS — I25118 Atherosclerotic heart disease of native coronary artery with other forms of angina pectoris: Secondary | ICD-10-CM

## 2016-10-27 LAB — MYOCARDIAL PERFUSION IMAGING
CHL CUP NUCLEAR SDS: 2
LV dias vol: 71 mL (ref 46–106)
LVSYSVOL: 23 mL
NUC STRESS TID: 1.07
Peak HR: 82 {beats}/min
Rest HR: 63 {beats}/min
SRS: 5
SSS: 7

## 2016-10-27 MED ORDER — AMINOPHYLLINE 25 MG/ML IV SOLN
75.0000 mg | Freq: Once | INTRAVENOUS | Status: AC
  Administered 2016-10-27: 75 mg via INTRAVENOUS

## 2016-10-27 MED ORDER — TECHNETIUM TC 99M TETROFOSMIN IV KIT
32.0000 | PACK | Freq: Once | INTRAVENOUS | Status: AC | PRN
  Administered 2016-10-27: 32 via INTRAVENOUS
  Filled 2016-10-27: qty 32

## 2016-10-27 MED ORDER — REGADENOSON 0.4 MG/5ML IV SOLN
0.4000 mg | Freq: Once | INTRAVENOUS | Status: AC
  Administered 2016-10-27: 0.4 mg via INTRAVENOUS

## 2016-10-27 MED ORDER — TECHNETIUM TC 99M TETROFOSMIN IV KIT
10.7000 | PACK | Freq: Once | INTRAVENOUS | Status: AC | PRN
  Administered 2016-10-27: 10.7 via INTRAVENOUS
  Filled 2016-10-27: qty 11

## 2016-10-28 ENCOUNTER — Ambulatory Visit (HOSPITAL_COMMUNITY)
Admission: RE | Admit: 2016-10-28 | Discharge: 2016-10-28 | Disposition: A | Discharge status: Home / Self Care | Payer: Medicare Other | Source: Ambulatory Visit | Attending: Family | Admitting: Family

## 2016-10-28 ENCOUNTER — Ambulatory Visit (INDEPENDENT_AMBULATORY_CARE_PROVIDER_SITE_OTHER): Payer: Medicare Other | Admitting: Family

## 2016-10-28 ENCOUNTER — Ambulatory Visit (INDEPENDENT_AMBULATORY_CARE_PROVIDER_SITE_OTHER)
Admission: RE | Admit: 2016-10-28 | Discharge: 2016-10-28 | Disposition: A | Discharge status: Home / Self Care | Payer: Medicare Other | Source: Ambulatory Visit | Attending: Family | Admitting: Family

## 2016-10-28 ENCOUNTER — Encounter: Payer: Self-pay | Admitting: Family

## 2016-10-28 VITALS — BP 107/70 | HR 60 | Temp 97.5°F | Resp 18 | Ht 67.0 in | Wt 127.0 lb

## 2016-10-28 DIAGNOSIS — E1151 Type 2 diabetes mellitus with diabetic peripheral angiopathy without gangrene: Secondary | ICD-10-CM | POA: Diagnosis not present

## 2016-10-28 DIAGNOSIS — I7409 Other arterial embolism and thrombosis of abdominal aorta: Secondary | ICD-10-CM

## 2016-10-28 DIAGNOSIS — I771 Stricture of artery: Secondary | ICD-10-CM | POA: Insufficient documentation

## 2016-10-28 DIAGNOSIS — F17200 Nicotine dependence, unspecified, uncomplicated: Secondary | ICD-10-CM | POA: Diagnosis not present

## 2016-10-28 DIAGNOSIS — Z95828 Presence of other vascular implants and grafts: Secondary | ICD-10-CM

## 2016-10-28 DIAGNOSIS — R0989 Other specified symptoms and signs involving the circulatory and respiratory systems: Secondary | ICD-10-CM | POA: Diagnosis present

## 2016-10-28 DIAGNOSIS — I6523 Occlusion and stenosis of bilateral carotid arteries: Secondary | ICD-10-CM | POA: Diagnosis not present

## 2016-10-28 NOTE — Progress Notes (Signed)
Chief Complaint: Follow up Extracranial Carotid Artery Stenosis   History of Present Illness  Danielle Farrell is a 61 y.o. female patient of Dr. Hart Rochester who is s/p left carotid subclavian anastomosis in 2003 for left arm claudication which remains asymptomatic. She also has no lower extremity claudication having undergone aortobifemoral bypass graft by Dr. Hart Rochester in 2002. She had coronary artery bypass grafting in the past and denies any active cardiac symptoms. She returns today for follow up.  Has had a stoke a long time ago which she does not remember experiencing.  She states her symptoms were due to migraine headaches with left eye sight abnormalities, denies hemiparesis. She has more recently been diagnosed with c-spine issues that causes headaches and severe N&V.  Patient reports C4-7 disc protrusion.  She denies symptoms associated with claudication, denies non healing wounds.  She has recurrent migraine headaches, has photosensitivity.  In her office note from March 2015 Dr. Terrace Arabia notes patient's left pupil is smaller than the right, which is the case now.  Has Ehlers-Danlus syndrome (excessive ROM of her joints).  Has had labile blood pressure that she states has stabilized with medication adjustment. She denies steal symptoms in her upper extremities but does have tingling in left fingers that she attributes to C-spine issues.   Pt Diabetic: Yes, states last A1C was 8.5, not improved from prior A1C of 8.4 Pt smoker: smoker (1/2 ppd x since age 67 yrs)  Pt meds include: Statin : Yes, Crestor dose increased per pt, myalgias in thighs then worsened ASA: Yes Other anticoagulants/antiplatelets: no   Past Medical History:  Diagnosis Date  . Bronchitis Jan 2014   became pneumonia  . Carotid bruit   . Coronary artery disease   . DDD (degenerative disc disease), cervical   . Diabetes mellitus   . Dizziness   . Headache   . Hyperlipidemia   . Hypertension   . IHD  (ischemic heart disease)   . Migraine headache    Severe Posterior Subluxation of C6  over C7 and C5 over C6 w/broad based disc protusion at C5-6  resulting in canal  narrowing  . PAOD (peripheral arterial occlusive disease) (HCC)    s/p AoBifem bypass & L Carotid-Subclavian Bypass  . Persistent headaches   . Pneumonia Jan. 2014  . PVD (peripheral vascular disease) (HCC)   . Stroke [facility])     Social History Social History  Substance Use Topics  . Smoking status: Current Every Day Smoker    Packs/day: 0.50    Years: 43.00    Types: Cigarettes  . Smokeless tobacco: Never Used  . Alcohol use No    Family History Family History  Problem Relation Age of Onset  . Cancer Mother     Tiffany Kocher  . Cancer Father     Prostate  . Heart attack Brother 46    Cardiac Infraction  . Heart attack Paternal Grandfather     Surgical History Past Surgical History:  Procedure Laterality Date  . AORTO-FEMORAL BYPASS GRAFT  02/2001   Dr. Hart Rochester.   Ao-BiFem wioth L Renal A bypass.  Marland Kitchen CARDIAC CATHETERIZATION  04/28/2001  . CARDIOVASCULAR STRESS TEST  11/23/2006   EF 64%  . CAROTID-SUBCLAVIAN BYPASS GRAFT Left 2003   Dr. Hart Rochester  . CATARACT EXTRACTION Right 09/2000  . CORONARY ARTERY BYPASS GRAFT  05/03/2001   Dr. Cornelius Moras; x 4.  free RIMA-LAD, RIMA-RCA, SVG-D1, SVG-OM1  . US ECHOCARDIOGRAPHY  07/16/2004   EF 55-60%  Allergies  Allergen Reactions  . Erythromycin Nausea And Vomiting and Swelling    Throw up  . Lipitor [Atorvastatin Calcium] Other (See Comments)    Muscle cramps in legs    Current Outpatient Prescriptions  Medication Sig Dispense Refill  . aspirin 81 MG EC tablet Take 81 mg by mouth daily.      Marland Kitchen glipiZIDE (GLUCOTROL) 10 MG tablet Take 10 mg by mouth daily before breakfast. TAKE 1 AND 1/2 TABLETS (15 MG) IN THE MORNRING AND 1 TABLET (10 MG) AT NIGHT.    Marland Kitchen lisinopril (PRINIVIL,ZESTRIL) 20 MG tablet Take 10 mg by mouth 2 (two) times daily.    . metFORMIN (GLUCOPHAGE) 1000 MG  tablet Take 1,000 mg by mouth. TAKE 1 TABLET IN THE MORNING AND TAKE 1 AND 1/2 AT NIGHT.    Marland Kitchen metoprolol (LOPRESSOR) 50 MG tablet 1/2 TWICE A DAY 90 tablet 3  . ondansetron (ZOFRAN) 8 MG tablet Take 1 tablet (8 mg total) by mouth 3 (three) times daily as needed for nausea or vomiting. 10 tablet 0  . oxyCODONE-acetaminophen (PERCOCET) 5-325 MG tablet Take 1-2 tablets by mouth every 4 (four) hours as needed. 15 tablet 0  . PARoxetine (PAXIL) 10 MG tablet Take 10 mg by mouth daily.    . rosuvastatin (CRESTOR) 10 MG tablet TAKE ONE TABLET BY MOUTH ONCE DAILY AFTER  SUPPER 90 tablet 3   No current facility-administered medications for this visit.     Review of Systems : See HPI for pertinent positives and negatives.  Physical Examination  Vitals:   10/28/16 0853 10/28/16 0855  BP: 127/75 107/70  Pulse: 60   Resp: 18   Temp: 97.5 F (36.4 C)   TempSrc: Oral   SpO2: 99%   Weight: 127 lb (57.6 kg)   Height:  (1.702 m)    Body mass index is 19.89 kg/m.  General: WDWN thin female in NAD GAIT: normal Eyes: Left pupil smaller than the right, no other abnormalities noted. Pulmonary: Respirations are non-labored, diminished air movement, no rales, rhonchi, or wheezing.  Cardiac: regular rhythm, no detected murmur.  VASCULAR EXAM Carotid Bruits Left Right   Positive positive   Aorta is not palpable. Radial pulses are 2+ palpable and equal.      LE Pulses LEFT RIGHT   FEMORAL  palpable  palpable    POPLITEAL not palpable  not palpable   POSTERIOR TIBIAL  palpable   palpable    DORSALIS PEDIS  ANTERIOR TIBIAL palpable  palpable     Gastrointestinal: soft, nontender, BS WNL, no r/g, no palpable masses.  Musculoskeletal: No muscle atrophy/wasting. M/S 5/5 throughout,  Extremities without ischemic changes  Neurologic: A&O X 3; Appropriate Affect,  Speech is normal CN 2-12 intact except left pupil remains smaller than right, Pain and light touch intact in extremities, Motor exam as listed above.     Assessment: Danielle Farrell is a 61 y.o. female who is s/p left carotid subclavian anastomosis in 2003 for left arm claudication which remains asymptomatic. She also has no lower extremity claudication having undergone aortobifemoral bypass graft by Dr. Hart Rochester in 2002. There are no signs of ischemia in her feet or legs, her pedal pulses are palpable.  She has a history of a remote stroke with no residual deficits, and states this may have been a migraine headache.   Her prominent atherosclerotic risk factors remain uncontrolled DM and continued smoking, see Plan.   DATA Today's carotid Duplex suggests 60%-79% right internal carotid  artery stenosis and left internal carotid artery stenosis less than 40%. Patent left subclavian/common carotid artery transposition. Bilateral vertebral artery flow is antegrade.  Bilateral subclavian artery waveforms are normal.  Disease progression of the right internal carotid artery compared to the last exam on 09-04-15.   Bilateral ABI and TBI remain normal with all triphasic waveforms.    Plan:  The patient was counseled re smoking cessation and given several free resources re smoking cessation. I advised her to work closely with her medical provider that helps her manage her DM to get her DM under as good control as possible to reduce her risk of stroke, MI, loss of perfusion to her legs again, et al.  Graduated walking program discussed.   Follow-up in 6 months with Carotid Duplex scan, ABI's in a year  I discussed in depth with the patient the nature of atherosclerosis, and emphasized the importance of maximal medical management including strict control of blood pressure, blood glucose, and lipid levels,  obtaining regular exercise, and cessation of smoking.  The patient is aware that without maximal medical management the underlying atherosclerotic disease process will progress, limiting the benefit of any interventions. The patient was given information about stroke prevention and what symptoms should prompt the patient to seek immediate medical care. Thank you for allowing [place] to participate in this patient's care.  Charisse March, RN, MSN, FNP-C [facility] Office: [phone]  Clinic Physician: Early  10/28/16 8:57 AM

## 2016-10-28 NOTE — Patient Instructions (Signed)
Stroke Prevention Some medical conditions and behaviors are associated with an increased chance of having a stroke. You may prevent a stroke by making healthy choices and managing medical conditions. How can I reduce my risk of having a stroke?  Stay physically active. Get at least 30 minutes of activity on most or all days.  Do not smoke. It may also be helpful to avoid exposure to secondhand smoke.  Limit alcohol use. Moderate alcohol use is considered to be:  No more than 2 drinks per day for men.  No more than 1 drink per day for nonpregnant women.  Eat healthy foods. This involves:  Eating 5 or more servings of fruits and vegetables a day.  Making dietary changes that address high blood pressure (hypertension), high cholesterol, diabetes, or obesity.  Manage your cholesterol levels.  Making food choices that are high in fiber and low in saturated fat, trans fat, and cholesterol may control cholesterol levels.  Take any prescribed medicines to control cholesterol as directed by your health care provider.  Manage your diabetes.  Controlling your carbohydrate and sugar intake is recommended to manage diabetes.  Take any prescribed medicines to control diabetes as directed by your health care provider.  Control your hypertension.  Making food choices that are low in salt (sodium), saturated fat, trans fat, and cholesterol is recommended to manage hypertension.  Ask your health care provider if you need treatment to lower your blood pressure. Take any prescribed medicines to control hypertension as directed by your health care provider.  If you are 18-39 years of age, have your blood pressure checked every 3-5 years. If you are 40 years of age or older, have your blood pressure checked every year.  Maintain a healthy weight.  Reducing calorie intake and making food choices that are low in sodium, saturated fat, trans fat, and cholesterol are recommended to manage  weight.  Stop drug abuse.  Avoid taking birth control pills.  Talk to your health care provider about the risks of taking birth control pills if you are over 35 years old, smoke, get migraines, or have ever had a blood clot.  Get evaluated for sleep disorders (sleep apnea).  Talk to your health care provider about getting a sleep evaluation if you snore a lot or have excessive sleepiness.  Take medicines only as directed by your health care provider.  For some people, aspirin or blood thinners (anticoagulants) are helpful in reducing the risk of forming abnormal blood clots that can lead to stroke. If you have the irregular heart rhythm of atrial fibrillation, you should be on a blood thinner unless there is a good reason you cannot take them.  Understand all your medicine instructions.  Make sure that other conditions (such as anemia or atherosclerosis) are addressed. Get help right away if:  You have sudden weakness or numbness of the face, arm, or leg, especially on one side of the body.  Your face or eyelid droops to one side.  You have sudden confusion.  You have trouble speaking (aphasia) or understanding.  You have sudden trouble seeing in one or both eyes.  You have sudden trouble walking.  You have dizziness.  You have a loss of balance or coordination.  You have a sudden, severe headache with no known cause.  You have new chest pain or an irregular heartbeat. Any of these symptoms may represent a serious problem that is an emergency. Do not wait to see if the symptoms will go away.   Get medical help at once. Call your local emergency services (911 in [place] Do not drive yourself to the hospital. This information is not intended to replace advice given to you by your health care provider. Make sure you discuss any questions you have with your health care provider. Document Released: 07/29/2004 Document Revised: 11/27/2015 Document Reviewed: 12/22/2012 Elsevier  Interactive Patient Education  2017 Elsevier Inc.     Peripheral Vascular Disease Peripheral vascular disease (PVD) is a disease of the blood vessels that are not part of your heart and brain. A simple term for PVD is poor circulation. In most cases, PVD narrows the blood vessels that carry blood from your heart to the rest of your body. This can result in a decreased supply of blood to your arms, legs, and internal organs, like your stomach or kidneys. However, it most often affects a person's lower legs and feet. There are two types of PVD.  Organic PVD. This is the more common type. It is caused by damage to the structure of blood vessels.  Functional PVD. This is caused by conditions that make blood vessels contract and tighten (spasm). Without treatment, PVD tends to get worse over time. PVD can also lead to acute ischemic limb. This is when an arm or limb suddenly has trouble getting enough blood. This is a medical emergency. Follow these instructions at home:  Take medicines only as told by your doctor.  Do not use any tobacco products, including cigarettes, chewing tobacco, or electronic cigarettes. If you need help quitting, ask your doctor.  Lose weight if you are overweight, and maintain a healthy weight as told by your doctor.  Eat a diet that is low in fat and cholesterol. If you need help, ask your doctor.  Exercise regularly. Ask your doctor for some good activities for you.  Take good care of your feet.  Wear comfortable shoes that fit well.  Check your feet often for any cuts or sores. Contact a doctor if:  You have cramps in your legs while walking.  You have leg pain when you are at rest.  You have coldness in a leg or foot.  Your skin changes.  You are unable to get or have an erection (erectile dysfunction).  You have cuts or sores on your feet that are not healing. Get help right away if:  Your arm or leg turns cold and blue.  Your arms or legs  become red, warm, swollen, painful, or numb.  You have chest pain or trouble breathing.  You suddenly have weakness in your face, arm, or leg.  You become very confused or you cannot speak.  You suddenly have a very bad headache.  You suddenly cannot see. This information is not intended to replace advice given to you by your health care provider. Make sure you discuss any questions you have with your health care provider. Document Released: 09/15/2009 Document Revised: 11/27/2015 Document Reviewed: 11/29/2013 Elsevier Interactive Patient Education  2017 Elsevier Inc.       Steps to Quit Smoking Smoking tobacco can be bad for your health. It can also affect almost every organ in your body. Smoking puts you and people around you at risk for many serious long-lasting (chronic) diseases. Quitting smoking is hard, but it is one of the best things that you can do for your health. It is never too late to quit. What are the benefits of quitting smoking? When you quit smoking, you lower your risk for getting serious   diseases and conditions. They can include:  Lung cancer or lung disease.  Heart disease.  Stroke.  Heart attack.  Not being able to have children (infertility).  Weak bones (osteoporosis) and broken bones (fractures). If you have coughing, wheezing, and shortness of breath, those symptoms may get better when you quit. You may also get sick less often. If you are pregnant, quitting smoking can help to lower your chances of having a baby of low birth weight. What can I do to help me quit smoking? Talk with your doctor about what can help you quit smoking. Some things you can do (strategies) include:  Quitting smoking totally, instead of slowly cutting back how much you smoke over a period of time.  Going to in-person counseling. You are more likely to quit if you go to many counseling sessions.  Using resources and support systems, such as:  Online chats with a  counselor.  Phone quitlines.  Printed self-help materials.  Support groups or group counseling.  Text messaging programs.  Mobile phone apps or applications.  Taking medicines. Some of these medicines may have nicotine in them. If you are pregnant or breastfeeding, do not take any medicines to quit smoking unless your doctor says it is okay. Talk with your doctor about counseling or other things that can help you. Talk with your doctor about using more than one strategy at the same time, such as taking medicines while you are also going to in-person counseling. This can help make quitting easier. What things can I do to make it easier to quit? Quitting smoking might feel very hard at first, but there is a lot that you can do to make it easier. Take these steps:  Talk to your family and friends. Ask them to support and encourage you.  Call phone quitlines, reach out to support groups, or work with a counselor.  Ask people who smoke to not smoke around you.  Avoid places that make you want (trigger) to smoke, such as:  Bars.  Parties.  Smoke-break areas at work.  Spend time with people who do not smoke.  Lower the stress in your life. Stress can make you want to smoke. Try these things to help your stress:  Getting regular exercise.  Deep-breathing exercises.  Yoga.  Meditating.  Doing a body scan. To do this, close your eyes, focus on one area of your body at a time from head to toe, and notice which parts of your body are tense. Try to relax the muscles in those areas.  Download or buy apps on your mobile phone or tablet that can help you stick to your quit plan. There are many free apps, such as QuitGuide from the CDC (Centers for Disease Control and Prevention). You can find more support from smokefree.gov and other websites. This information is not intended to replace advice given to you by your health care provider. Make sure you discuss any questions you have with  your health care provider. Document Released: 04/17/2009 Document Revised: 02/17/2016 Document Reviewed: 11/05/2014 Elsevier Interactive Patient Education  2017 Elsevier Inc.  

## 2016-10-29 ENCOUNTER — Telehealth: Payer: Self-pay | Admitting: *Deleted

## 2016-10-29 NOTE — Telephone Encounter (Addendum)
LEFT MESSAGE TO CALL BACK IN REGARDS TO STRESS TEST RESULTS - ALSO RELEASED RESULT TO MY CHART ROUTED INFORMATION TO DR Jeannetta Nap -PCP

## 2016-10-29 NOTE — Telephone Encounter (Signed)
-----   Message from Marykay Lex, MD sent at 10/29/2016  1:57 PM EDT ----- Stress Test looked good!! No sign of significant Heart Artery Disease.  Pump function is normal.  Good news!!.  HARDING,DAVID W, MD   Pls forward to PCP - Dr. Jeannetta Nap

## 2016-10-29 NOTE — Progress Notes (Signed)
Stress Test looked good!! No sign of significant Heart Artery Disease.  Pump function is normal.  Good news!!.  HARDING,DAVID W, MD   Pls forward to PCP - Dr. Jeannetta Nap

## 2016-10-29 NOTE — Addendum Note (Signed)
Addended by: Burton Apley A on: 10/29/2016 09:04 AM   Modules accepted: Orders

## 2017-03-03 ENCOUNTER — Other Ambulatory Visit: Payer: Self-pay

## 2017-03-03 DIAGNOSIS — Z1231 Encounter for screening mammogram for malignant neoplasm of breast: Secondary | ICD-10-CM

## 2017-03-03 DIAGNOSIS — E2839 Other primary ovarian failure: Secondary | ICD-10-CM

## 2017-03-17 ENCOUNTER — Ambulatory Visit
Admission: RE | Admit: 2017-03-17 | Discharge: 2017-03-17 | Disposition: A | Discharge status: Home / Self Care | Payer: Medicare Other | Source: Ambulatory Visit | Attending: Family Medicine | Admitting: Family Medicine

## 2017-03-17 DIAGNOSIS — E2839 Other primary ovarian failure: Secondary | ICD-10-CM

## 2017-03-17 DIAGNOSIS — Z1231 Encounter for screening mammogram for malignant neoplasm of breast: Secondary | ICD-10-CM

## 2017-04-29 ENCOUNTER — Ambulatory Visit (HOSPITAL_COMMUNITY)
Admission: RE | Admit: 2017-04-29 | Discharge: 2017-04-29 | Disposition: A | Discharge status: Home / Self Care | Payer: Medicare Other | Source: Ambulatory Visit | Attending: Family | Admitting: Family

## 2017-04-29 ENCOUNTER — Ambulatory Visit (INDEPENDENT_AMBULATORY_CARE_PROVIDER_SITE_OTHER): Payer: Medicare Other | Admitting: Family

## 2017-04-29 ENCOUNTER — Encounter: Payer: Self-pay | Admitting: Family

## 2017-04-29 DIAGNOSIS — I6523 Occlusion and stenosis of bilateral carotid arteries: Secondary | ICD-10-CM | POA: Insufficient documentation

## 2017-04-29 DIAGNOSIS — I7409 Other arterial embolism and thrombosis of abdominal aorta: Secondary | ICD-10-CM | POA: Diagnosis not present

## 2017-04-29 DIAGNOSIS — I771 Stricture of artery: Secondary | ICD-10-CM | POA: Diagnosis not present

## 2017-04-29 DIAGNOSIS — Z95828 Presence of other vascular implants and grafts: Secondary | ICD-10-CM | POA: Diagnosis not present

## 2017-04-29 DIAGNOSIS — F17200 Nicotine dependence, unspecified, uncomplicated: Secondary | ICD-10-CM | POA: Diagnosis not present

## 2017-04-29 DIAGNOSIS — E1151 Type 2 diabetes mellitus with diabetic peripheral angiopathy without gangrene: Secondary | ICD-10-CM | POA: Diagnosis not present

## 2017-04-29 LAB — VAS US CAROTID
LCCADSYS: -101 cm/s
LCCAPDIAS: 25 cm/s
LEFT ECA DIAS: -28 cm/s
LEFT VERTEBRAL DIAS: -11 cm/s
LICADSYS: -77 cm/s
Left CCA dist dias: -32 cm/s
Left CCA prox sys: 118 cm/s
Left ICA dist dias: -25 cm/s
Left ICA prox dias: -15 cm/s
Left ICA prox sys: -54 cm/s
RCCADSYS: -85 cm/s
RCCAPSYS: 85 cm/s
RIGHT CCA MID DIAS: 19 cm/s
RIGHT ECA DIAS: -15 cm/s
RIGHT VERTEBRAL DIAS: 9 cm/s
Right CCA prox dias: 14 cm/s

## 2017-04-29 NOTE — Progress Notes (Signed)
Vitals:   04/29/17 0853 04/29/17 0856  BP: (!) 142/83 137/71  Pulse: (!) 53 (!) 58  Resp: 14   Temp: (!) 97.5 F (36.4 C)   SpO2: 98%   Weight: 126 lb (57.2 kg)   Height: 5\' 5"  (1.651 m)

## 2017-04-29 NOTE — Progress Notes (Signed)
Chief Complaint: Follow up Extracranial Carotid Artery Stenosis   History of Present Illness  Danielle Farrell is a 61 y.o. female patient of Dr. Hart Rochester who is s/p left carotid subclavian anastomosis in 2003 for left arm claudication which remains asymptomatic. She also has no lower extremity claudication having undergone aortobifemoral bypass graft by Dr. Hart Rochester in 2002. She had coronary artery bypass grafting in the past and denies any active cardiac symptoms. She returns today for follow up.  Has had a stoke a long time ago which she does not remember experiencing.  She states her symptoms were due to migraine headaches with left eye sight abnormalities, denies hemiparesis. She states the vision in her left eye has improved greatly since her cataract surgery.  She has more recently been diagnosed with c-spine issues that causes headaches and severe N&V.  Patient reports C4-7 disc protrusion.  She denies symptoms associated with claudication, denies non healing wounds.  She has recurrent migraine headaches, has photosensitivity.  In her office note from March 2015 Dr. Terrace Arabia notes patient's left pupil is smaller than the right, pupils are equal today.   Has Ehlers-Danlus syndrome (excessive ROM of her joints).  Has had labile blood pressure that she states has stabilized with medication adjustment. She denies steal symptoms in her upper extremities but does have tingling in left fingers that she attributes to C-spine issues.   Pt Diabetic: Yes, states last A1C was 7.?, not improved from prior A1C of 8.4 Pt smoker: smoker (1/2 ppd x since age 68 yrs)  Pt meds include: Statin : Yes, Crestor dose increased per pt, myalgias in thighs then worsened ASA: Yes Other anticoagulants/antiplatelets: no   Past Medical History:  Diagnosis Date  . Bronchitis Jan 2014   became pneumonia  . Carotid bruit   . Coronary artery disease   . DDD (degenerative disc disease), cervical   .  Diabetes mellitus   . Dizziness   . Headache   . Hyperlipidemia   . Hypertension   . IHD (ischemic heart disease)   . Migraine headache    Severe Posterior Subluxation of C6  over C7 and C5 over C6 w/broad based disc protusion at C5-6  resulting in canal  narrowing  . PAOD (peripheral arterial occlusive disease) (HCC)    s/p AoBifem bypass & L Carotid-Subclavian Bypass  . Persistent headaches   . Pneumonia Jan. 2014  . PVD (peripheral vascular disease) (HCC)   . Stroke [facility])     Social History Social History  Substance Use Topics  . Smoking status: Current Every Day Smoker    Packs/day: 0.50    Years: 43.00    Types: Cigarettes  . Smokeless tobacco: Never Used  . Alcohol use No    Family History Family History  Problem Relation Age of Onset  . Cancer Mother        Tiffany Kocher  . Cancer Father        Prostate  . Heart attack Brother 46       Cardiac Infraction  . Heart attack Paternal Grandfather     Surgical History Past Surgical History:  Procedure Laterality Date  . AORTO-FEMORAL BYPASS GRAFT  02/2001   Dr. Hart Rochester.   Ao-BiFem wioth L Renal A bypass.  Marland Kitchen CARDIAC CATHETERIZATION  04/28/2001  . CARDIOVASCULAR STRESS TEST  11/23/2006   EF 64%  . CAROTID-SUBCLAVIAN BYPASS GRAFT Left 2003   Dr. Hart Rochester  . CATARACT EXTRACTION Right 09/2000  . CORONARY ARTERY BYPASS GRAFT  05/03/2001  Dr. Cornelius Moras; x 4.  free RIMA-LAD, RIMA-RCA, SVG-D1, SVG-OM1  . US ECHOCARDIOGRAPHY  07/16/2004   EF 55-60%    Allergies  Allergen Reactions  . Erythromycin Nausea And Vomiting and Swelling    Throw up  . Lipitor [Atorvastatin Calcium] Other (See Comments)    Muscle cramps in legs    Current Outpatient Prescriptions  Medication Sig Dispense Refill  . aspirin 81 MG EC tablet Take 81 mg by mouth daily.      Marland Kitchen glipiZIDE (GLUCOTROL) 10 MG tablet Take 10 mg by mouth daily before breakfast. TAKE 1 AND 1/2 TABLETS (15 MG) IN THE MORNRING AND 1 TABLET (10 MG) AT NIGHT.    Marland Kitchen lisinopril  (PRINIVIL,ZESTRIL) 20 MG tablet Take 10 mg by mouth 2 (two) times daily.    . metFORMIN (GLUCOPHAGE) 1000 MG tablet Take 1,000 mg by mouth. TAKE 1 TABLET IN THE MORNING AND TAKE 1 AND 1/2 AT NIGHT.    Marland Kitchen metoprolol (LOPRESSOR) 50 MG tablet 1/2 TWICE A DAY 90 tablet 3  . oxyCODONE-acetaminophen (PERCOCET) 5-325 MG tablet Take 1-2 tablets by mouth every 4 (four) hours as needed. 15 tablet 0  . PARoxetine (PAXIL) 10 MG tablet Take 10 mg by mouth daily.    . rosuvastatin (CRESTOR) 10 MG tablet TAKE ONE TABLET BY MOUTH ONCE DAILY AFTER  SUPPER 90 tablet 3  . ondansetron (ZOFRAN) 8 MG tablet Take 1 tablet (8 mg total) by mouth 3 (three) times daily as needed for nausea or vomiting. (Patient not taking: Reported on 04/29/2017) 10 tablet 0   No current facility-administered medications for this visit.     Review of Systems : See HPI for pertinent positives and negatives.  Physical Examination  Vitals:   04/29/17 0853 04/29/17 0856 04/29/17 0857  BP: (!) 142/83 137/71 (!) 146/77  Pulse: (!) 53 (!) 58 (!) 58  Resp: 14    Temp: (!) 97.5 F (36.4 C)    SpO2: 98%    Weight: 126 lb (57.2 kg)    Height: 5\' 5"  (1.651 m)     Body mass index is 20.97 kg/m.  General: WDWN thin female in NAD GAIT:normal Eyes: PERRLA Pulmonary: Respirations are non-labored, diminished air movement in all fields, no rales, rhonchi, or wheezing.  Cardiac: regular rhythm, no detected murmur.  VASCULAR EXAM Carotid Bruits Left Right   Positive positive    Abdominal aortic pulse is not palpable. Radial pulses are 2+ palpable and equal.      LE Pulses LEFT RIGHT   FEMORAL  palpable  palpable    POPLITEAL not palpable  not palpable   POSTERIOR TIBIAL  palpable   palpable    DORSALIS PEDIS  ANTERIOR TIBIAL  palpable  palpable     Gastrointestinal: soft, nontender, BS WNL, no r/g, no palpable masses.  Musculoskeletal: No muscle atrophy/wasting. M/S 5/5 throughout, Extremities without ischemic changes  Neurologic: A&O X 3; appropriate affect, speech is normal, CN 2-12 intact except left pupil remains smaller than right, Pain and light touch intact in extremities, Motor exam as listed above.   Assessment: Danielle Farrell is a 61 y.o. female who is s/p left carotid subclavian anastomosis in 2003 for left arm claudication which remains asymptomatic. She also has no lower extremity claudication having undergone aortobifemoral bypass graft by Dr. Hart Rochester in 2002. There are no signs of ischemia in her feet or legs, her pedal pulses are palpable.  She denies steal sx's in her upper extremities.  She has a history of  a remote stroke with no residual deficits, and states this may have been a migraine headache.   Her prominent atherosclerotic risk factors remain almost in control DM and continued smoking, see Plan.   DATA  Carotid Duplex (04/29/17): Right ICA: 40%-59% stenosis Left ICA:  1-39% stenosis.  Patent left subclavian/common carotid artery transposition. Right vertebral artery flow is antegrade, left is antegrade/resistant.   Bilateral subclavian artery waveforms are normal.  Category of disease involving the right ICA has downgraded (appears to be less stenotic) compared to the last exam on 10-28-16.   (10-28-16) Bilateral ABI and TBI remain normal with all triphasic waveforms.    Plan:  The patient was counseled re smoking cessation and given several free resources re smoking cessation. I advised her to work closely with her medical provider that helps her manage her DM to get her DM under as good control as possible to reduce her risk of stroke, MI, loss of perfusion to her legs again, et al. Graduated walking program discussed and how to acheive.   Follow-up in 6  months with Carotid Duplex scan and ABI's.  I discussed in depth with the patient the nature of atherosclerosis, and emphasized the importance of maximal medical management including strict control of blood pressure, blood glucose, and lipid levels, obtaining regular exercise, and cessation of smoking.  The patient is aware that without maximal medical management the underlying atherosclerotic disease process will progress, limiting the benefit of any interventions. The patient was given information about stroke prevention and what symptoms should prompt the patient to seek immediate medical care. Thank you for allowing us to participate in this patient's care.  Charisse MarchSuzanne Brodi Kari, RN, MSN, FNP-C [facility] Office: [phone]  Clinic Physician: Chen/Cain  04/29/17 9:00 AM

## 2017-04-29 NOTE — Patient Instructions (Signed)
Steps to Quit Smoking Smoking tobacco can be bad for your health. It can also affect almost every organ in your body. Smoking puts you and people around you at risk for many serious long-lasting (chronic) diseases. Quitting smoking is hard, but it is one of the best things that you can do for your health. It is never too late to quit. What are the benefits of quitting smoking? When you quit smoking, you lower your risk for getting serious diseases and conditions. They can include:  Lung cancer or lung disease.  Heart disease.  Stroke.  Heart attack.  Not being able to have children (infertility).  Weak bones (osteoporosis) and broken bones (fractures).  If you have coughing, wheezing, and shortness of breath, those symptoms may get better when you quit. You may also get sick less often. If you are pregnant, quitting smoking can help to lower your chances of having a baby of low birth weight. What can I do to help me quit smoking? Talk with your doctor about what can help you quit smoking. Some things you can do (strategies) include:  Quitting smoking totally, instead of slowly cutting back how much you smoke over a period of time.  Going to in-person counseling. You are more likely to quit if you go to many counseling sessions.  Using resources and support systems, such as: ? Online chats with a counselor. ? Phone quitlines. ? Printed self-help materials. ? Support groups or group counseling. ? Text messaging programs. ? Mobile phone apps or applications.  Taking medicines. Some of these medicines may have nicotine in them. If you are pregnant or breastfeeding, do not take any medicines to quit smoking unless your doctor says it is okay. Talk with your doctor about counseling or other things that can help you.  Talk with your doctor about using more than one strategy at the same time, such as taking medicines while you are also going to in-person counseling. This can help make  quitting easier. What things can I do to make it easier to quit? Quitting smoking might feel very hard at first, but there is a lot that you can do to make it easier. Take these steps:  Talk to your family and friends. Ask them to support and encourage you.  Call phone quitlines, reach out to support groups, or work with a counselor.  Ask people who smoke to not smoke around you.  Avoid places that make you want (trigger) to smoke, such as: ? Bars. ? Parties. ? Smoke-break areas at work.  Spend time with people who do not smoke.  Lower the stress in your life. Stress can make you want to smoke. Try these things to help your stress: ? Getting regular exercise. ? Deep-breathing exercises. ? Yoga. ? Meditating. ? Doing a body scan. To do this, close your eyes, focus on one area of your body at a time from head to toe, and notice which parts of your body are tense. Try to relax the muscles in those areas.  Download or buy apps on your mobile phone or tablet that can help you stick to your quit plan. There are many free apps, such as QuitGuide from the CDC (Centers for Disease Control and Prevention). You can find more support from smokefree.gov and other websites.  This information is not intended to replace advice given to you by your health care provider. Make sure you discuss any questions you have with your health care provider. Document Released: 04/17/2009 Document   Revised: 02/17/2016 Document Reviewed: 11/05/2014 Elsevier Interactive Patient Education  2018 Elsevier Inc.     Stroke Prevention Some medical conditions and behaviors are associated with an increased chance of having a stroke. You may prevent a stroke by making healthy choices and managing medical conditions. How can I reduce my risk of having a stroke?  Stay physically active. Get at least 30 minutes of activity on most or all days.  Do not smoke. It may also be helpful to avoid exposure to secondhand  smoke.  Limit alcohol use. Moderate alcohol use is considered to be: ? No more than 2 drinks per day for men. ? No more than 1 drink per day for nonpregnant women.  Eat healthy foods. This involves: ? Eating 5 or more servings of fruits and vegetables a day. ? Making dietary changes that address high blood pressure (hypertension), high cholesterol, diabetes, or obesity.  Manage your cholesterol levels. ? Making food choices that are high in fiber and low in saturated fat, trans fat, and cholesterol may control cholesterol levels. ? Take any prescribed medicines to control cholesterol as directed by your health care provider.  Manage your diabetes. ? Controlling your carbohydrate and sugar intake is recommended to manage diabetes. ? Take any prescribed medicines to control diabetes as directed by your health care provider.  Control your hypertension. ? Making food choices that are low in salt (sodium), saturated fat, trans fat, and cholesterol is recommended to manage hypertension. ? Ask your health care provider if you need treatment to lower your blood pressure. Take any prescribed medicines to control hypertension as directed by your health care provider. ? If you are 18-39 years of age, have your blood pressure checked every 3-5 years. If you are 40 years of age or older, have your blood pressure checked every year.  Maintain a healthy weight. ? Reducing calorie intake and making food choices that are low in sodium, saturated fat, trans fat, and cholesterol are recommended to manage weight.  Stop drug abuse.  Avoid taking birth control pills. ? Talk to your health care provider about the risks of taking birth control pills if you are over 35 years old, smoke, get migraines, or have ever had a blood clot.  Get evaluated for sleep disorders (sleep apnea). ? Talk to your health care provider about getting a sleep evaluation if you snore a lot or have excessive sleepiness.  Take  medicines only as directed by your health care provider. ? For some people, aspirin or blood thinners (anticoagulants) are helpful in reducing the risk of forming abnormal blood clots that can lead to stroke. If you have the irregular heart rhythm of atrial fibrillation, you should be on a blood thinner unless there is a good reason you cannot take them. ? Understand all your medicine instructions.  Make sure that other conditions (such as anemia or atherosclerosis) are addressed. Get help right away if:  You have sudden weakness or numbness of the face, arm, or leg, especially on one side of the body.  Your face or eyelid droops to one side.  You have sudden confusion.  You have trouble speaking (aphasia) or understanding.  You have sudden trouble seeing in one or both eyes.  You have sudden trouble walking.  You have dizziness.  You have a loss of balance or coordination.  You have a sudden, severe headache with no known cause.  You have new chest pain or an irregular heartbeat. Any of these symptoms may represent   a serious problem that is an emergency. Do not wait to see if the symptoms will go away. Get medical help at once. Call your local emergency services (911 in [place] Do not drive yourself to the hospital. This information is not intended to replace advice given to you by your health care provider. Make sure you discuss any questions you have with your health care provider. Document Released: 07/29/2004 Document Revised: 11/27/2015 Document Reviewed: 12/22/2012 Elsevier Interactive Patient Education  2017 Elsevier Inc.     Peripheral Vascular Disease Peripheral vascular disease (PVD) is a disease of the blood vessels that are not part of your heart and brain. A simple term for PVD is poor circulation. In most cases, PVD narrows the blood vessels that carry blood from your heart to the rest of your body. This can result in a decreased supply of blood to your arms, legs,  and internal organs, like your stomach or kidneys. However, it most often affects a person's lower legs and feet. There are two types of PVD.  Organic PVD. This is the more common type. It is caused by damage to the structure of blood vessels.  Functional PVD. This is caused by conditions that make blood vessels contract and tighten (spasm).  Without treatment, PVD tends to get worse over time. PVD can also lead to acute ischemic limb. This is when an arm or limb suddenly has trouble getting enough blood. This is a medical emergency. Follow these instructions at home:  Take medicines only as told by your doctor.  Do not use any tobacco products, including cigarettes, chewing tobacco, or electronic cigarettes. If you need help quitting, ask your doctor.  Lose weight if you are overweight, and maintain a healthy weight as told by your doctor.  Eat a diet that is low in fat and cholesterol. If you need help, ask your doctor.  Exercise regularly. Ask your doctor for some good activities for you.  Take good care of your feet. ? Wear comfortable shoes that fit well. ? Check your feet often for any cuts or sores. Contact a doctor if:  You have cramps in your legs while walking.  You have leg pain when you are at rest.  You have coldness in a leg or foot.  Your skin changes.  You are unable to get or have an erection (erectile dysfunction).  You have cuts or sores on your feet that are not healing. Get help right away if:  Your arm or leg turns cold and blue.  Your arms or legs become red, warm, swollen, painful, or numb.  You have chest pain or trouble breathing.  You suddenly have weakness in your face, arm, or leg.  You become very confused or you cannot speak.  You suddenly have a very bad headache.  You suddenly cannot see. This information is not intended to replace advice given to you by your health care provider. Make sure you discuss any questions you have with  your health care provider. Document Released: 09/15/2009 Document Revised: 11/27/2015 Document Reviewed: 11/29/2013 Elsevier Interactive Patient Education  2017 Elsevier Inc.  

## 2017-05-11 NOTE — Addendum Note (Signed)
Addended by: Burton ApleyPETTY, Ashonte Angelucci A on: 05/11/2017 04:37 PM   Modules accepted: Orders

## 2017-10-28 ENCOUNTER — Ambulatory Visit (INDEPENDENT_AMBULATORY_CARE_PROVIDER_SITE_OTHER): Payer: Medicare Other | Admitting: Family

## 2017-10-28 ENCOUNTER — Encounter: Payer: Self-pay | Admitting: Family

## 2017-10-28 ENCOUNTER — Ambulatory Visit (INDEPENDENT_AMBULATORY_CARE_PROVIDER_SITE_OTHER)
Admission: RE | Admit: 2017-10-28 | Discharge: 2017-10-28 | Disposition: A | Discharge status: Home / Self Care | Payer: Medicare Other | Source: Ambulatory Visit | Attending: Family | Admitting: Family

## 2017-10-28 ENCOUNTER — Ambulatory Visit (HOSPITAL_COMMUNITY)
Admission: RE | Admit: 2017-10-28 | Discharge: 2017-10-28 | Disposition: A | Discharge status: Home / Self Care | Payer: Medicare Other | Source: Ambulatory Visit | Attending: Family | Admitting: Family

## 2017-10-28 VITALS — BP 124/73 | HR 58 | Temp 97.4°F | Resp 18 | Ht 65.0 in | Wt 122.2 lb

## 2017-10-28 DIAGNOSIS — I771 Stricture of artery: Secondary | ICD-10-CM

## 2017-10-28 DIAGNOSIS — Z95828 Presence of other vascular implants and grafts: Secondary | ICD-10-CM

## 2017-10-28 DIAGNOSIS — I7409 Other arterial embolism and thrombosis of abdominal aorta: Secondary | ICD-10-CM

## 2017-10-28 DIAGNOSIS — I6523 Occlusion and stenosis of bilateral carotid arteries: Secondary | ICD-10-CM

## 2017-10-28 DIAGNOSIS — E1151 Type 2 diabetes mellitus with diabetic peripheral angiopathy without gangrene: Secondary | ICD-10-CM

## 2017-10-28 DIAGNOSIS — F17200 Nicotine dependence, unspecified, uncomplicated: Secondary | ICD-10-CM

## 2017-10-28 NOTE — Progress Notes (Signed)
[facility] [place] HISTORY AND PHYSICAL   CC: Follow up extracranial carotid artery stenosis and peripheral artery occlusive disease    History of Present Illness:   Danielle Farrell is a 62 y.o. female who is s/p left carotid subclavian anastomosis in 2003 by Dr. Hart RochesterLawson for left arm claudication which remains asymptomatic. She also has no lower extremity claudication having undergone aortobifemoral bypass graft by Dr. Hart RochesterLawson in 2002.   She had coronary artery bypass grafting in the past and denies any active cardiac symptoms.  Has had a stoke a long time ago which she does not remember experiencing.  She states her symptoms were due to migraine headaches with left eye sight abnormalities, denies hemiparesis. She states the vision in her left eye has improved greatly since her cataract surgery.   She has more recently been diagnosed with c-spine issues that causes headaches and severe N&V.  Patient reports C4-7 disc protrusion.  She denies symptoms associated with claudication, denies non healing wounds.  She has recurrent migraine headaches, has photosensitivity.  In her office note from March 2015 Dr. Terrace ArabiaYan notes patient's left pupil is smaller than the right.  Has Ehlers-Danlus syndrome (excessive ROM of her joints).  Has had labile blood pressure that she states has stabilized with medication adjustment. She denies steal symptoms in her upper extremitiesbut does have tingling in left fingers that she attributes to C-spine issues.   Pt Diabetic: Yes, states last A1C was 8.? Pt smoker: smoker (1/2 ppd x since age 62 yrs)  Pt meds include: Statin : Yes, Crestor dose increased per pt, myalgias in thighs then worsened ASA: Yes Other anticoagulants/antiplatelets: no    Current Outpatient Medications  Medication Sig Dispense Refill  . aspirin 81 MG EC tablet Take 81 mg by mouth daily.      Marland Kitchen. glipiZIDE (GLUCOTROL) 10 MG tablet Take 10 mg by mouth  daily before breakfast. TAKE 1 AND 1/2 TABLETS (15 MG) IN THE MORNRING AND 1 TABLET (10 MG) AT NIGHT.    Marland Kitchen. lisinopril (PRINIVIL,ZESTRIL) 20 MG tablet Take 10 mg by mouth 2 (two) times daily.    . metFORMIN (GLUCOPHAGE) 1000 MG tablet Take 1,000 mg by mouth. TAKE 1 TABLET IN THE MORNING AND TAKE 1 AND 1/2 AT NIGHT.    Marland Kitchen. oxyCODONE-acetaminophen (PERCOCET) 5-325 MG tablet Take 1-2 tablets by mouth every 4 (four) hours as needed. 15 tablet 0  . PARoxetine (PAXIL) 10 MG tablet Take 10 mg by mouth daily.    . rosuvastatin (CRESTOR) 10 MG tablet TAKE ONE TABLET BY MOUTH ONCE DAILY AFTER  SUPPER 90 tablet 3  . metoprolol (LOPRESSOR) 50 MG tablet 1/2 TWICE A DAY 90 tablet 3   No current facility-administered medications for this visit.     Past Medical History:  Diagnosis Date  . Bronchitis Jan 2014   became pneumonia  . Carotid bruit   . Coronary artery disease   . DDD (degenerative disc disease), cervical   . Diabetes mellitus   . Dizziness   . Headache   . Hyperlipidemia   . Hypertension   . IHD (ischemic heart disease)   . Migraine headache    Severe Posterior Subluxation of C6  over C7 and C5 over C6 w/broad based disc protusion at C5-6  resulting in canal  narrowing  . PAOD (peripheral arterial occlusive disease) (HCC)    s/p AoBifem bypass & L Carotid-Subclavian Bypass  . Persistent headaches   . Pneumonia Jan. 2014  .  PVD (peripheral vascular disease) (HCC)   . Stroke [facility])     Social History Social History   Tobacco Use  . Smoking status: Current Every Day Smoker    Packs/day: 0.50    Years: 43.00    Pack years: 21.50    Types: Cigarettes  . Smokeless tobacco: Never Used  Substance Use Topics  . Alcohol use: No    Alcohol/week: 0.0 oz  . Drug use: No    Family History Family History  Problem Relation Age of Onset  . Cancer Mother        Tiffany Kocher  . Cancer Father        Prostate  . Heart attack Brother 46       Cardiac Infraction  . Heart attack Paternal  Grandfather     Surgical History Past Surgical History:  Procedure Laterality Date  . AORTO-FEMORAL BYPASS GRAFT  02/2001   Dr. Hart Rochester.   Ao-BiFem wioth L Renal A bypass.  Marland Kitchen CARDIAC CATHETERIZATION  04/28/2001  . CARDIOVASCULAR STRESS TEST  11/23/2006   EF 64%  . CAROTID-SUBCLAVIAN BYPASS GRAFT Left 2003   Dr. Hart Rochester  . CATARACT EXTRACTION Right 09/2000  . CORONARY ARTERY BYPASS GRAFT  05/03/2001   Dr. Cornelius Moras; x 4.  free RIMA-LAD, RIMA-RCA, SVG-D1, SVG-OM1  . US ECHOCARDIOGRAPHY  07/16/2004   EF 55-60%    Allergies  Allergen Reactions  . Erythromycin Nausea And Vomiting and Swelling    Throw up  . Farxiga [Dapagliflozin] Hives  . Lipitor [Atorvastatin Calcium] Other (See Comments)    Muscle cramps in legs    Current Outpatient Medications  Medication Sig Dispense Refill  . aspirin 81 MG EC tablet Take 81 mg by mouth daily.      Marland Kitchen glipiZIDE (GLUCOTROL) 10 MG tablet Take 10 mg by mouth daily before breakfast. TAKE 1 AND 1/2 TABLETS (15 MG) IN THE MORNRING AND 1 TABLET (10 MG) AT NIGHT.    Marland Kitchen lisinopril (PRINIVIL,ZESTRIL) 20 MG tablet Take 10 mg by mouth 2 (two) times daily.    . metFORMIN (GLUCOPHAGE) 1000 MG tablet Take 1,000 mg by mouth. TAKE 1 TABLET IN THE MORNING AND TAKE 1 AND 1/2 AT NIGHT.    Marland Kitchen oxyCODONE-acetaminophen (PERCOCET) 5-325 MG tablet Take 1-2 tablets by mouth every 4 (four) hours as needed. 15 tablet 0  . PARoxetine (PAXIL) 10 MG tablet Take 10 mg by mouth daily.    . rosuvastatin (CRESTOR) 10 MG tablet TAKE ONE TABLET BY MOUTH ONCE DAILY AFTER  SUPPER 90 tablet 3  . metoprolol (LOPRESSOR) 50 MG tablet 1/2 TWICE A DAY 90 tablet 3   No current facility-administered medications for this visit.      REVIEW OF SYSTEMS: See HPI for pertinent positives and negatives.  Physical Examination Vitals:   10/28/17 0920 10/28/17 0922  BP: 115/72 124/73  Pulse: (!) 56 (!) 58  Resp: 18   Temp: (!) 97.4 F (36.3 C)   TempSrc: Oral   SpO2: 97% 98%  Weight: 122 lb 3.2  oz (55.4 kg)   Height: 5\' 5"  (1.651 m)    Body mass index is 20.34 kg/m.  General:  WD thin female in NAD Gait: Normal HENT: No gross abnormalities  Eyes: Left pupil is smaller than right, pupils are round and respond to light Pulmonary: Respirations are non-labored, diminished air movement in all fields, no rales,rhonchi,or wheezing.  Cardiac: Regular rhythm, bradycardic on a beta blocker, no murmur detected Abdomen: soft, NT, no masses palpated Skin: no rashes,  no ulcers, no cellulitis.   VASCULAR EXAM  Carotid Bruits Right Left   Positive Positive      Radial pulses are 2+ palpable bilaterally   Adominal aortic pulse is not palpable                      VASCULAR EXAM: Extremities without ischemic changes,  without Gangrene; without open wounds.                                                                                                          LE Pulses Right Left       FEMORAL   palpable   palpable        POPLITEAL  not palpable   not palpable       POSTERIOR TIBIAL   palpable    palpable        DORSALIS PEDIS      ANTERIOR TIBIAL  palpable  palpable     Musculoskeletal: no muscle wasting or atrophy; no peripheral edema  Neurologic:  A&O X 3; appropriate affect, sensation is normal; speech is normal, CN 2-12 intact except left pupil remains smaller than right, pain and light touch intact in extremities, motor exam as listed above. Psychiatric: Normal thought content, mood appropriate to clinical situation.     ASSESSMENT:  Danielle Farrell is a 62 y.o. female who is s/p left carotid subclavian anastomosis in 2003 for left arm claudication which remains asymptomatic. She also has no lower extremity claudication having undergone aortobifemoral bypass graft by Dr. Hart Rochester in 2002. There are no signs of ischemia in her feet or legs, her pedal pulses are palpable.  She denies steal sx's in her upper extremities.  She has a history of a remote stroke with no  residual deficits, and states this may have been a migraine headache.   Her prominent atherosclerotic risk factors remain uncontrolled DM and continued smoking, see Plan.   DATA  Carotid Duplex (10/28/17): Right ICA: 40-59% stenosis Left ICA: 1-39% stenosis. Increased velocity in the proximal CCA may be due to carotid/subclavian transposition which appears patent.  The left vertebral artery is noted to be abnormal, suggestive of more proximal disease.  Right vertebral artery flow is antegrade. Bilateral subclavian artery waveforms are normal.   No significant change compared to the exam on 04-29-17.     ABI (Date: 10/28/2017):  R:   ABI: 1.11 (was 1.14 on 10-28-16),   PT: tri  DP: tri  TBI:  0.72 (was 0.76)  L:   ABI: 1.07 (was 1.20),   PT: tri  DP: tri  TBI: 0.75 (was 0.74)  Bilateral ABI remain normal with all triphasic waveforms. Bilateral TBI remain normal.     Plan:  The patient was counseled re smoking cessation and given several free resources re smoking cessation.  I advised her to work closely with her medical provider that helps her manage her DM to get her DM under as good control as possible to reduce her risk of stroke, MI, loss of perfusion to  her legs again, et al. Graduated walking program discussed and how to acheive.  Follow-up in 1 yearwith Carotid Duplex scan andABI's.    I discussed in depth with the patient the nature of atherosclerosis, and emphasized the importance of maximal medical management including strict control of blood pressure, blood glucose, and lipid levels, obtaining regular exercise, and cessation of smoking.  The patient is aware that without maximal medical management the underlying atherosclerotic disease process will progress, limiting the benefit of any interventions.  The patient was given information about stroke prevention and what symptoms should prompt the patient to seek immediate medical care.  The  patient was given information about PAD including signs, symptoms, treatment, what symptoms should prompt the patient to seek immediate medical care, and risk reduction measures to take.  Thank you for allowing [place] to participate in this patient's care.  Charisse March, RN, MSN, FNP-C [facility] Office: [phone]  Clinic MD: Randie Heinz 10/28/2017 9:47 AM

## 2017-10-28 NOTE — Patient Instructions (Signed)
Steps to Quit Smoking Smoking tobacco can be bad for your health. It can also affect almost every organ in your body. Smoking puts you and people around you at risk for many serious long-lasting (chronic) diseases. Quitting smoking is hard, but it is one of the best things that you can do for your health. It is never too late to quit. What are the benefits of quitting smoking? When you quit smoking, you lower your risk for getting serious diseases and conditions. They can include:  Lung cancer or lung disease.  Heart disease.  Stroke.  Heart attack.  Not being able to have children (infertility).  Weak bones (osteoporosis) and broken bones (fractures).  If you have coughing, wheezing, and shortness of breath, those symptoms may get better when you quit. You may also get sick less often. If you are pregnant, quitting smoking can help to lower your chances of having a baby of low birth weight. What can I do to help me quit smoking? Talk with your doctor about what can help you quit smoking. Some things you can do (strategies) include:  Quitting smoking totally, instead of slowly cutting back how much you smoke over a period of time.  Going to in-person counseling. You are more likely to quit if you go to many counseling sessions.  Using resources and support systems, such as: ? Online chats with a counselor. ? Phone quitlines. ? Printed self-help materials. ? Support groups or group counseling. ? Text messaging programs. ? Mobile phone apps or applications.  Taking medicines. Some of these medicines may have nicotine in them. If you are pregnant or breastfeeding, do not take any medicines to quit smoking unless your doctor says it is okay. Talk with your doctor about counseling or other things that can help you.  Talk with your doctor about using more than one strategy at the same time, such as taking medicines while you are also going to in-person counseling. This can help make  quitting easier. What things can I do to make it easier to quit? Quitting smoking might feel very hard at first, but there is a lot that you can do to make it easier. Take these steps:  Talk to your family and friends. Ask them to support and encourage you.  Call phone quitlines, reach out to support groups, or work with a counselor.  Ask people who smoke to not smoke around you.  Avoid places that make you want (trigger) to smoke, such as: ? Bars. ? Parties. ? Smoke-break areas at work.  Spend time with people who do not smoke.  Lower the stress in your life. Stress can make you want to smoke. Try these things to help your stress: ? Getting regular exercise. ? Deep-breathing exercises. ? Yoga. ? Meditating. ? Doing a body scan. To do this, close your eyes, focus on one area of your body at a time from head to toe, and notice which parts of your body are tense. Try to relax the muscles in those areas.  Download or buy apps on your mobile phone or tablet that can help you stick to your quit plan. There are many free apps, such as QuitGuide from the CDC (Centers for Disease Control and Prevention). You can find more support from smokefree.gov and other websites.  This information is not intended to replace advice given to you by your health care provider. Make sure you discuss any questions you have with your health care provider. Document Released: 04/17/2009 Document   Revised: 02/17/2016 Document Reviewed: 11/05/2014 Elsevier Interactive Patient Education  2018 [employer] Inc.   Stroke Prevention Some health problems and behaviors may make it more likely for you to have a stroke. Below are ways to lessen your risk of having a stroke.  Be active for at least 30 minutes on most or all days.  Do not smoke. Try not to be around others who smoke.  Do not drink too much alcohol. ? Do not have more than 2 drinks a day if you are a man. ? Do not have more than 1 drink a day if you are a  woman and are not pregnant.  Eat healthy foods, such as fruits and vegetables. If you were put on a specific diet, follow the diet as told.  Keep your cholesterol levels under control through diet and medicines. Look for foods that are low in saturated fat, trans fat, cholesterol, and are high in fiber.  If you have diabetes, follow all diet plans and take your medicine as told.  Ask your doctor if you need treatment to lower your blood pressure. If you have high blood pressure (hypertension), follow all diet plans and take your medicine as told by your doctor.  If you are 18-39 years old, have your blood pressure checked every 3-5 years. If you are age 40 or older, have your blood pressure checked every year.  Keep a healthy weight. Eat foods that are low in calories, salt, saturated fat, trans fat, and cholesterol.  Do not take drugs.  Avoid birth control pills, if this applies. Talk to your doctor about the risks of taking birth control pills.  Talk to your doctor if you have sleep problems (sleep apnea).  Take all medicine as told by your doctor. ? You may be told to take aspirin or blood thinner medicine. Take this medicine as told by your doctor. ? Understand your medicine instructions.  Make sure any other conditions you have are being taken care of.  Get help right away if:  You suddenly lose feeling (you feel numb) or have weakness in your face, arm, or leg.  Your face or eyelid hangs down to one side.  You suddenly feel confused.  You have trouble talking (aphasia) or understanding what people are saying.  You suddenly have trouble seeing in one or both eyes.  You suddenly have trouble walking.  You are dizzy.  You lose your balance or your movements are clumsy (uncoordinated).  You suddenly have a very bad headache and you do not know the cause.  You have new chest pain.  Your heart feels like it is fluttering or skipping a beat (irregular heartbeat). Do  not wait to see if the symptoms above go away. Get help right away. Call your local emergency services (911 in [place] Do not drive yourself to the hospital. This information is not intended to replace advice given to you by your health care provider. Make sure you discuss any questions you have with your health care provider. Document Released: 12/21/2011 Document Revised: 11/27/2015 Document Reviewed: 12/22/2012 Elsevier Interactive Patient Education  2018 Elsevier Inc.     Peripheral Vascular Disease Peripheral vascular disease (PVD) is a disease of the blood vessels that are not part of your heart and brain. A simple term for PVD is poor circulation. In most cases, PVD narrows the blood vessels that carry blood from your heart to the rest of your body. This can result in a decreased supply of blood to   your arms, legs, and internal organs, like your stomach or kidneys. However, it most often affects a person's lower legs and feet. There are two types of PVD.  Organic PVD. This is the more common type. It is caused by damage to the structure of blood vessels.  Functional PVD. This is caused by conditions that make blood vessels contract and tighten (spasm).  Without treatment, PVD tends to get worse over time. PVD can also lead to acute ischemic limb. This is when an arm or limb suddenly has trouble getting enough blood. This is a medical emergency. Follow these instructions at home:  Take medicines only as told by your doctor.  Do not use any tobacco products, including cigarettes, chewing tobacco, or electronic cigarettes. If you need help quitting, ask your doctor.  Lose weight if you are overweight, and maintain a healthy weight as told by your doctor.  Eat a diet that is low in fat and cholesterol. If you need help, ask your doctor.  Exercise regularly. Ask your doctor for some good activities for you.  Take good care of your feet. ? Wear comfortable shoes that fit  well. ? Check your feet often for any cuts or sores. Contact a doctor if:  You have cramps in your legs while walking.  You have leg pain when you are at rest.  You have coldness in a leg or foot.  Your skin changes.  You are unable to get or have an erection (erectile dysfunction).  You have cuts or sores on your feet that are not healing. Get help right away if:  Your arm or leg turns cold and blue.  Your arms or legs become red, warm, swollen, painful, or numb.  You have chest pain or trouble breathing.  You suddenly have weakness in your face, arm, or leg.  You become very confused or you cannot speak.  You suddenly have a very bad headache.  You suddenly cannot see. This information is not intended to replace advice given to you by your health care provider. Make sure you discuss any questions you have with your health care provider. Document Released: 09/15/2009 Document Revised: 11/27/2015 Document Reviewed: 11/29/2013 Elsevier Interactive Patient Education  2017 [employer] Inc.  

## 2019-04-05 ENCOUNTER — Other Ambulatory Visit: Payer: Self-pay

## 2019-04-05 DIAGNOSIS — I771 Stricture of artery: Secondary | ICD-10-CM

## 2019-04-05 DIAGNOSIS — I25119 Atherosclerotic heart disease of native coronary artery with unspecified angina pectoris: Secondary | ICD-10-CM

## 2019-04-05 DIAGNOSIS — I6523 Occlusion and stenosis of bilateral carotid arteries: Secondary | ICD-10-CM

## 2019-04-06 ENCOUNTER — Other Ambulatory Visit: Payer: Self-pay

## 2019-04-06 ENCOUNTER — Encounter (HOSPITAL_COMMUNITY): Payer: Self-pay

## 2019-04-06 ENCOUNTER — Emergency Department (HOSPITAL_COMMUNITY)
Admission: EM | Admit: 2019-04-06 | Discharge: 2019-04-06 | Disposition: A | Discharge status: Home / Self Care | Payer: Medicare Other | Attending: Emergency Medicine | Admitting: Emergency Medicine

## 2019-04-06 ENCOUNTER — Emergency Department (HOSPITAL_COMMUNITY): Payer: Medicare Other

## 2019-04-06 DIAGNOSIS — Z951 Presence of aortocoronary bypass graft: Secondary | ICD-10-CM | POA: Insufficient documentation

## 2019-04-06 DIAGNOSIS — R519 Headache, unspecified: Secondary | ICD-10-CM | POA: Insufficient documentation

## 2019-04-06 DIAGNOSIS — Y939 Activity, unspecified: Secondary | ICD-10-CM | POA: Insufficient documentation

## 2019-04-06 DIAGNOSIS — M25551 Pain in right hip: Secondary | ICD-10-CM | POA: Insufficient documentation

## 2019-04-06 DIAGNOSIS — Y999 Unspecified external cause status: Secondary | ICD-10-CM | POA: Insufficient documentation

## 2019-04-06 DIAGNOSIS — S3992XA Unspecified injury of lower back, initial encounter: Secondary | ICD-10-CM | POA: Diagnosis present

## 2019-04-06 DIAGNOSIS — F17210 Nicotine dependence, cigarettes, uncomplicated: Secondary | ICD-10-CM | POA: Insufficient documentation

## 2019-04-06 DIAGNOSIS — E119 Type 2 diabetes mellitus without complications: Secondary | ICD-10-CM | POA: Diagnosis not present

## 2019-04-06 DIAGNOSIS — I10 Essential (primary) hypertension: Secondary | ICD-10-CM | POA: Insufficient documentation

## 2019-04-06 DIAGNOSIS — S32010A Wedge compression fracture of first lumbar vertebra, initial encounter for closed fracture: Secondary | ICD-10-CM | POA: Diagnosis not present

## 2019-04-06 DIAGNOSIS — M542 Cervicalgia: Secondary | ICD-10-CM | POA: Diagnosis not present

## 2019-04-06 DIAGNOSIS — Z7984 Long term (current) use of oral hypoglycemic drugs: Secondary | ICD-10-CM | POA: Diagnosis not present

## 2019-04-06 DIAGNOSIS — I2510 Atherosclerotic heart disease of native coronary artery without angina pectoris: Secondary | ICD-10-CM | POA: Insufficient documentation

## 2019-04-06 DIAGNOSIS — M545 Low back pain, unspecified: Secondary | ICD-10-CM

## 2019-04-06 DIAGNOSIS — Z7982 Long term (current) use of aspirin: Secondary | ICD-10-CM | POA: Diagnosis not present

## 2019-04-06 DIAGNOSIS — Y92410 Unspecified street and highway as the place of occurrence of the external cause: Secondary | ICD-10-CM | POA: Insufficient documentation

## 2019-04-06 DIAGNOSIS — Z8673 Personal history of transient ischemic attack (TIA), and cerebral infarction without residual deficits: Secondary | ICD-10-CM | POA: Insufficient documentation

## 2019-04-06 DIAGNOSIS — R1031 Right lower quadrant pain: Secondary | ICD-10-CM | POA: Insufficient documentation

## 2019-04-06 LAB — URINALYSIS, ROUTINE W REFLEX MICROSCOPIC
Bacteria, UA: NONE SEEN
Bilirubin Urine: NEGATIVE
Glucose, UA: 50 mg/dL — AB
Ketones, ur: NEGATIVE mg/dL
Leukocytes,Ua: NEGATIVE
Nitrite: NEGATIVE
Protein, ur: >=300 mg/dL — AB
Specific Gravity, Urine: 1.008 (ref 1.005–1.030)
pH: 7 (ref 5.0–8.0)

## 2019-04-06 LAB — COMPREHENSIVE METABOLIC PANEL
ALT: 22 U/L (ref 0–44)
AST: 29 U/L (ref 15–41)
Albumin: 4.2 g/dL (ref 3.5–5.0)
Alkaline Phosphatase: 57 U/L (ref 38–126)
Anion gap: 14 (ref 5–15)
BUN: 9 mg/dL (ref 8–23)
CO2: 21 mmol/L — ABNORMAL LOW (ref 22–32)
Calcium: 9.6 mg/dL (ref 8.9–10.3)
Chloride: 101 mmol/L (ref 98–111)
Creatinine, Ser: 0.83 mg/dL (ref 0.44–1.00)
GFR calc Af Amer: >60 mL/min (ref >60)
GFR calc non Af Amer: >60 mL/min (ref >60)
Glucose, Bld: 186 mg/dL — ABNORMAL HIGH (ref 70–99)
Potassium: 3.9 mmol/L (ref 3.5–5.1)
Sodium: 136 mmol/L (ref 135–145)
Total Bilirubin: 0.4 mg/dL (ref 0.3–1.2)
Total Protein: 7.8 g/dL (ref 6.5–8.1)

## 2019-04-06 LAB — CBC WITH DIFFERENTIAL/PLATELET
Abs Immature Granulocytes: 0.1 10*3/uL — ABNORMAL HIGH (ref 0.00–0.07)
Basophils Absolute: 0.1 10*3/uL (ref 0.0–0.1)
Basophils Relative: 1 %
Eosinophils Absolute: 0.1 10*3/uL (ref 0.0–0.5)
Eosinophils Relative: 1 %
HCT: 44.3 % (ref 36.0–46.0)
Hemoglobin: 15.6 g/dL — ABNORMAL HIGH (ref 12.0–15.0)
Immature Granulocytes: 1 %
Lymphocytes Relative: 21 %
Lymphs Abs: 2.2 10*3/uL (ref 0.7–4.0)
MCH: 31.6 pg (ref 26.0–34.0)
MCHC: 35.2 g/dL (ref 30.0–36.0)
MCV: 89.7 fL (ref 80.0–100.0)
Monocytes Absolute: 0.5 10*3/uL (ref 0.1–1.0)
Monocytes Relative: 5 %
Neutro Abs: 7.7 10*3/uL (ref 1.7–7.7)
Neutrophils Relative %: 71 %
Platelets: 262 10*3/uL (ref 150–400)
RBC: 4.94 MIL/uL (ref 3.87–5.11)
RDW: 12.8 % (ref 11.5–15.5)
WBC: 10.7 10*3/uL — ABNORMAL HIGH (ref 4.0–10.5)
nRBC: 0 % (ref 0.0–0.2)

## 2019-04-06 LAB — ETHANOL: Alcohol, Ethyl (B): <10 mg/dL (ref <10)

## 2019-04-06 LAB — I-STAT CREATININE, ED: Creatinine, Ser: 0.7 mg/dL (ref 0.44–1.00)

## 2019-04-06 LAB — PROTIME-INR
INR: 1 (ref 0.8–1.2)
Prothrombin Time: 13 seconds (ref 11.4–15.2)

## 2019-04-06 LAB — SAMPLE TO BLOOD BANK

## 2019-04-06 MED ORDER — SODIUM CHLORIDE 0.9 % IV BOLUS
1000.0000 mL | Freq: Once | INTRAVENOUS | Status: AC
  Administered 2019-04-06: 15:00:00 1000 mL via INTRAVENOUS

## 2019-04-06 MED ORDER — OXYCODONE-ACETAMINOPHEN 5-325 MG PO TABS: 1.0000 | ORAL_TABLET | Freq: Four times a day (QID) | ORAL | 0 refills | Status: DC | PRN

## 2019-04-06 MED ORDER — ONDANSETRON HCL 4 MG/2ML IJ SOLN
4.0000 mg | Freq: Once | INTRAMUSCULAR | Status: AC
  Administered 2019-04-06: 4 mg via INTRAVENOUS
  Filled 2019-04-06: qty 2

## 2019-04-06 MED ORDER — MORPHINE SULFATE (PF) 4 MG/ML IV SOLN
4.0000 mg | Freq: Once | INTRAVENOUS | Status: AC
  Administered 2019-04-06: 19:00:00 4 mg via INTRAVENOUS
  Filled 2019-04-06: qty 1

## 2019-04-06 MED ORDER — MORPHINE SULFATE (PF) 4 MG/ML IV SOLN
4.0000 mg | Freq: Once | INTRAVENOUS | Status: AC
  Administered 2019-04-06: 4 mg via INTRAVENOUS
  Filled 2019-04-06: qty 1

## 2019-04-06 MED ORDER — OXYCODONE-ACETAMINOPHEN 5-325 MG PO TABS
1.0000 | ORAL_TABLET | Freq: Once | ORAL | Status: AC
  Administered 2019-04-06: 22:00:00 1 via ORAL
  Filled 2019-04-06: qty 1

## 2019-04-06 MED ORDER — IOHEXOL 300 MG/ML  SOLN
100.0000 mL | Freq: Once | INTRAMUSCULAR | Status: AC | PRN
  Administered 2019-04-06: 17:00:00 100 mL via INTRAVENOUS
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
## 2019-04-06 NOTE — ED Notes (Addendum)
Discharge instructions discussed with pt and husband at bedside. Pt verbalized understanding. Pt stable and ambulatory. No signature pad available.

## 2019-04-06 NOTE — Progress Notes (Signed)
Orthopedic Tech Progress Note Patient Details:  Danielle Farrell 1955-12-23 [id number]  Patient ID: [name], female   DOB: [birth year], 63 y.o.   MRN: [id number] Called in brace order to [place]  Karolee Stamps 04/06/2019, 7:58 PM
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
## 2019-04-06 NOTE — ED Notes (Signed)
Pt provided crackers and coke. Ok w/ EDP Rona Ravens for pt to eat and drink.
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
## 2019-04-06 NOTE — ED Notes (Signed)
TSLO rep at bedside

## 2019-04-06 NOTE — ED Notes (Signed)
RN called ortho to place brace. Waiting on brace to be delivered

## 2019-04-11 ENCOUNTER — Encounter (HOSPITAL_COMMUNITY): Payer: Medicare Other

## 2019-04-11 ENCOUNTER — Ambulatory Visit: Payer: Medicare Other | Admitting: Family

## 2019-04-23 ENCOUNTER — Other Ambulatory Visit: Payer: Self-pay | Admitting: Family Medicine

## 2019-04-23 DIAGNOSIS — E2839 Other primary ovarian failure: Secondary | ICD-10-CM

## 2019-08-16 ENCOUNTER — Encounter: Payer: Self-pay | Admitting: Cardiology

## 2019-09-27 ENCOUNTER — Encounter: Payer: Self-pay | Admitting: Cardiology

## 2019-10-08 ENCOUNTER — Ambulatory Visit: Payer: Medicare Other | Admitting: Cardiology

## 2019-10-08 ENCOUNTER — Other Ambulatory Visit: Payer: Self-pay

## 2019-10-08 ENCOUNTER — Encounter: Payer: Self-pay | Admitting: Cardiology

## 2019-10-08 VITALS — BP 128/82 | HR 62 | Ht 65.0 in | Wt 120.4 lb

## 2019-10-08 DIAGNOSIS — Z720 Tobacco use: Secondary | ICD-10-CM

## 2019-10-08 DIAGNOSIS — E1169 Type 2 diabetes mellitus with other specified complication: Secondary | ICD-10-CM

## 2019-10-08 DIAGNOSIS — Z951 Presence of aortocoronary bypass graft: Secondary | ICD-10-CM

## 2019-10-08 DIAGNOSIS — I119 Hypertensive heart disease without heart failure: Secondary | ICD-10-CM

## 2019-10-08 DIAGNOSIS — E785 Hyperlipidemia, unspecified: Secondary | ICD-10-CM

## 2019-10-08 DIAGNOSIS — I25119 Atherosclerotic heart disease of native coronary artery with unspecified angina pectoris: Secondary | ICD-10-CM | POA: Diagnosis not present

## 2019-10-08 NOTE — Patient Instructions (Addendum)
Medication Instructions:  NO CHANGES  *If you need a refill on your cardiac medications before your next appointment, please call your pharmacy*   Lab Work: NOT NEEDED   Testing/Procedures: NOT NEEDED   Follow-Up: At [facility], you and your health needs are our priority.  As part of our continuing mission to provide you with exceptional heart care, we have created designated Provider Care Teams.  These Care Teams include your primary Cardiologist (physician) and Advanced Practice Providers (APPs -  Physician Assistants and Nurse Practitioners) who all work together to provide you with the care you need, when you need it.   Your next appointment:   12 month(s)  The format for your next appointment:   In Person  Provider:   David Harding, MD   Other Instructions N/A 
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
## 2019-10-12 ENCOUNTER — Encounter: Payer: Self-pay | Admitting: Cardiology

## 2019-10-12 DIAGNOSIS — Z720 Tobacco use: Secondary | ICD-10-CM | POA: Insufficient documentation

## 2019-10-12 NOTE — Assessment & Plan Note (Signed)
Essentially Left Main disease involving both LAD and LCx ostia referred for CABG x4 back in 2002.  Interestingly, she actually survived aortobifem surgery prior to that.  Free LIMA was used for LAD anastomosis along with pedicle RIMA-RCA. Myoview in April 2018 was nonischemic.  This correlates with her not having any anginal symptoms. Plan:   Continue low-dose aspirin (the higher doses as needed for headaches).  Continue current dose of metoprolol tartrate (would not titrate further because of heart rate 62).  Continue current dose of lisinopril.  Appropriately split in half.  She seems to be tolerating the higher dose Crestor and based on labs in June 2020, LDL is almost at goal at 72.  She should be due to get labs checked soon.

## 2019-10-12 NOTE — Assessment & Plan Note (Signed)
We talked about smoking cessation for few minutes.  She did not seem to be all that interested in options for stopping.  We talked about trying at least cut down 1 to 2 cigarettes a day each week.  I reminded her that she has had bypass surgeries of her aorta iliac, carotid and coronary disease and survived.  Amazingly, everything seems to be stable despite her smoking, but this will last for long.  Need to continue to push this issue.  Also stressed the importance of getting COVID-19 vaccine.

## 2019-10-12 NOTE — Assessment & Plan Note (Addendum)
She had a pretty good drop in LDL from 108 in 2018 down to 72 in 2020.  This was with a modest increase in rosuvastatin to 10 mg. Should be due for labs to be checked soon by PCP.  If not quite at less than 70, would consider titrating up to 20 mg daily or adding ezetimibe.  May be easier simply to increase to titrate existing medication as opposed to adding another medicine.  We will defer lab checks to her PCP was also following her glycemic control.  If A1c continues to be elevated, would consider SGLT-1 inhibitor versus GLP 2 agonist.

## 2019-10-12 NOTE — Assessment & Plan Note (Signed)
No notable anginal symptoms.  Myoview test in August 2018 was nonischemic.  Consider recheck of Myoview in the fall 2022

## 2019-10-12 NOTE — Assessment & Plan Note (Signed)
She has coronary disease but also has had evidence of GRII DD back in 2014.  No active heart failure symptoms, euvolemic on exam. Is on lisinopril and metoprolol with appropriate blood pressure control.-Did not tolerate once daily dosing of lisinopril because of orthostatic dizziness.
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
## 2020-06-04 ENCOUNTER — Other Ambulatory Visit (HOSPITAL_COMMUNITY): Payer: Self-pay

## 2020-06-05 ENCOUNTER — Telehealth: Payer: Self-pay | Admitting: Physician Assistant

## 2020-06-05 NOTE — Telephone Encounter (Signed)
Called to discuss with patient about Covid symptoms and the use of sotrovimab, bamlanivimab/etesevimab or casirivimab/imdevimab, a monoclonal antibody infusion for those with mild to moderate Covid symptoms and at a high risk of hospitalization.  Pt is qualified for this infusion at the [facility] due to; Specific high risk criteria : Older age (>/= 65 yo), Diabetes and Cardiovascular disease or hypertension   Message left to call back our hotline [phone]. My chart message sent if active on Mychart.   Cline Crock PA-C

## 2020-06-13 ENCOUNTER — Encounter (HOSPITAL_COMMUNITY): Payer: Self-pay | Admitting: Emergency Medicine

## 2020-06-13 ENCOUNTER — Other Ambulatory Visit: Payer: Self-pay

## 2020-06-13 ENCOUNTER — Emergency Department (HOSPITAL_COMMUNITY)
Admission: EM | Admit: 2020-06-13 | Discharge: 2020-06-14 | Disposition: A | Discharge status: Home / Self Care | Payer: Medicare Other | Attending: Emergency Medicine | Admitting: Emergency Medicine

## 2020-06-13 DIAGNOSIS — E119 Type 2 diabetes mellitus without complications: Secondary | ICD-10-CM | POA: Diagnosis not present

## 2020-06-13 DIAGNOSIS — Z79899 Other long term (current) drug therapy: Secondary | ICD-10-CM | POA: Insufficient documentation

## 2020-06-13 DIAGNOSIS — Z7982 Long term (current) use of aspirin: Secondary | ICD-10-CM | POA: Diagnosis not present

## 2020-06-13 DIAGNOSIS — I10 Essential (primary) hypertension: Secondary | ICD-10-CM | POA: Diagnosis not present

## 2020-06-13 DIAGNOSIS — U071 COVID-19: Secondary | ICD-10-CM | POA: Diagnosis not present

## 2020-06-13 DIAGNOSIS — Z951 Presence of aortocoronary bypass graft: Secondary | ICD-10-CM | POA: Insufficient documentation

## 2020-06-13 DIAGNOSIS — F17210 Nicotine dependence, cigarettes, uncomplicated: Secondary | ICD-10-CM | POA: Diagnosis not present

## 2020-06-13 DIAGNOSIS — Z7984 Long term (current) use of oral hypoglycemic drugs: Secondary | ICD-10-CM | POA: Insufficient documentation

## 2020-06-13 DIAGNOSIS — I25119 Atherosclerotic heart disease of native coronary artery with unspecified angina pectoris: Secondary | ICD-10-CM | POA: Insufficient documentation

## 2020-06-13 DIAGNOSIS — R197 Diarrhea, unspecified: Secondary | ICD-10-CM | POA: Diagnosis present

## 2020-06-13 LAB — COMPREHENSIVE METABOLIC PANEL
ALT: 12 U/L (ref 0–44)
AST: 24 U/L (ref 15–41)
Albumin: 3 g/dL — ABNORMAL LOW (ref 3.5–5.0)
Alkaline Phosphatase: 47 U/L (ref 38–126)
Anion gap: 13 (ref 5–15)
BUN: 14 mg/dL (ref 8–23)
CO2: 18 mmol/L — ABNORMAL LOW (ref 22–32)
Calcium: 9.1 mg/dL (ref 8.9–10.3)
Chloride: 99 mmol/L (ref 98–111)
Creatinine, Ser: 0.79 mg/dL (ref 0.44–1.00)
GFR, Estimated: >60 mL/min (ref >60)
Glucose, Bld: 184 mg/dL — ABNORMAL HIGH (ref 70–99)
Potassium: 3.7 mmol/L (ref 3.5–5.1)
Sodium: 130 mmol/L — ABNORMAL LOW (ref 135–145)
Total Bilirubin: 0.3 mg/dL (ref 0.3–1.2)
Total Protein: 6.6 g/dL (ref 6.5–8.1)

## 2020-06-13 LAB — URINALYSIS, ROUTINE W REFLEX MICROSCOPIC
Bacteria, UA: NONE SEEN
Bilirubin Urine: NEGATIVE
Glucose, UA: NEGATIVE mg/dL
Hgb urine dipstick: NEGATIVE
Ketones, ur: 5 mg/dL — AB
Leukocytes,Ua: NEGATIVE
Nitrite: NEGATIVE
Protein, ur: >=300 mg/dL — AB
Specific Gravity, Urine: 1.013 (ref 1.005–1.030)
pH: 5 (ref 5.0–8.0)

## 2020-06-13 LAB — CBC
HCT: 37.2 % (ref 36.0–46.0)
Hemoglobin: 12.7 g/dL (ref 12.0–15.0)
MCH: 29.1 pg (ref 26.0–34.0)
MCHC: 34.1 g/dL (ref 30.0–36.0)
MCV: 85.1 fL (ref 80.0–100.0)
Platelets: 203 10*3/uL (ref 150–400)
RBC: 4.37 MIL/uL (ref 3.87–5.11)
RDW: 13.1 % (ref 11.5–15.5)
WBC: 5.1 10*3/uL (ref 4.0–10.5)
nRBC: 0 % (ref 0.0–0.2)

## 2020-06-13 LAB — LIPASE, BLOOD: Lipase: 27 U/L (ref 11–51)

## 2020-06-13 MED ORDER — ACETAMINOPHEN 325 MG PO TABS
650.0000 mg | ORAL_TABLET | Freq: Once | ORAL | Status: AC | PRN
  Administered 2020-06-13: 650 mg via ORAL
  Filled 2020-06-13: qty 2

## 2020-06-14 ENCOUNTER — Emergency Department (HOSPITAL_COMMUNITY): Payer: Medicare Other

## 2020-06-14 LAB — RESP PANEL BY RT-PCR (FLU A&B, COVID) ARPGX2
Influenza A by PCR: NEGATIVE
Influenza B by PCR: NEGATIVE
SARS Coronavirus 2 by RT PCR: POSITIVE — AB

## 2020-06-14 NOTE — Discharge Instructions (Addendum)
You need to quarantine at home for 10 days.  If you become short of breath, you should come back to the ER.  Take Tylenol for fever.  You can use over the counter imodium for diarrhea.

## 2020-06-14 NOTE — ED Provider Notes (Signed)
[facility]   CSN: [id number] Arrival date & time: 06/13/20  1444     History Chief Complaint  Patient presents with  . Diarrhea    Pt having diarrhea, cough and body ache for 3 weeks.    Danielle Farrell is a 64 y.o. female.  Patient presents to the emergency department with a chief complaint of diarrhea.  She states she has been having the diarrhea for the past 3 weeks.  She states that she was told by her PCP to come to the emergency department for further work-up and evaluation.  Patient denies any recent antibiotic use.  She states that she got the diarrhea from a friend, who is a Engineer, civil (consulting)nurse.  She denies measured fever, but is noted to be febrile to 101 in triage today.  Tylenol was given with good improvement of fever.  She denies having any blood in her stool.  Denies having any significant abdominal pain.  Additionally, patient reports cough times a few days.  She is unvaccinated against Covid.  The history is provided by the patient. No language interpreter was used.       Past Medical History:  Diagnosis Date  . Bronchitis Jan 2014   became pneumonia  . Carotid bruit   . Coronary artery disease involving left main coronary artery 04/2001   Catheter distal LM 50-60%-extending into 50% ostial LCx and 80-90% ostial LAD.  Mid LAD 50-60% and mid RCA 50%.--Referred for CABG x4 (free LIMA-LAD with SVG button, pedicle RIMA-RCA, SVG-D1, SVG-OM1) Dr. Cornelius Moraswen  . DDD (degenerative disc disease), cervical   . Diabetes mellitus   . Dizziness   . Headache   . Hyperlipidemia   . Hypertension   . IHD (ischemic heart disease)   . Migraine headache    Severe Posterior Subluxation of C6  over C7 and C5 over C6 w/broad based disc protusion at C5-6  resulting in canal  narrowing  . 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 Persistent headaches   . Pneumonia Jan. 2014  .  Stroke [facility](HCC)     Patient Active Problem List   Diagnosis Date Noted  . Declined smoking cessation 10/12/2019  . Neck pain 09/07/2013  . Ehlers-Danlos disease 06/18/2013  . Acute encephalopathy 06/08/2013  . Headache 02/24/2013  . Occlusion and stenosis of carotid artery without mention of cerebral infarction 08/15/2012  . Insomnia 05/18/2012  . Diabetes (HCC) 02/05/2011  . Benign hypertensive heart disease without heart failure 02/05/2011  . Hx of CABG 02/05/2011  . Carotid stenosis, asymptomatic 02/05/2011  . Hyperlipidemia associated with type 2 diabetes mellitus (HCC) 02/05/2011  . Coronary artery disease involving native coronary artery of native heart with angina pectoris (HCC) 10/20/2000    Past Surgical History:  Procedure Laterality Date  . AORTO-FEMORAL BYPASS GRAFT  02/2001   Dr. Hart RochesterLawson.   Ao-BiFem wioth L Renal A bypass.  Marland Kitchen. CARDIOVASCULAR STRESS TEST  11/23/2006   EF 64%  . CAROTID-SUBCLAVIAN BYPASS GRAFT Left 08/2001   Dr. Hart RochesterLawson -> Total occlusion of proximal subclavian up to 1 cm proximal to vertebral artery; subclavian artery transected and endarterectomy performed prior to anastomosis onto the common carotid artery, proximal subclavian stump oversewn  . CATARACT EXTRACTION Right 09/2000  . CORONARY ARTERY BYPASS GRAFT  05/03/2001   Dr. Cornelius Moraswen; x 4.  (free LIMA-LAD with SVG button, pedicle RIMA-RCA, SVG-D1, SVG-OM1)  . LEFT HEART CATH AND  CORONARY ANGIOGRAPHY  04/28/2001   Dr. Delane Ginger: performed for class IV angina (across the back) and abnormal Cardiolite: Distal LM 50-60% extending into 50% ostial LCx and ostial-proximal LAD 80-90%. Mid LAD 50-60%.  Moderate sized D1, OM 1 & OM 2. RCA proximal 50% with there is damping of catheter. Referred for CABG after proximal LAD lesion.  Marland Kitchen NM MYOVIEW LTD  10/20/2016   EF 65-70%.  Normal wall motion.  No ischemia or infarction.  LOW RISK  . TRANSTHORACIC ECHOCARDIOGRAM  06/2013   EF 60-65%. No R WMA. GRII DD. Normal valves      OB History   No obstetric history on file.     Family History  Problem Relation Age of Onset  . Cancer Mother        Tiffany Kocher  . Cancer Father        Prostate  . Heart attack Brother 46       Cardiac Infraction  . Heart attack Paternal Grandfather     Social History   Tobacco Use  . Smoking status: Current Every Day Smoker    Packs/day: 0.50    Years: 43.00    Pack years: 21.50    Types: Cigarettes  . Smokeless tobacco: Never Used  Substance Use Topics  . Alcohol use: No    Alcohol/week: 0.0 standard drinks  . Drug use: No    Home Medications Prior to Admission medications   Medication Sig Start Date End Date Taking? Authorizing Provider  aspirin 81 MG EC tablet Take 81 mg by mouth daily after supper.   Yes [provider]  aspirin EC 325 MG tablet Take 650 mg by mouth every 6 (six) hours as needed (headache).   Yes [provider]  glipiZIDE (GLUCOTROL) 10 MG tablet Take 10 mg by mouth 2 (two) times daily before a meal.    Yes [provider]  lisinopril (ZESTRIL) 30 MG tablet Take 15 mg by mouth 2 (two) times daily before a meal. 03/13/19  Yes [provider]  metFORMIN (GLUCOPHAGE) 1000 MG tablet Take 1,000-1,500 mg by mouth See admin instructions. Take 1 tablet (1000 mg) by mouth daily before breakfast and 1 1/2 tablets (1500 mg) by mouth daily before supper   Yes [provider]  metoprolol tartrate (LOPRESSOR) 50 MG tablet Take 25 mg by mouth 2 (two) times daily.   Yes [provider]  PARoxetine (PAXIL) 10 MG tablet Take 10 mg by mouth daily before breakfast.    Yes [provider]  rosuvastatin (CRESTOR) 10 MG tablet Take 10 mg by mouth at bedtime. Take 10 mg tablet by mouth daily   Yes [provider]  vitamin B-12 (CYANOCOBALAMIN) 1000 MCG tablet Take 1,000 mcg by mouth daily before breakfast.   Yes [provider]    Allergies    Erythromycin, Zantac [ranitidine hcl], Farxiga  [dapagliflozin], Adhesive [tape], and Lipitor [atorvastatin calcium]  Review of Systems   Review of Systems  All other systems reviewed and are negative.   Physical Exam Updated Vital Signs BP (!) 152/74 (BP Location: Right Arm)   Pulse 66   Temp 98.5 F (36.9 C) (Oral)   Resp 18   Ht 5\' 5"  (1.651 m)   Wt 49.9 kg   SpO2 96%   BMI 18.30 kg/m   Physical Exam Vitals and nursing note reviewed.  Constitutional:      General: She is not in acute distress.    Appearance: She is well-developed and well-nourished.  HENT:     Head: Normocephalic and atraumatic.  Eyes:     Conjunctiva/sclera: Conjunctivae normal.  Cardiovascular:     Rate and Rhythm: Normal rate and regular rhythm.     Heart sounds: No murmur heard.   Pulmonary:     Effort: Pulmonary effort is normal. No respiratory distress.     Breath sounds: Normal breath sounds.  Abdominal:     Palpations: Abdomen is soft.     Tenderness: There is no abdominal tenderness.  Musculoskeletal:        General: No edema. Normal range of motion.     Cervical back: Neck supple.  Skin:    General: Skin is warm and dry.  Neurological:     Mental Status: She is alert and oriented to person, place, and time.  Psychiatric:        Mood and Affect: Mood and affect and mood normal.        Behavior: Behavior normal.     ED Results / Procedures / Treatments   Labs (all labs ordered are listed, but only abnormal results are displayed) Labs Reviewed  COMPREHENSIVE METABOLIC PANEL - Abnormal; Notable for the following components:      Result Value   Sodium 130 (*)    CO2 18 (*)    Glucose, Bld 184 (*)    Albumin 3.0 (*)    All other components within normal limits  URINALYSIS, ROUTINE W REFLEX MICROSCOPIC - Abnormal; Notable for the following components:   Ketones, ur 5 (*)    Protein, ur >=300 (*)    All other components within normal limits  RESP PANEL BY RT-PCR (FLU A&B, COVID) ARPGX2  GASTROINTESTINAL PANEL BY PCR, STOOL  (REPLACES STOOL CULTURE)  LIPASE, BLOOD  CBC    EKG None  Radiology DG Chest Port 1 View  Result Date: 06/14/2020 CLINICAL DATA:  Initial evaluation for acute cough. EXAM: PORTABLE CHEST 1 VIEW COMPARISON:  Prior radiograph from 04/06/2019. FINDINGS: Median sternotomy wires with underlying CABG markers and surgical clips noted. Transverse heart size is stable, and remains within normal limits. Mediastinal silhouette normal. Aortic atherosclerosis. Lungs normally inflated. Hazy infiltrate seen within the left perihilar region/left mid lung, suspicious for pneumonia given the provided history of cough. Underlying peribronchial thickening elsewhere within the lungs. No edema or effusion. No pneumothorax. No acute osseous finding. Degenerative changes noted about the shoulders. IMPRESSION: 1. Hazy infiltrate within the left perihilar region/left mid lung, suspicious for pneumonia given the provided history of cough. 2.  Aortic Atherosclerosis (ICD10-I70.0). Electronically Signed   By: Rise Mu M.D.   On: 06/14/2020 02:10    Procedures Procedures (including critical care time)  Medications Ordered in ED Medications  acetaminophen (TYLENOL) tablet 650 mg (650 mg Oral Given 06/13/20 1536)    ED Course  I have reviewed the triage vital signs and the nursing notes.  Pertinent labs & imaging results that were available during my care of the patient were reviewed by me and considered in my medical decision making (see chart for details).    MDM Rules/Calculators/A&P                          This patient complains of diarrhea, cough, and fever, this involves an extensive number of treatment options, and is a complaint that carries with it a high risk of complications and morbidity.    Differential Dx Covid, infectious diarrhea, pneumonia.  Pertinent Labs I ordered, reviewed, and interpreted  labs, which included covid which is positive, CBC, BMP are all reassuring.  Imaging  Interpretation I ordered imaging studies which included CXR.  I independently visualized and interpreted the CXR, which showed hazy opacities.    Reassessments After the interventions stated above, I reevaluated the patient and found stable for discharge. No hypoxia.  Plan Discharge with PCP follow-up.  Return precautions discussed.    Final Clinical Impression(s) / ED Diagnoses Final diagnoses:  COVID-19  Diarrhea, unspecified type    Rx / DC Orders ED Discharge Orders    None       Roxy Horseman, PA-C 06/14/20 0419    Ward, Layla Maw, DO 06/14/20 [phone]

## 2020-06-24 ENCOUNTER — Other Ambulatory Visit: Payer: Self-pay | Admitting: *Deleted

## 2020-06-24 DIAGNOSIS — I739 Peripheral vascular disease, unspecified: Secondary | ICD-10-CM

## 2020-06-24 DIAGNOSIS — I6523 Occlusion and stenosis of bilateral carotid arteries: Secondary | ICD-10-CM

## 2020-07-11 ENCOUNTER — Ambulatory Visit: Payer: Medicare Other | Admitting: Physician Assistant

## 2020-07-11 ENCOUNTER — Other Ambulatory Visit: Payer: Self-pay

## 2020-07-11 ENCOUNTER — Ambulatory Visit (INDEPENDENT_AMBULATORY_CARE_PROVIDER_SITE_OTHER)
Admission: RE | Admit: 2020-07-11 | Discharge: 2020-07-11 | Disposition: A | Discharge status: Home / Self Care | Payer: Medicare Other | Source: Ambulatory Visit | Attending: Physician Assistant | Admitting: Physician Assistant

## 2020-07-11 ENCOUNTER — Ambulatory Visit (HOSPITAL_COMMUNITY)
Admission: RE | Admit: 2020-07-11 | Discharge: 2020-07-11 | Disposition: A | Discharge status: Home / Self Care | Payer: Medicare Other | Source: Ambulatory Visit | Attending: Physician Assistant | Admitting: Physician Assistant

## 2020-07-11 VITALS — BP 136/75 | HR 50 | Temp 98.0°F | Resp 14 | Ht 65.0 in | Wt 113.0 lb

## 2020-07-11 DIAGNOSIS — I739 Peripheral vascular disease, unspecified: Secondary | ICD-10-CM | POA: Insufficient documentation

## 2020-07-11 DIAGNOSIS — F17200 Nicotine dependence, unspecified, uncomplicated: Secondary | ICD-10-CM

## 2020-07-11 DIAGNOSIS — I6523 Occlusion and stenosis of bilateral carotid arteries: Secondary | ICD-10-CM | POA: Diagnosis present

## 2020-07-11 NOTE — Progress Notes (Signed)
HISTORY AND PHYSICAL     CC:  follow up. Requesting Provider:  Kaleen Mask, *  HPI: This is a 65 y.o. female who is here today for follow up and was a pt of Dr. Hart Rochester.  She had hx of left carotid subclavian bypass in 2003 for left arm claudication and aortobifemoral bypass grafting in 2002.   Pt was last seen on 10/28/2017.  At that time, she was not having any left arm issues or claudication.  She has hx of CABG and was not having any chest pain.   She has hx of migraines with photosensitivity, Ehlers-Danlus syndrome (excessive ROM of her joints).    She was seen in the ER on June 14, 2020 had tested positive for Covid19.  She states that it presented with bilateral leg weakness and she couldn't walk.  This did improve.  She did have ~ 20lb weight loss with covid.  She is not vaccinated and does not want the vaccine.   The pt returns today for follow up.  She denies any amaurosis fugax, speech difficulty, weakness/numbness or paralysis of any extremity with the exception of BLE weakness during covid.  She does not have any pain in the left hand.  She does not have any dizziness when she raises her left arm.  She denies any chest pain.    The pt is on a statin for cholesterol management.    The pt is on an aspirin.    Other AC:  none The pt is on ACEI, BB for hypertension.  The pt does have diabetes. Tobacco hx:  current    Past Medical History:  Diagnosis Date  . Bronchitis Jan 2014   became pneumonia  . Carotid bruit   . Coronary artery disease involving left main coronary artery 04/2001   Catheter distal LM 50-60%-extending into 50% ostial LCx and 80-90% ostial LAD.  Mid LAD 50-60% and mid RCA 50%.--Referred for CABG x4 (free LIMA-LAD with SVG button, pedicle RIMA-RCA, SVG-D1, SVG-OM1) Dr. Cornelius Moras  . DDD (degenerative disc disease), cervical   . Diabetes mellitus   . Dizziness   . Headache   . Hyperlipidemia   . Hypertension   . IHD (ischemic heart disease)   .  Migraine headache    Severe Posterior Subluxation of C6  over C7 and C5 over C6 w/broad based disc protusion at C5-6  resulting in canal  narrowing  . 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 Persistent headaches   . Pneumonia Jan. 2014  . Stroke [facility])     Past Surgical History:  Procedure Laterality Date  . AORTO-FEMORAL BYPASS GRAFT  02/2001   Dr. Hart Rochester.   Ao-BiFem wioth L Renal A bypass.  Marland Kitchen CARDIOVASCULAR STRESS TEST  11/23/2006   EF 64%  . CAROTID-SUBCLAVIAN BYPASS GRAFT Left 08/2001   Dr. Hart Rochester -> Total occlusion of proximal subclavian up to 1 cm proximal to vertebral artery; subclavian artery transected and endarterectomy performed prior to anastomosis onto the common carotid artery, proximal subclavian stump oversewn  . CATARACT EXTRACTION Right 09/2000  . CORONARY ARTERY BYPASS GRAFT  05/03/2001   Dr. Cornelius Moras; x 4.  (free LIMA-LAD with SVG button, pedicle RIMA-RCA, SVG-D1, SVG-OM1)  . LEFT HEART CATH AND CORONARY ANGIOGRAPHY  04/28/2001   Dr. Delane Ginger: performed for class IV angina (across the back) and abnormal Cardiolite: Distal LM 50-60% extending into 50% ostial LCx and ostial-proximal LAD 80-90%. Mid LAD  50-60%.  Moderate sized D1, OM 1 & OM 2. RCA proximal 50% with there is damping of catheter. Referred for CABG after proximal LAD lesion.  Marland Kitchen NM MYOVIEW LTD  10/20/2016   EF 65-70%.  Normal wall motion.  No ischemia or infarction.  LOW RISK  . TRANSTHORACIC ECHOCARDIOGRAM  06/2013   EF 60-65%. No R WMA. GRII DD. Normal valves    Allergies  Allergen Reactions  . Erythromycin Nausea And Vomiting and Swelling    Throw up  . Zantac [Ranitidine Hcl] Hives, Itching and Swelling    Facial swelling  . Farxiga [Dapagliflozin] Hives  . Adhesive [Tape] Rash    pls try paper tape  . Lipitor [Atorvastatin Calcium] Other (See Comments)    Muscle cramps in legs    Current Outpatient Medications  Medication  Sig Dispense Refill  . aspirin 81 MG EC tablet Take 81 mg by mouth daily after supper.    Marland Kitchen aspirin EC 325 MG tablet Take 650 mg by mouth every 6 (six) hours as needed (headache).    Marland Kitchen glipiZIDE (GLUCOTROL) 10 MG tablet Take 10 mg by mouth 2 (two) times daily before a meal.     . lisinopril (ZESTRIL) 30 MG tablet Take 15 mg by mouth 2 (two) times daily before a meal.    . metFORMIN (GLUCOPHAGE) 1000 MG tablet Take 1,000-1,500 mg by mouth See admin instructions. Take 1 tablet (1000 mg) by mouth daily before breakfast and 1 1/2 tablets (1500 mg) by mouth daily before supper    . metoprolol tartrate (LOPRESSOR) 50 MG tablet Take 25 mg by mouth 2 (two) times daily.    Marland Kitchen PARoxetine (PAXIL) 10 MG tablet Take 10 mg by mouth daily before breakfast.     . rosuvastatin (CRESTOR) 10 MG tablet Take 10 mg by mouth at bedtime. Take 10 mg tablet by mouth daily    . vitamin B-12 (CYANOCOBALAMIN) 1000 MCG tablet Take 1,000 mcg by mouth daily before breakfast.     No current facility-administered medications for this visit.    Family History  Problem Relation Age of Onset  . Cancer Mother        Tiffany Kocher  . Cancer Father        Prostate  . Heart attack Brother 46       Cardiac Infraction  . Heart attack Paternal Grandfather     Social History   Socioeconomic History  . Marital status: Single    Spouse name: [name]  . Number of children: 0  . Years of education: 4  . Highest education level: Not on file  Occupational History    Employer: DISABLED    Comment: Disabled  Tobacco Use  . Smoking status: Current Every Day Smoker    Packs/day: 0.50    Years: 43.00    Pack years: 21.50    Types: Cigarettes  . Smokeless tobacco: Never Used  Substance and Sexual Activity  . Alcohol use: No    Alcohol/week: 0.0 standard drinks  . Drug use: No  . Sexual activity: Not Currently  Other Topics Concern  . Not on file  Social History Narrative   Patient lives at home with her husband [name]).    Disabled.   Right handed   Education 12 th grade   Caffeine- One cup of coffee daily.   Social Determinants of Health   Financial Resource Strain: Not on file  Food Insecurity: Not on file  Transportation Needs: Not on file  Physical Activity: Not  on file  Stress: Not on file  Social Connections: Not on file  Intimate Partner Violence: Not on file     REVIEW OF SYSTEMS:   [X]  denotes positive finding, [ ]  denotes negative finding Cardiac  Comments:  Chest pain or chest pressure:    Shortness of breath upon exertion:    Short of breath when lying flat:    Irregular heart rhythm:        Vascular    Pain in calf, thigh, or hip brought on by ambulation:    Pain in feet at night that wakes you up from your sleep:     Blood clot in your veins:    Leg swelling:         Pulmonary    Oxygen at home:    Productive cough:     Wheezing:         Neurologic    Sudden weakness in arms or legs:     Sudden numbness in arms or legs:     Sudden onset of difficulty speaking or slurred speech:    Temporary loss of vision in one eye:     Problems with dizziness:         Gastrointestinal    Blood in stool:     Vomited blood:         Genitourinary    Burning when urinating:     Blood in urine:        Psychiatric    Major depression:         Hematologic    Bleeding problems:    Problems with blood clotting too easily:        Skin    Rashes or ulcers:        Constitutional    Fever or chills:      PHYSICAL EXAMINATION:  Today's Vitals   07/11/20 1015 07/11/20 1019  BP: 110/67 136/75  Pulse: (!) 50 (!) 50  Resp: 14   Temp: 98 F (36.7 C)   TempSrc: Temporal   SpO2: 98%   Weight: 113 lb (51.3 kg)   Height: 5\' 5"  (1.651 m)    Body mass index is 18.8 kg/m.   General:  WDWN in NAD; vital signs documented above Gait: Normal HENT: WNL, normocephalic Pulmonary: normal non-labored breathing  Cardiac: regular HR, without Murmur; with right carotid bruit Abdomen:  soft, NT, no masses; aortic pulse is not palpable Skin: without rashes Vascular Exam/Pulses:  Right Left  Radial 2+ (normal) 2+ (normal)  Ulnar 2+ (normal) Unable to palpate  Femoral 2+ (normal) 2+ (normal)  Popliteal Unable to palpate Unable to palpate  DP 2+ (normal) 2+ (normal)  PT Unable to palpate Unable to palpate   Extremities: without ischemic changes, without Gangrene , without cellulitis; without open wounds;  Musculoskeletal: no muscle wasting or atrophy  Neurologic: A&O X 3;  No focal weakness or paresthesias are detected Psychiatric:  The pt has Normal affect.   Non-Invasive Vascular Imaging:   ABI's/TBI's on 07/11/2020: Right:  1.14/0.68 (T) - great toe pressure:  105 Left:  1.12/0.75 (T) - great toe pressure:  115  Non-Invasive Vascular Imaging:   Carotid Duplex on 07/11/2020: Right: 40-59% ICA stenosis Left:   1-39% ICA stenosis Peak systolic velocity of 403 cm/sec seen in the CCA proximal to the SCA  transposition. Tardus Parvus waveforms seen throughout the left carotid  arteries.     Summary:  Right Carotid: Velocities in the right ICA are consistent with a  40-59%  stenosis.   Left Carotid: Velocities in the left ICA are consistent with a 1-39%  stenosis. Non-hemodynamically significant plaque <50% noted in the  CCA.   Vertebrals: Right vertebral artery demonstrates antegrade flow. Left  vertebral artery demonstrates retrograde flow.  Subclavians: Left subclavian artery was stenotic. Left subclavian artery  flow was disturbed. Normal flow hemodynamics were seen in the  right subclavian artery.    Previous ABI's/TBI's on 10/28/2017: Right:  1.11/0.72 - great toe pressure:  114 Left:  1.07/0.75 - great toe pressure:  118  Previous Carotid duplex on 10/28/2017: Right:  40-59% ICA stenosis Left:  1-39% ICA stenosis   ASSESSMENT/PLAN:: 64 y.o. female here for follow up for left carotid subclavian bypass in 2003 for left arm claudication and  aortobifemoral bypass grafting in 2002 both by Dr. Kellie Simmering  PAD -pt doing well and does not have any claudication sx, non healing wounds or rest pain.   -ABI remains unchanged -pt will f/u in one year with ABI  Carotid subclavian bypass (left) -pt remains asymptomatic.  She did have bilateral leg weakness with covid a couple of months ago but this is not consistent with stroke. -duplex today reveals elevated velocity that is seen in the CCA proximal to the SCA transposition.  Tardus Parvus waveforms seen throughout the left carotid arteries.  Pt does have an easily palpable left radial pulse without any sx.  I discussed the duplex with Dr. Donzetta Matters, and given she is asymptomatic and has a radial pulse, will bring her back in 6 months and repeat duplex.  If she develops sx in the meantime or if her duplex is similar at the next visit, it may warrant a CTA of the head and neck for evaluation.  I discussed this with the pt and she is in agreement.   -discussed s/s of stroke/TIA and if pt develops any sx, they know to call 911 or go to the emergency room -pt will f/u in 6 months with carotid duplex  Tobacco use: Pt continues to smoke.  She understands that it is important to quit.  I discussed in detail the reasons it is important to quit especially given her diabetes and hx of atherosclerotic disease.    Leontine Locket, [facility] [phone]  Clinic MD:   Donzetta Matters

## 2020-07-15 ENCOUNTER — Other Ambulatory Visit: Payer: Self-pay

## 2020-07-15 DIAGNOSIS — I6523 Occlusion and stenosis of bilateral carotid arteries: Secondary | ICD-10-CM

## 2020-10-17 ENCOUNTER — Telehealth: Payer: Self-pay | Admitting: Cardiology

## 2020-10-17 NOTE — Telephone Encounter (Signed)
4.15.22 LVM to sch 1 yr fu w/Dr Herbie [place].Alinda Dooms

## 2021-05-20 ENCOUNTER — Other Ambulatory Visit: Payer: Self-pay | Admitting: Family Medicine

## 2021-05-20 DIAGNOSIS — Z1231 Encounter for screening mammogram for malignant neoplasm of breast: Secondary | ICD-10-CM

## 2021-06-05 ENCOUNTER — Other Ambulatory Visit: Payer: Self-pay

## 2021-06-05 ENCOUNTER — Ambulatory Visit (HOSPITAL_COMMUNITY)
Admission: RE | Admit: 2021-06-05 | Discharge: 2021-06-05 | Disposition: A | Discharge status: Home / Self Care | Payer: Medicare Other | Source: Ambulatory Visit | Attending: Physician Assistant | Admitting: Physician Assistant

## 2021-06-05 ENCOUNTER — Ambulatory Visit: Payer: Medicare Other | Admitting: Physician Assistant

## 2021-06-05 VITALS — BP 139/73 | HR 84 | Temp 97.3°F | Resp 20 | Ht 65.0 in | Wt 120.0 lb

## 2021-06-05 DIAGNOSIS — I6523 Occlusion and stenosis of bilateral carotid arteries: Secondary | ICD-10-CM

## 2021-06-05 NOTE — Progress Notes (Signed)
History of Present Illness:  Patient is a 65 y.o. year old female who presents for evaluation of carotid stenosis. She has a history of left carotid subclavian bypass in 2003 by Dr. Hart Rochester for left UE claudication symptoms.   She has been asymptomatic since her surgery.  She also has a history of  aortobifemoral bypass grafting in 2002 by Dr. Hart Rochester.  She denise rest pain, claudication or non healing wounds.   The pt is on a statin for cholesterol management.    The pt is on an aspirin.    Other AC:  none The pt is on ACEI, BB for hypertension.  The pt does have diabetes. Tobacco hx:  current  Past Medical History:  Diagnosis Date   Bronchitis Jan 2014   became pneumonia   Carotid bruit    Coronary artery disease involving left main coronary artery 04/2001   Catheter distal LM 50-60%-extending into 50% ostial LCx and 80-90% ostial LAD.  Mid LAD 50-60% and mid RCA 50%.--Referred for CABG x4 (free LIMA-LAD with SVG button, pedicle RIMA-RCA, SVG-D1, SVG-OM1) Dr. 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 2014   Stroke [facility])     Past Surgical History:  Procedure Laterality Date   AORTO-FEMORAL BYPASS GRAFT  02/2001   Dr. Hart Rochester.   Ao-BiFem wioth L Renal A bypass.   CARDIOVASCULAR STRESS TEST  11/23/2006   EF 64%   CAROTID-SUBCLAVIAN BYPASS GRAFT Left 08/2001   Dr. Hart Rochester -> Total occlusion of proximal subclavian up to 1 cm proximal to vertebral artery; subclavian artery transected and endarterectomy performed prior to anastomosis onto the common carotid artery,  proximal subclavian stump oversewn   CATARACT EXTRACTION Right 09/2000   CORONARY ARTERY BYPASS GRAFT  05/03/2001   Dr. Cornelius Moras; x 4.  (free LIMA-LAD with SVG button, pedicle RIMA-RCA, SVG-D1, SVG-OM1)   LEFT HEART CATH AND CORONARY ANGIOGRAPHY  04/28/2001   Dr. Delane Ginger: performed for class IV angina (across the back) and abnormal Cardiolite: Distal LM 50-60% extending into 50% ostial LCx and ostial-proximal LAD 80-90%. Mid LAD 50-60%.  Moderate sized D1, OM 1 & OM 2. RCA proximal 50% with there is damping of catheter. Referred for CABG after proximal LAD lesion.   NM MYOVIEW LTD  10/20/2016   EF 65-70%.  Normal wall motion.  No ischemia or infarction.  LOW RISK   TRANSTHORACIC ECHOCARDIOGRAM  06/2013   EF 60-65%. No R WMA. GRII DD. Normal valves     Social History Social History   Tobacco Use   Smoking status: Every Day    Packs/day: 0.50    Years: 43.00    Pack years: 21.50    Types: Cigarettes   Smokeless tobacco: Never  Vaping Use   Vaping Use: Never used  Substance Use Topics   Alcohol use: No    Alcohol/week: 0.0 standard drinks   Drug use: No    Family History Family History  Problem Relation Age of Onset   Cancer  Mother        Uterain   Cancer Father        Prostate   Heart attack Brother 41       Cardiac Infraction   Heart attack Paternal Grandfather     Allergies  Allergies  Allergen Reactions   Erythromycin Nausea And Vomiting and Swelling    Throw up   Zantac [Ranitidine Hcl] Hives, Itching and Swelling    Facial swelling   Farxiga [Dapagliflozin] Hives   Adhesive [Tape] Rash    pls try paper tape   Lipitor [Atorvastatin Calcium] Other (See Comments)    Muscle cramps in legs     Current Outpatient Medications  Medication Sig Dispense Refill   aspirin 81 MG EC tablet Take 81 mg by mouth daily after supper.     glipiZIDE (GLUCOTROL) 10 MG tablet Take 10 mg by mouth 2 (two) times daily before a meal.      lisinopril (ZESTRIL) 30 MG tablet Take  15 mg by mouth 2 (two) times daily before a meal.     metFORMIN (GLUCOPHAGE) 1000 MG tablet Take 1,000 mg by mouth See admin instructions. Take 1 tablet (1000 mg) by mouth daily before breakfast and 1 by mouth daily before supper     metoprolol tartrate (LOPRESSOR) 50 MG tablet Take 25 mg by mouth 2 (two) times daily.     PARoxetine (PAXIL) 10 MG tablet Take 10 mg by mouth daily before breakfast.      rosuvastatin (CRESTOR) 10 MG tablet Take 10 mg by mouth at bedtime. Take 10 mg tablet by mouth daily     vitamin B-12 (CYANOCOBALAMIN) 1000 MCG tablet Take 1,000 mcg by mouth daily before breakfast.     No current facility-administered medications for this visit.    ROS:   General:  No weight loss, Fever, chills  HEENT: No recent headaches, no nasal bleeding, no visual changes, no sore throat  Neurologic: No dizziness, blackouts, seizures. No recent symptoms of stroke or mini- stroke. No recent episodes of slurred speech, or temporary blindness.  Cardiac: No recent episodes of chest pain/pressure, no shortness of breath at rest.  No shortness of breath with exertion.  Denies history of atrial fibrillation or irregular heartbeat  Vascular: No history of rest pain in feet.  No history of claudication.  No history of non-healing ulcer, No history of DVT   Pulmonary: No home oxygen, no productive cough, no hemoptysis,  No asthma or wheezing  Musculoskeletal:  [ ]  Arthritis, [x ] Low back pain,  [ ]  Joint pain  Hematologic:No history of hypercoagulable state.  No history of easy bleeding.  No history of anemia  Gastrointestinal: No hematochezia or melena,  No gastroesophageal reflux, no trouble swallowing  Urinary: [ ]  chronic Kidney disease, [ ]  on HD - [ ]  MWF or [ ]  TTHS, [ ]  Burning with urination, [ ]  Frequent urination, [ ]  Difficulty urinating;   Skin: No rashes  Psychological: No history of anxiety,  No history of depression   Physical Examination  Vitals:   06/05/21 1414  06/05/21 1419  BP: 134/70 139/73  Pulse: 84   Resp: 20   Temp: (!) 97.3 F (36.3 C)   SpO2: 98%   Weight: 120 lb (54.4 kg)   Height: 5\' 5"  (1.651 m)     Body mass index is 19.97 kg/m.  General:  Alert and oriented, no acute distress HEENT: Normal Neck: + right carotid  bruit or JVD Pulmonary: Clear to  auscultation bilaterally Cardiac: Regular Rate and Rhythm without murmur Gastrointestinal: Soft, non-tender, non-distended, no mass, no scars Skin: No rash Extremity Pulses:  2+ right 1+ left radial, brachial, femoral, dorsalis pedis,  pulses bilaterally Musculoskeletal: No deformity or edema  Neurologic: Upper and lower extremity motor 5/5 and symmetric  DATA:      Right Carotid Findings:  +----------+--------+--------+--------+------------------+--------+            PSV cm/sEDV cm/sStenosisPlaque DescriptionComments  +----------+--------+--------+--------+------------------+--------+  CCA Prox  91      13                                          +----------+--------+--------+--------+------------------+--------+  CCA Mid   94      18                                          +----------+--------+--------+--------+------------------+--------+  CCA Distal80      21              heterogenous                +----------+--------+--------+--------+------------------+--------+  ICA Prox  238     57      40-59%  heterogenous                +----------+--------+--------+--------+------------------+--------+  ICA Mid   143     30                                          +----------+--------+--------+--------+------------------+--------+  ICA Distal120     33                                          +----------+--------+--------+--------+------------------+--------+  ECA       162     17                                          +----------+--------+--------+--------+------------------+--------+    +----------+--------+-------+----------------+-------------------+            PSV cm/sEDV cmsDescribe        Arm Pressure (mmHG)  +----------+--------+-------+----------------+-------------------+  [id number]     7      Multiphasic, WNL                     +----------+--------+-------+----------------+-------------------+   +---------+--------+---+--------+--+---------+  VertebralPSV cm/s116EDV cm/s21Antegrade  +---------+--------+---+--------+--+---------+       Left Carotid Findings:  +----------+--------+--------+--------+-------------------------+--------+            PSV cm/sEDV cm/sStenosisPlaque Description       Comments  +----------+--------+--------+--------+-------------------------+--------+  CCA Prox  72      26              heterogenous and calcific          +----------+--------+--------+--------+-------------------------+--------+  CCA Mid   66      29              calcific and heterogenous          +----------+--------+--------+--------+-------------------------+--------+  CCA Distal52      24              heterogenous                       +----------+--------+--------+--------+-------------------------+--------+  ICA Prox  43      15      1-39%   calcific                           +----------+--------+--------+--------+-------------------------+--------+  ICA Mid   48      20                                                 +----------+--------+--------+--------+-------------------------+--------+  ICA Distal47      20                                                 +----------+--------+--------+--------+-------------------------+--------+  ECA       71      12                                                 +----------+--------+--------+--------+-------------------------+--------+   +----------+--------+--------+--------+-------------------+            PSV cm/sEDV cm/sDescribeArm  Pressure (mmHG)  +----------+--------+--------+--------+-------------------+  Subclavian128     5       dampened                     +----------+--------+--------+--------+-------------------+   +---------+--------+--+--------+----------+  VertebralPSV cm/s91EDV cm/sRetrograde  +---------+--------+--+--------+----------+    Summary:  Right Carotid: Velocities in the right ICA are consistent with a 40-59%                 stenosis.   Left Carotid: Velocities in the left ICA are consistent with a 1-39%  stenosis.                Velocity may be underestimated due to more proximal  stenosis.   Vertebrals: Right vertebral artery demonstrates antegrade flow. Left  vertebral              artery demonstrates retrograde flow. Left subclavian artery              dampened.   ASSESSMENT:  65 y.o. female here for follow up for left carotid subclavian bypass in 2003 for left arm claudication Her carotid duplex is unchanged from previous exam and she remains asymptomatic.  PLAN:F/U in 1 year for repeat carotid duplex and ABI's.  discussed s/s of stroke/TIA and if pt develops any sx, they know to call 911 or go to the emergency room.  She is working on soking cessation. Activity as tolerates.    Mosetta Pigeon PA-C [facility] Office: [phone]  MD on call Edilia Bo

## 2021-07-03 ENCOUNTER — Ambulatory Visit
Admission: RE | Admit: 2021-07-03 | Discharge: 2021-07-03 | Disposition: A | Discharge status: Home / Self Care | Payer: Medicare Other | Source: Ambulatory Visit | Attending: Family Medicine | Admitting: Family Medicine

## 2021-07-03 DIAGNOSIS — Z1231 Encounter for screening mammogram for malignant neoplasm of breast: Secondary | ICD-10-CM

## 2021-07-09 NOTE — Progress Notes (Signed)
Office Visit    Patient Name: Danielle Farrell Date of Encounter: 07/10/2021  Primary Care Provider:  Kaleen MaskElkins, Wilson Oliver, MD Primary Cardiologist:  Bryan Lemmaavid Harding, MD  Chief Complaint    66 year old female with a history of CAD s/p CABG x 4 (2002), PAD aortobifem bypass, carotid artery stenosis s/p left carotid-subclavian bypass, Ehlers-Danlos, hyperlipidemia, chronic tobacco use, type 2 diabetes who presents for follow-up related to CAD.  Past Medical History    Past Medical History:  Diagnosis Date   Bronchitis Jan 2014   became pneumonia   Carotid bruit    Coronary artery disease involving left main coronary artery 04/2001   Catheter distal LM 50-60%-extending into 50% ostial LCx and 80-90% ostial LAD.  Mid LAD 50-60% and mid RCA 50%.--Referred for CABG x4 (free LIMA-LAD with SVG button, pedicle RIMA-RCA, SVG-D1, SVG-OM1) Dr. 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 2014   Stroke [facility](HCC)    Past Surgical History:  Procedure Laterality Date   AORTO-FEMORAL BYPASS GRAFT  02/2001   Dr. Hart RochesterLawson.   Ao-BiFem wioth L Renal A bypass.   CARDIOVASCULAR STRESS TEST  11/23/2006   EF 64%   CAROTID-SUBCLAVIAN BYPASS GRAFT Left 08/2001   Dr. Hart RochesterLawson -> Total occlusion of proximal subclavian up to 1 cm proximal to vertebral artery; subclavian artery transected and endarterectomy performed prior to anastomosis onto the common carotid artery, proximal subclavian stump oversewn   CATARACT EXTRACTION Right 09/2000   CORONARY ARTERY BYPASS GRAFT  05/03/2001   Dr. Cornelius Moraswen; x 4.   (free LIMA-LAD with SVG button, pedicle RIMA-RCA, SVG-D1, SVG-OM1)   LEFT HEART CATH AND CORONARY ANGIOGRAPHY  04/28/2001   Dr. Delane GingerPhil Nahser: performed for class IV angina (across the back) and abnormal Cardiolite: Distal LM 50-60% extending into 50% ostial LCx and ostial-proximal LAD 80-90%. Mid LAD 50-60%.  Moderate sized D1, OM 1 & OM 2. RCA proximal 50% with there is damping of catheter. Referred for CABG after proximal LAD lesion.   NM MYOVIEW LTD  10/20/2016   EF 65-70%.  Normal wall motion.  No ischemia or infarction.  LOW RISK   TRANSTHORACIC ECHOCARDIOGRAM  06/2013   EF 60-65%. No R WMA. GRII DD. Normal valves    Allergies  Allergies  Allergen Reactions   Erythromycin Nausea And Vomiting and Swelling    Throw up   Zantac [Ranitidine Hcl] Hives, Itching and Swelling    Facial swelling   Farxiga [Dapagliflozin] Hives   Adhesive [Tape] Rash    pls try paper tape   Lipitor [Atorvastatin Calcium] Other (See Comments)    Muscle cramps in legs    History of Present Illness      66 year old female with above past medical history including CAD s/p CABG x 4 (2002), PAD s/p aortobifem bypass, carotid artery stenosis s/p left carotid-subclavian bypass, Ehlers-Danlos, hyperlipidemia, chronic tobacco use, type 2 diabetes.  Cardiac cath in 2002 showed multivessel CAD.  She underwent CABG with free LIMA-LAD with SVG button, pedicle RIMA-RCA,  SVG-D1, SVG-OM1.  She also has a history of PAD and underwent aortobifemoral bypass prior to her CABG in 2002.  She subsequently underwent left carotid subclavian bypass for carotid artery stenosis in 2003. Echocardiogram in 2014 showed an EF of 60 to 65%, no regional wall motion abnormalities, G2DD, trivial MR, mild LAE.  Myoview in April 2018 was nonischemic. She was last seen in the office in April 2021 and had started smoking again. Otherwise, she was doing well overall from a cardiac standpoint.  Saw vascular surgery in December 2022.  Repeat carotid  Dopplers 06/2021 showed 40 to 59% R ICA stenosis, 1 to 39% LICA stenosis.   She presents today for routine follow-up.  Since her last visit she has done well overall from a cardiac standpoint.  She denies any symptoms of angina, claudication.  She does report mild dizziness with position changes at times.  She is bradycardic today, HR 47 bpm. She is on metoprolol tartrate 25 mg twice daily. Baseline heart rate at prior visits approximately 60 bpm.  She also complains of persistent headaches.  She is following with her primary care doctor and neurology for this, and has a visit with the eye doctor soon for same. She is unsure whether or not her headaches are related to tension, DDD of C-spine, or something else.  She continues to smoke. Otherwise, she has no concerns or complaints today. Home Medications    Current Outpatient Medications  Medication Sig Dispense Refill   aspirin 81 MG EC tablet Take 81 mg by mouth daily after supper.     glipiZIDE (GLUCOTROL XL) 10 MG 24 hr tablet Take 20 mg by mouth daily.     lisinopril (ZESTRIL) 30 MG tablet Take 15 mg by mouth 2 (two) times daily before a meal.     metFORMIN (GLUCOPHAGE) 1000 MG tablet Take 1,000 mg by mouth See admin instructions. Take 1 tablet (1000 mg) by mouth daily before breakfast and 1 by mouth daily before supper     metoprolol tartrate (LOPRESSOR) 25 MG tablet Take 0.5 tablets (12.5 mg total) by mouth 2 (two) times daily. 90 tablet 3   PARoxetine (PAXIL) 10 MG tablet Take 10 mg by mouth daily before breakfast.      rosuvastatin (CRESTOR) 10 MG tablet Take 10 mg by mouth at bedtime. Take 10 mg tablet by mouth daily     vitamin B-12 (CYANOCOBALAMIN) 1000 MCG tablet Take 1,000 mcg by mouth daily before breakfast.     No current facility-administered medications for this visit.     Review of Systems    She denies chest pain, palpitations, dyspnea, pnd, orthopnea, claudication, n, v, dizziness, syncope, edema, weight gain, or early  satiety. All other systems reviewed and are otherwise negative except as noted above.   Physical Exam    VS:  BP 110/70 (BP Location: Left Arm)    Pulse (!) 47    Ht 5\' 4"  (1.626 m)    Wt 119 lb 6.4 oz (54.2 kg)    SpO2 98%    BMI 20.49 kg/m   GEN: Well nourished, well developed, in no acute distress. HEENT: normal. Neck: Supple, no JVD, carotid bruits, or masses. Cardiac: RRR, no murmurs, rubs, or gallops. No clubbing, cyanosis, edema.  Radials/DP/PT 2+ and equal bilaterally.  Bilateral carotid bruits present on exam today. Respiratory:  Respirations regular and unlabored, clear to auscultation bilaterally. GI: Soft, nontender, nondistended, BS + x 4. MS: no deformity or atrophy. Skin: warm and dry, no  rash. Neuro:  Strength and sensation are intact. Psych: Normal affect.  Accessory Clinical Findings    ECG personally reviewed by me today - Sinus bradycardia with sinus arrhythmia, 47 bpm, incomplete RBBB, non-specific t wave abnormality - no acute changes.  Lab Results  Component Value Date   WBC 5.1 06/13/2020   HGB 12.7 06/13/2020   HCT 37.2 06/13/2020   MCV 85.1 06/13/2020   PLT 203 06/13/2020   Lab Results  Component Value Date   CREATININE 0.79 06/13/2020   BUN 14 06/13/2020   NA 130 (L) 06/13/2020   K 3.7 06/13/2020   CL 99 06/13/2020   CO2 18 (L) 06/13/2020   Lab Results  Component Value Date   ALT 12 06/13/2020   AST 24 06/13/2020   ALKPHOS 47 06/13/2020   BILITOT 0.3 06/13/2020   Lab Results  Component Value Date   CHOL 164 05/18/2012   HDL 50.70 05/18/2012   LDLCALC 99 05/18/2012   TRIG 74.0 05/18/2012   CHOLHDL 3 05/18/2012    Lab Results  Component Value Date   HGBA1C 7.5 (H) 05/18/2012    Assessment & Plan    1. CAD s/p CABG x4: S/p CABG x4 2002. Myoview in April 2018 was nonischemic. Stable with no anginal symptoms. No indication for ischemic evaluation.  Continue aspirin, Crestor, metoprolol as below, lisinopril.  2. Bradycardia: Heart  rate 47 bpm on EKG today.  EKG from prior visits show heart rate in the 60s.  She does take metoprolol 25 mg twice daily. Given new bradycardia, I will decrease her metoprolol today to 12.5 mg twice daily. I will have her closely follow her BP and HR and report blood pressure consistently greater than 130/80, HR> 100, or <50.  If heart rate or blood pressure become elevated with dose reduction, can consider further titration of current medications.   3. Hypertension: BP well controlled. Continue current antihypertensive regimen, with reduction in metoprolol as above.    4. Grade 2 diastolic dysfunction:  Echocardiogram in 2014 showed an EF of 60 to 65%, no regional wall motion abnormalities, G2DD, trivial MR, mild LAE. Euvolemic and well compensated on exam.  Medications as above.  5. Hyperlipidemia: Monitored and managed by PCP.  No recent lipid profile available on file.  She thinks that her PCP drew her cholesterol recently and that everything was "normal."  I have requested that she have her PCP send most recent lipid panel to [place] for review.  No recent lipid profile, recommend repeat lipids given history of CAD, PAD, and carotid artery disease.  Continue ASA, statin.  6. PAD: S/p aortobifemoral bypass in 2002 prior to CABG. Denies claudication. Continue ASA, statin.   7. Carotid artery disease: S/p left carotid subclavian bypass in 2003. Followed by vascular surgery. Repeat carotid Dopplers 06/2021 showed 40 to 59% R ICA stenosis, 1 to 39% LICA stenosis. No bruit on exam today.  Continue aspirin, statin.  8. Tobacco use: Smoking cessation advised.   9. Disposition: Follow-up in 1 year.   Joylene Grapes, NP 07/10/2021, 1:31 PM

## 2021-07-10 ENCOUNTER — Encounter: Payer: Self-pay | Admitting: Adult Health

## 2021-07-10 ENCOUNTER — Ambulatory Visit: Payer: Medicare Other | Admitting: Nurse Practitioner

## 2021-07-10 ENCOUNTER — Other Ambulatory Visit: Payer: Self-pay

## 2021-07-10 VITALS — BP 110/70 | HR 47 | Ht 64.0 in | Wt 119.4 lb

## 2021-07-10 DIAGNOSIS — I2510 Atherosclerotic heart disease of native coronary artery without angina pectoris: Secondary | ICD-10-CM

## 2021-07-10 DIAGNOSIS — R001 Bradycardia, unspecified: Secondary | ICD-10-CM | POA: Diagnosis not present

## 2021-07-10 DIAGNOSIS — I10 Essential (primary) hypertension: Secondary | ICD-10-CM | POA: Diagnosis not present

## 2021-07-10 DIAGNOSIS — I5189 Other ill-defined heart diseases: Secondary | ICD-10-CM

## 2021-07-10 DIAGNOSIS — I6523 Occlusion and stenosis of bilateral carotid arteries: Secondary | ICD-10-CM

## 2021-07-10 DIAGNOSIS — I739 Peripheral vascular disease, unspecified: Secondary | ICD-10-CM

## 2021-07-10 DIAGNOSIS — E785 Hyperlipidemia, unspecified: Secondary | ICD-10-CM | POA: Diagnosis not present

## 2021-07-10 DIAGNOSIS — Z951 Presence of aortocoronary bypass graft: Secondary | ICD-10-CM

## 2021-07-10 DIAGNOSIS — Z720 Tobacco use: Secondary | ICD-10-CM

## 2021-07-10 MED ORDER — METOPROLOL TARTRATE 25 MG PO TABS: 12.5000 mg | ORAL_TABLET | Freq: Two times a day (BID) | ORAL | 3 refills | Status: DC

## 2021-07-10 NOTE — Patient Instructions (Signed)
Medication Instructions:  Decrease Metoprolol to 12.5 mg ( Take 0.5 Tablet Twice Daily). *If you need a refill on your cardiac medications before your next appointment, please call your pharmacy*   Lab Work: No Labs If you have labs (blood work) drawn today and your tests are completely normal, you will receive your results only by: MyChart Message (if you have MyChart) OR A paper copy in the mail If you have any lab test that is abnormal or we need to change your treatment, we will call you to review the results.   Testing/Procedures: No Testing   Follow-Up: At [facility], you and your health needs are our priority.  As part of our continuing mission to provide you with exceptional heart care, we have created designated Provider Care Teams.  These Care Teams include your primary Cardiologist (physician) and Advanced Practice Providers (APPs -  Physician Assistants and Nurse Practitioners) who all work together to provide you with the care you need, when you need it.  We recommend signing up for the patient portal called "MyChart".  Sign up information is provided on this After Visit Summary.  MyChart is used to connect with patients for Virtual Visits (Telemedicine).  Patients are able to view lab/test results, encounter notes, upcoming appointments, etc.  Non-urgent messages can be sent to your provider as well.   To learn more about what you can do with MyChart, go to [email or web address].    Your next appointment:   1 year(s)  The format for your next appointment:   In Person  Provider:   Bryan Lemma, MD     Other Instructions Monitor Blood Pressure and Heart Rate. Please call our office if Blood Pressure is consistently 130/80 and heart Rate  consistently above 100 Beats per minute and Lower that 50 Beats Per Minute.  Please call Primary Care Provider to fax copy of Most recent Labs. ( Lipid panel and Liver Function.).

## 2021-08-03 IMAGING — DX DG CHEST 1V PORT
1 series · 1 of 1 positions shown · non-contrast
Comparison: Prior radiograph from 04/06/2019.

CLINICAL DATA: Initial evaluation for acute cough.

EXAM:
PORTABLE CHEST 1 VIEW

[chest]
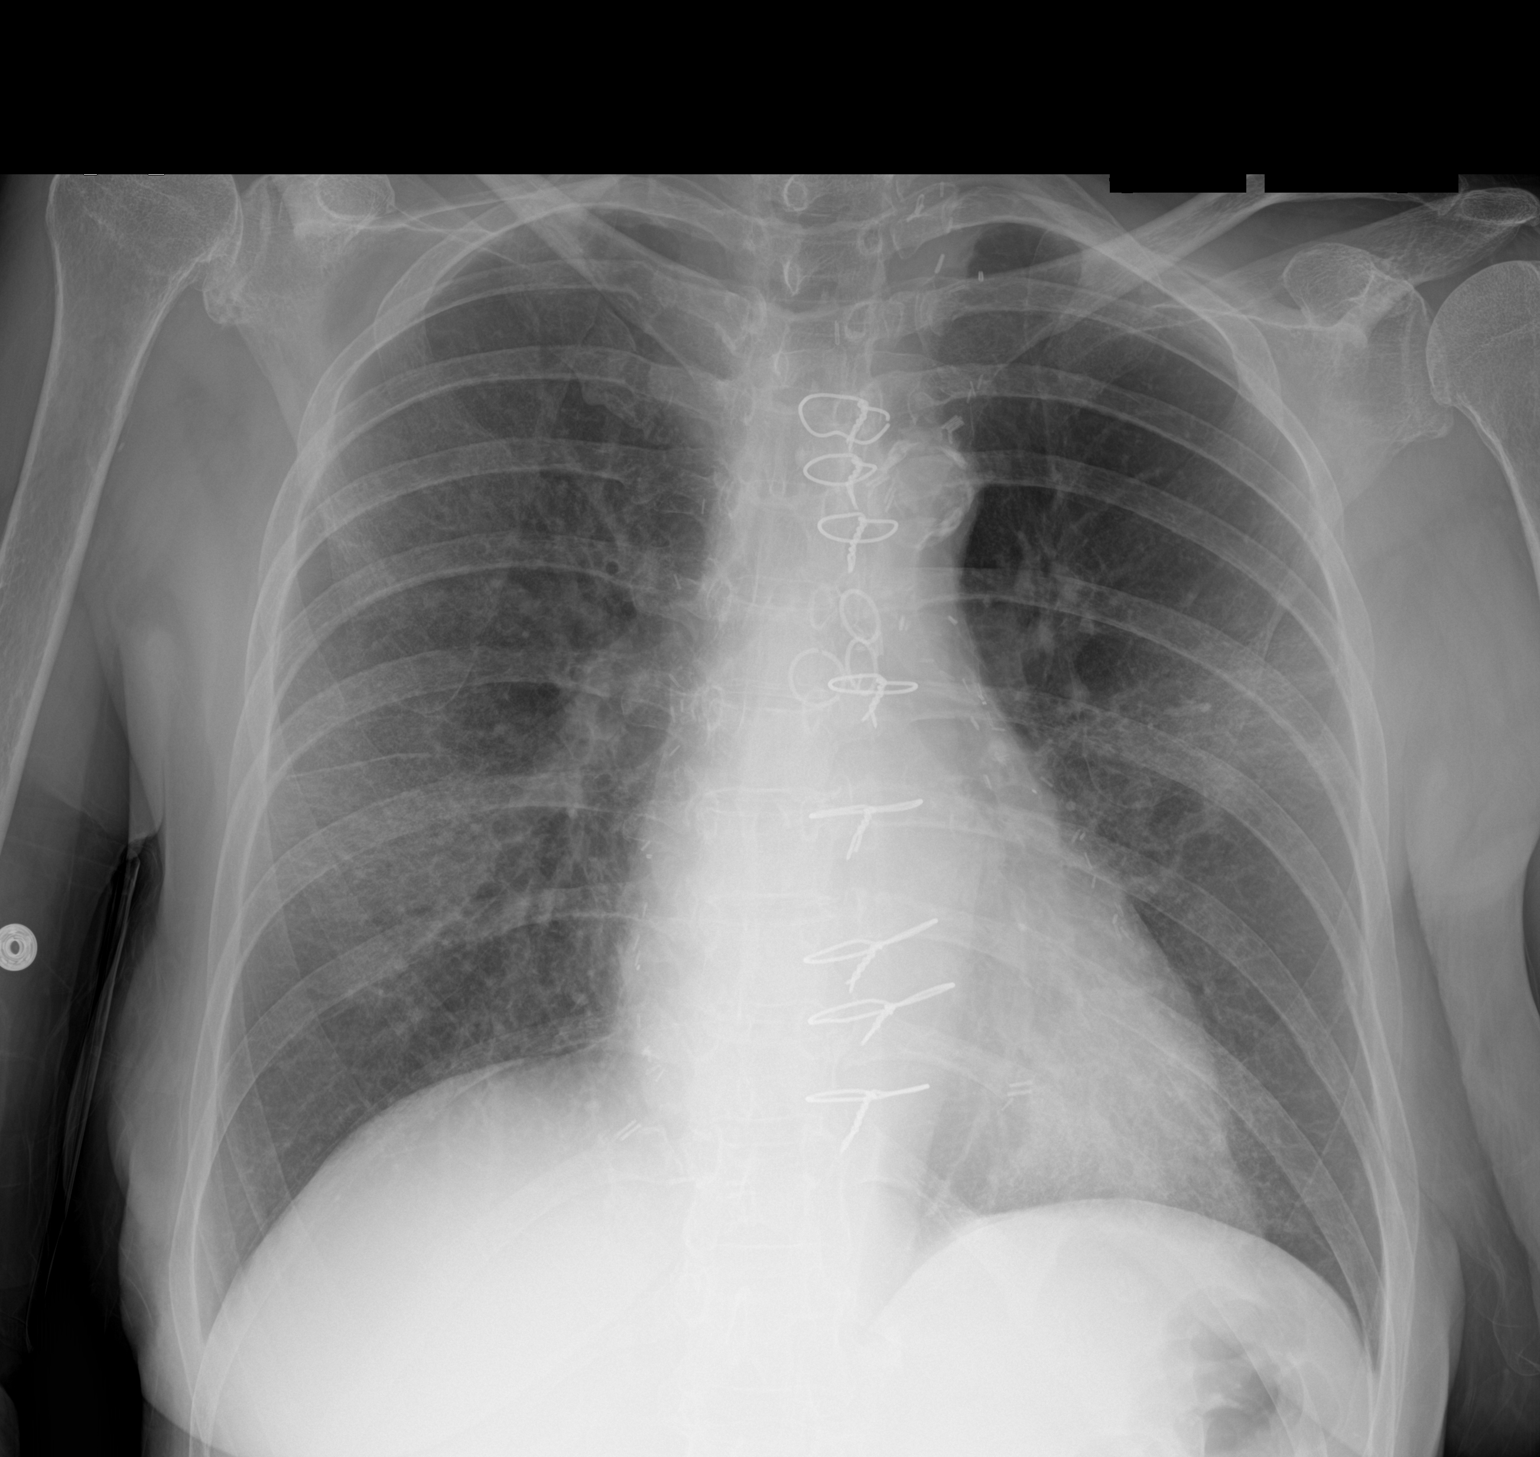

[1 of 1 positions shown; findings below may reference images not displayed]

FINDINGS: Median sternotomy wires with underlying CABG markers and surgical
clips noted. Transverse heart size is stable, and remains within
normal limits. Mediastinal silhouette normal. Aortic
atherosclerosis.

Lungs normally inflated. Hazy infiltrate seen within the left
perihilar region/left mid lung, suspicious for pneumonia given the
provided history of cough. Underlying peribronchial thickening
elsewhere within the lungs. No edema or effusion. No pneumothorax.

No acute osseous finding. Degenerative changes noted about the
shoulders.
IMPRESSION: 1. Hazy infiltrate within the left perihilar region/left mid lung,
suspicious for pneumonia given the provided history of cough.
2.  Aortic Atherosclerosis ([id number]).

## 2021-12-30 NOTE — Progress Notes (Signed)
Cardiology Clinic Note   Patient Name: Danielle Farrell Date of Encounter: 01/01/2022  Primary Care Provider:  Kaleen Mask, MD Primary Cardiologist:  Bryan Lemma, MD  Patient Profile    66 year old female with history of coronary artery disease status post CABG x4 in 2002 (3 LIMA to LAD, SVG button, pedicle RIMA to RCA, SVG to diagonal 1, SVG to the OM1), peripheral arterial disease status post aortofemoral bypass, carotid artery disease status post left carotid subclavian bypass, Ehlers-Danlos, hyperlipidemia, HTN,  chronic tobacco abuse, type 2 diabetes.    Last seen in the office on 07/10/2021 by Danielle Farrell, Danielle Farrell.  At that time, the patient was bradycardic with a heart rate of 47 bpm per EKG.  Metoprolol was decreased from 25 mg twice daily to 12.5 mg twice daily.  It was noted that hyperlipidemia was managed by PCP,  she was again, advised on smoking cessation.   Past Medical History    Past Medical History:  Diagnosis Date   Bronchitis Jan 2014   became pneumonia   Carotid bruit    Coronary artery disease involving left main coronary artery 04/2001   Catheter distal LM 50-60%-extending into 50% ostial LCx and 80-90% ostial LAD.  Mid LAD 50-60% and mid RCA 50%.--Referred for CABG x4 (free LIMA-LAD with SVG button, pedicle RIMA-RCA, SVG-D1, SVG-OM1) Dr. 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 2014   Stroke [facility])    Past Surgical History:  Procedure Laterality Date   AORTO-FEMORAL BYPASS GRAFT  02/2001   Dr. Hart [place].   Ao-BiFem wioth L Renal A bypass.    CARDIOVASCULAR STRESS TEST  11/23/2006   EF 64%   CAROTID-SUBCLAVIAN BYPASS GRAFT Left 08/2001   Dr. Hart [place] -> Total occlusion of proximal subclavian up to 1 cm proximal to vertebral artery; subclavian artery transected and endarterectomy performed prior to anastomosis onto the common carotid artery, proximal subclavian stump oversewn   CATARACT EXTRACTION Right 09/2000   CORONARY ARTERY BYPASS GRAFT  05/03/2001   Dr. Cornelius Moras; x 4.  (free LIMA-LAD with SVG button, pedicle RIMA-RCA, SVG-D1, SVG-OM1)   LEFT HEART CATH AND CORONARY ANGIOGRAPHY  04/28/2001   Dr. Delane Ginger: performed for class IV angina (across the back) and abnormal Cardiolite: Distal LM 50-60% extending into 50% ostial LCx and ostial-proximal LAD 80-90%. Mid LAD 50-60%.  Moderate sized D1, OM 1 & OM 2. RCA proximal 50% with there is damping of catheter. Referred for CABG after proximal LAD lesion.   NM MYOVIEW LTD  10/20/2016   EF 65-70%.  Normal wall motion.  No ischemia or infarction.  LOW RISK   TRANSTHORACIC ECHOCARDIOGRAM  06/2013   EF 60-65%. No R WMA. GRII DD. Normal valves    Allergies  Allergies  Allergen Reactions   Erythromycin Nausea And Vomiting and Swelling    Throw up   Zantac [Ranitidine Hcl] Hives, Itching and Swelling    Facial swelling   Farxiga [Dapagliflozin] Hives   Adhesive [Tape] Rash    pls  try paper tape   Lipitor [Atorvastatin Calcium] Other (See Comments)    Muscle cramps in legs    History of Present Illness    [name] returns today for ongoing assessment and management of coronary artery disease, peripheral arterial disease, carotid artery disease, hyperlipidemia, bradycardia, and hypertension.  On last office visit she was found to be bradycardic with a heart rate of 47 bpm at which time metoprolol was decreased from 25 mg twice daily to 12.5 mg twice daily.    Since being seen last she has been seen by PCP who is discontinued metoprolol due to hypotension.  She is also been started  on [place] by PCP.  Since stopping metoprolol the patient has rapid palpitations at at bedtime.  She denies excessive caffeine use.  She is otherwise doing very well.  She states that she is staying hydrated.  Home Medications    Current Outpatient Medications  Medication Sig Dispense Refill   aspirin 81 MG EC tablet Take 81 mg by mouth daily after supper.     dapagliflozin propanediol (FARXIGA) 10 MG TABS tablet Take 10 mg by mouth daily. Samples     glipiZIDE (GLUCOTROL XL) 10 MG 24 hr tablet Take 20 mg by mouth 2 (two) times daily.     lisinopril (ZESTRIL) 30 MG tablet Take 15 mg by mouth 2 (two) times daily before a meal.     metFORMIN (GLUCOPHAGE) 1000 MG tablet Take 1,000 mg by mouth See admin instructions. Take 1 tablet (1000 mg) by mouth daily before breakfast and 1 by mouth daily before supper     PARoxetine (PAXIL) 10 MG tablet Take 10 mg by mouth daily before breakfast.      rosuvastatin (CRESTOR) 10 MG tablet Take 10 mg by mouth at bedtime. Take 10 mg tablet by mouth daily     metoprolol tartrate (LOPRESSOR) 25 MG tablet Take 0.5 tablets (12.5 mg total) by mouth as needed. For Rapid Heart Rythm 30 tablet 2   vitamin B-12 (CYANOCOBALAMIN) 1000 MCG tablet Take 1,000 mcg by mouth daily before breakfast. (Patient not taking: Reported on 01/01/2022)     No current facility-administered medications for this visit.     Family History    Family History  Problem Relation Age of Onset   Cancer Mother        Uterain   Cancer Father        Prostate   Heart attack Paternal Grandfather    Heart attack Brother 4       Cardiac Infraction   Breast cancer Neg Hx    She indicated that her mother is deceased. She indicated that her father is deceased. She indicated that her brother is deceased. She indicated that her paternal grandfather is deceased. She indicated that the status of her neg hx is unknown.  Social History    Social History   Socioeconomic History   Marital status:  Married    Spouse name: Jori Moll   Number of children: 0   Years of education: 12   Highest education level: Not on file  Occupational History    Employer: DISABLED    Comment: Disabled  Tobacco Use   Smoking status: Every Day    Packs/day: 0.50    Years: 43.00    Total pack years: 21.50    Types: Cigarettes   Smokeless tobacco: Never  Vaping Use   Vaping Use: Never used  Substance and Sexual Activity   Alcohol use: No    Alcohol/week:  0.0 standard drinks of alcohol   Drug use: No   Sexual activity: Not Currently  Other Topics Concern   Not on file  Social History Narrative   Patient lives at home with her husband [name]).   Disabled.   Right handed   Education 12 th grade   Caffeine- One cup of coffee daily.   Social Determinants of Health   Financial Resource Strain: Not on file  Food Insecurity: Not on file  Transportation Needs: Not on file  Physical Activity: Not on file  Stress: Not on file  Social Connections: Not on file  Intimate Partner Violence: Not on file     Review of Systems    General:  No chills, fever, night sweats or weight changes.  Cardiovascular:  No chest pain, dyspnea on exertion, edema, orthopnea, palpitations, paroxysmal nocturnal dyspnea. Dermatological: No rash, lesions/masses Respiratory: No cough, dyspnea Urologic: No hematuria, dysuria Abdominal:   No nausea, vomiting, diarrhea, bright red blood per rectum, melena, or hematemesis Neurologic:  No visual changes, wkns, changes in mental status. All other systems reviewed and are otherwise negative except as noted above.     Physical Exam    VS:  BP 100/70 (BP Location: Left Arm)   Pulse 70   Ht 5\' 4"  (1.626 m)   Wt 117 lb (53.1 kg)   SpO2 97%   BMI 20.08 kg/m  , BMI Body mass index is 20.08 kg/m.     GEN: Well nourished, well developed, in no acute distress. HEENT: normal. Neck: Supple, no JVD, carotid bruits, or masses. Cardiac: RRR, no murmurs, rubs, or gallops. No  clubbing, cyanosis, edema.  Radials/DP/PT 2+ and equal bilaterally.  Respiratory:  Respirations regular and unlabored, clear to auscultation bilaterally. GI: Soft, nontender, nondistended, BS + x 4. MS: no deformity or atrophy. Skin: warm and dry, no rash. Neuro:  Strength and sensation are intact. Psych: Normal affect.  Accessory Clinical Findings    ECG personally reviewed by me today-normal sinus rhythm, possible left atrial enlargement, heart rate 70 bpm.- No acute changes  Lab Results  Component Value Date   WBC 5.1 06/13/2020   HGB 12.7 06/13/2020   HCT 37.2 06/13/2020   MCV 85.1 06/13/2020   PLT 203 06/13/2020   Lab Results  Component Value Date   CREATININE 0.79 06/13/2020   BUN 14 06/13/2020   NA 130 (L) 06/13/2020   K 3.7 06/13/2020   CL 99 06/13/2020   CO2 18 (L) 06/13/2020   Lab Results  Component Value Date   ALT 12 06/13/2020   AST 24 06/13/2020   ALKPHOS 47 06/13/2020   BILITOT 0.3 06/13/2020   Lab Results  Component Value Date   CHOL 164 05/18/2012   HDL 50.70 05/18/2012   LDLCALC 99 05/18/2012   TRIG 74.0 05/18/2012   CHOLHDL 3 05/18/2012    Lab Results  Component Value Date   HGBA1C 7.5 (H) 05/18/2012    Review of Prior Studies: Carotid Ultrasound 06/05/21 Right Carotid: Velocities in the right ICA are consistent with a 40-59%                 stenosis.  Left Carotid: Velocities in the left ICA are consistent with a 1-39%  stenosis. Velocity may be underestimated due to more proximal  stenosis.  Vertebrals: Right vertebral artery demonstrates antegrade flow. Left  vertebral artery demonstrates retrograde flow. Left subclavian artery              dampened.  Assessment & Plan   1.  Rapid palpitations: These have recurred with discontinuation of metoprolol.  I have advised her to take 12.5 mg of metoprolol tartrate as needed rapid heart rate at at bedtime only.  If she finds that she is having to take a dose each evening, she will need to  call us.  For now she will only uses as needed due to hypotension.  2.  Hyperlipidemia: She continues on rosuvastatin 10 mg at at bedtime.  Goal of LDL less than 70 in a diabetic patient.  3.  Hypertension: Continues on lisinopril 15 mg twice daily.  May need to decrease this to 10 mg should she remained hypotensive despite stopping metoprolol daily, and starting on Farxiga which can be dehydrating.  She is to follow-up closely with PCP after starting [place].  And would recommend down titration to keep blood pressure greater than 100/60.  Current medicines are reviewed at length with the patient today.  I have spent 25 min's  dedicated to the care of this patient on the date of this encounter to include pre-visit review of records, assessment, management and diagnostic testing,with shared decision making. Signed, Phill Myron. West Pugh, ANP, AACC   01/01/2022 1:03 PM    [facility] [place] [address] Office [phone] Fax [phone]  Notice: This dictation was prepared with Dragon dictation along with smaller phrase technology. Any transcriptional errors that result from this process are unintentional and may not be corrected upon review.

## 2022-01-01 ENCOUNTER — Encounter: Payer: Self-pay | Admitting: Adult Health

## 2022-01-01 ENCOUNTER — Ambulatory Visit: Payer: Medicare Other | Admitting: Adult Health

## 2022-01-01 VITALS — BP 100/70 | HR 70 | Ht 64.0 in | Wt 117.0 lb

## 2022-01-01 DIAGNOSIS — E1165 Type 2 diabetes mellitus with hyperglycemia: Secondary | ICD-10-CM

## 2022-01-01 DIAGNOSIS — I358 Other nonrheumatic aortic valve disorders: Secondary | ICD-10-CM | POA: Diagnosis not present

## 2022-01-01 DIAGNOSIS — I10 Essential (primary) hypertension: Secondary | ICD-10-CM | POA: Diagnosis not present

## 2022-01-01 DIAGNOSIS — E7800 Pure hypercholesterolemia, unspecified: Secondary | ICD-10-CM

## 2022-01-01 MED ORDER — METOPROLOL TARTRATE 25 MG PO TABS: 12.5000 mg | ORAL_TABLET | Freq: Two times a day (BID) | ORAL | 2 refills | Status: DC

## 2022-01-01 MED ORDER — METOPROLOL TARTRATE 25 MG PO TABS: 12.5000 mg | ORAL_TABLET | ORAL | 2 refills | Status: DC | PRN

## 2022-01-01 NOTE — Patient Instructions (Signed)
Medication Instructions:  Restart Metoprolol Tartrate  25 mg ( Take Half Tablet Twice Daily As Needed.) *If you need a refill on your cardiac medications before your next appointment, please call your pharmacy*   Lab Work: No Labs If you have labs (blood work) drawn today and your tests are completely normal, you will receive your results only by: MyChart Message (if you have MyChart) OR A paper copy in the mail If you have any lab test that is abnormal or we need to change your treatment, we will call you to review the results.   Testing/Procedures: [address], [address]. Your physician has requested that you have an echocardiogram. Echocardiography is a painless test that uses sound waves to create images of your heart. It provides your doctor with information about the size and shape of your heart and how well your heart's chambers and valves are working. This procedure takes approximately one hour. There are no restrictions for this procedure.    Follow-Up: At [facility], you and your health needs are our priority.  As part of our continuing mission to provide you with exceptional heart care, we have created designated Provider Care Teams.  These Care Teams include your primary Cardiologist (physician) and Advanced Practice Providers (APPs -  Physician Assistants and Nurse Practitioners) who all work together to provide you with the care you need, when you need it.  We recommend signing up for the patient portal called "MyChart".  Sign up information is provided on this After Visit Summary.  MyChart is used to connect with patients for Virtual Visits (Telemedicine).  Patients are able to view lab/test results, encounter notes, upcoming appointments, etc.  Non-urgent messages can be sent to your provider as well.   To learn more about what you can do with MyChart, go to [email or web address].    Your next appointment:   6 month(s)  The format for your next  appointment:   In Person  Provider:   Bryan Lemma, MD       Important Information About Sugar

## 2022-01-29 ENCOUNTER — Ambulatory Visit (HOSPITAL_COMMUNITY): Payer: Medicare Other | Attending: Internal Medicine

## 2022-01-29 DIAGNOSIS — I10 Essential (primary) hypertension: Secondary | ICD-10-CM | POA: Diagnosis present

## 2022-01-29 DIAGNOSIS — I358 Other nonrheumatic aortic valve disorders: Secondary | ICD-10-CM | POA: Diagnosis present

## 2022-01-29 LAB — ECHOCARDIOGRAM COMPLETE
Area-P 1/2: 3.34 cm2
S' Lateral: 2.6 cm

## 2022-06-18 ENCOUNTER — Ambulatory Visit: Payer: Medicare Other | Attending: Cardiology | Admitting: Cardiology

## 2022-06-18 ENCOUNTER — Encounter: Payer: Self-pay | Admitting: Cardiology

## 2022-06-18 VITALS — BP 92/70 | HR 73 | Ht 64.0 in | Wt 117.0 lb

## 2022-06-18 DIAGNOSIS — E785 Hyperlipidemia, unspecified: Secondary | ICD-10-CM

## 2022-06-18 DIAGNOSIS — R42 Dizziness and giddiness: Secondary | ICD-10-CM

## 2022-06-18 DIAGNOSIS — I6521 Occlusion and stenosis of right carotid artery: Secondary | ICD-10-CM

## 2022-06-18 DIAGNOSIS — I25119 Atherosclerotic heart disease of native coronary artery with unspecified angina pectoris: Secondary | ICD-10-CM | POA: Diagnosis not present

## 2022-06-18 DIAGNOSIS — E1169 Type 2 diabetes mellitus with other specified complication: Secondary | ICD-10-CM

## 2022-06-18 DIAGNOSIS — I119 Hypertensive heart disease without heart failure: Secondary | ICD-10-CM | POA: Diagnosis not present

## 2022-06-18 DIAGNOSIS — Z951 Presence of aortocoronary bypass graft: Secondary | ICD-10-CM | POA: Diagnosis not present

## 2022-06-18 DIAGNOSIS — Z720 Tobacco use: Secondary | ICD-10-CM

## 2022-06-18 MED ORDER — LISINOPRIL 30 MG PO TABS: 15.0000 mg | ORAL_TABLET | Freq: Every day | ORAL | 3 refills | Status: DC

## 2022-06-18 NOTE — Patient Instructions (Signed)
Medication Instructions:    Change in taking Lisinopril  15 mg ( 1/2 tablet of 30 mg ) to once daily   *If you need a refill on your cardiac medications before your next appointment, please call your pharmacy*   Lab Work:fasting  PLEASE HAVE PRIMARY CHECK YOUR LIPID, CMP ,HGBA1c   If you have labs (blood work) drawn today and your tests are completely normal, you will receive your results only by: MyChart Message (if you have MyChart) OR A paper copy in the mail If you have any lab test that is abnormal or we need to change your treatment, we will call you to review the results.   Testing/Procedures:  Not needed  Follow-Up: At [facility], you and your health needs are our priority.  As part of our continuing mission to provide you with exceptional heart care, we have created designated Provider Care Teams.  These Care Teams include your primary Cardiologist (physician) and Advanced Practice Providers (APPs -  Physician Assistants and Nurse Practitioners) who all work together to provide you with the care you need, when you need it.     Your next appointment:   6 month(s)  The format for your next appointment:   In Person  Provider:   Joni Reining, DNP, ANP or Bernadene Person, NP    Then, Bryan Lemma, MD will plan to see you again in 12 month(s).

## 2022-06-18 NOTE — Progress Notes (Unsigned)
Primary Care Provider: Kaleen Mask, MD [facility] Cardiologist: Bryan Lemma, MD Electrophysiologist: None  Clinic Note: Chief Complaint  Patient presents with   Follow-up    6 months.    ===================================  ASSESSMENT/PLAN   Problem List Items Addressed This Visit       Cardiology Problems   Coronary artery disease involving native coronary artery of native heart with angina pectoris (HCC) - Primary (Chronic)   Hyperlipidemia associated with type 2 diabetes mellitus (HCC) (Chronic)   Carotid stenosis, asymptomatic   Benign hypertensive heart disease without heart failure (Chronic)     Other   Hx of CABG (Chronic)    ===================================  HPI:    Danielle Farrell is a 66 y.o. female with a PMH below who presents today for ***.  I last saw her in April 2021.  She has been seen twice this year by 2 different NP's.  [name] was last seen on ***  Recent Hospitalizations: ***  Reviewed  CV studies:    The following studies were reviewed today: (if available, images/films reviewed: From Epic Chart or Care Everywhere) Echo 01/2022:    1. Left ventricular ejection fraction, by estimation, is 60 to 65%. Left  ventricular ejection fraction by 3D volume is 62 %. The left ventricle has  normal function. The left ventricle has no regional wall motion  abnormalities. There is mild left  ventricular hypertrophy. Left ventricular diastolic parameters are  consistent with Grade I diastolic dysfunction (impaired relaxation). The  average left ventricular global longitudinal strain is -20.8 %. The global  longitudinal strain is normal.   2. Right ventricular systolic function is normal. The right ventricular  size is normal. There is normal pulmonary artery systolic pressure. The  estimated right ventricular systolic pressure is 27.4 mmHg.   3. The mitral valve is normal in structure. Trivial mitral valve  regurgitation.  No evidence of mitral stenosis.   4. The aortic valve is tricuspid. There is mild calcification of the  aortic valve. Aortic valve regurgitation is not visualized. No aortic  stenosis is present.   5. The inferior vena cava is normal in size with greater than 50%  respiratory variability, suggesting right atrial pressure of 3 mmHg.    Interval History:   CECILLA BUSSEY   CV Review of Symptoms (Summary): Cardiovascular ROS: {roscv:310661}  REVIEWED OF SYSTEMS   ROS  I have reviewed and (if needed) personally updated the patient's problem list, medications, allergies, past medical and surgical history, social and family history.   PAST MEDICAL HISTORY   Past Medical History:  Diagnosis Date   Bronchitis Jan 2014   became pneumonia   Carotid bruit    Coronary artery disease involving left main coronary artery 04/2001   Catheter distal LM 50-60%-extending into 50% ostial LCx and 80-90% ostial LAD.  Mid LAD 50-60% and mid RCA 50%.--Referred for CABG x4 (free LIMA-LAD with SVG button, pedicle RIMA-RCA, SVG-D1, SVG-OM1) Dr. Cornelius Moras   DDD (degenerative disc disease), cervical    Diabetes mellitus    Dizziness    Headache    Hyperlipidemia    Hypertension    IHD (ischemic heart disease)    Migraine headache    Severe Posterior Subluxation of C6  over C7 and C5 over C6 w/broad based disc protusion at C5-6  resulting in canal  narrowing   PAOD (peripheral arterial occlusive disease) (HCC) 01/2001   s/p AoBifem bypass & L Carotid-Subclavian Bypass   Persistent headaches  Pneumonia Jan. 2014   Stroke [facility])    2-D Echo December 2014: EF 60-65%. No R WMA. GRII DD. Normal valves Carotid and lower extremity ABI Cardiac cath May 2002 performed for class IV angina (across the back): Distal left main 50-60%. Very proximal LAD 80-90%. Mid LAD 50-60%. Circumflex 50% ostial involving the distal left main. There is OM 1 OM 2. RCA proximal 50% however there is damping of catheter. Referred for CABG  after proximal LAD lesion. CABG 4  04/2001: (free RIMA-LAD, RIMA-RCA, SVG-D1, SVG-OM1) and Dr. Cornelius Moras  PAST SURGICAL HISTORY   Past Surgical History:  Procedure Laterality Date   AORTO-FEMORAL BYPASS GRAFT  02/2001   Dr. Hart Rochester.   Ao-BiFem wioth L Renal A bypass.   CARDIOVASCULAR STRESS TEST  11/23/2006   EF 64%   CAROTID-SUBCLAVIAN BYPASS GRAFT Left 08/2001   Dr. Hart [place] -> Total occlusion of proximal subclavian up to 1 cm proximal to vertebral artery; subclavian artery transected and endarterectomy performed prior to anastomosis onto the common carotid artery, proximal subclavian stump oversewn   CATARACT EXTRACTION Right 09/2000   CORONARY ARTERY BYPASS GRAFT  05/03/2001   Dr. Cornelius Moras; x 4.  (free LIMA-LAD with SVG button, pedicle RIMA-RCA, SVG-D1, SVG-OM1)   LEFT HEART CATH AND CORONARY ANGIOGRAPHY  04/28/2001   Dr. Delane Ginger: performed for class IV angina (across the back) and abnormal Cardiolite: Distal LM 50-60% extending into 50% ostial LCx and ostial-proximal LAD 80-90%. Mid LAD 50-60%.  Moderate sized D1, OM 1 & OM 2. RCA proximal 50% with there is damping of catheter. Referred for CABG after proximal LAD lesion.   NM MYOVIEW LTD  10/20/2016   EF 65-70%.  Normal wall motion.  No ischemia or infarction.  LOW RISK   TRANSTHORACIC ECHOCARDIOGRAM  06/2013   EF 60-65%. No R WMA. GRII DD. Normal valves    Immunization History  Administered Date(s) Administered   Pneumococcal Polysaccharide-23 02/25/2013    MEDICATIONS/ALLERGIES   Current Meds  Medication Sig   aspirin 81 MG EC tablet Take 81 mg by mouth daily after supper.   dapagliflozin propanediol (FARXIGA) 10 MG TABS tablet Take 10 mg by mouth daily. Samples   glipiZIDE (GLUCOTROL XL) 10 MG 24 hr tablet Take 20 mg by mouth 2 (two) times daily.   lisinopril (ZESTRIL) 30 MG tablet Take 15 mg by mouth 2 (two) times daily before a meal.   metFORMIN (GLUCOPHAGE) 1000 MG tablet Take 1,000 mg by mouth 2 (two) times daily with a  meal.   PARoxetine (PAXIL) 10 MG tablet Take 10 mg by mouth daily before breakfast.    rosuvastatin (CRESTOR) 10 MG tablet Take 10 mg by mouth at bedtime. Take 10 mg tablet by mouth daily   [DISCONTINUED] vitamin B-12 (CYANOCOBALAMIN) 1000 MCG tablet Take 1,000 mcg by mouth daily before breakfast.    Allergies  Allergen Reactions   Erythromycin Nausea And Vomiting and Swelling    Throw up   Zantac [Ranitidine Hcl] Hives, Itching and Swelling    Facial swelling   Adhesive [Tape] Rash    pls try paper tape   Lipitor [Atorvastatin Calcium] Other (See Comments)    Muscle cramps in legs    SOCIAL HISTORY/FAMILY HISTORY   Reviewed in Epic:  Pertinent findings:  Social History   Tobacco Use   Smoking status: Every Day    Packs/day: 0.50    Years: 43.00    Total pack years: 21.50    Types: Cigarettes   Smokeless tobacco: Never  Vaping Use   Vaping Use: Never used  Substance Use Topics   Alcohol use: No    Alcohol/week: 0.0 standard drinks of alcohol   Drug use: No   Social History   Social History Narrative   Patient lives at home with her husband Jori Moll).   Disabled.   Right handed   Education 12 th grade   Caffeine- One cup of coffee daily.    OBJCTIVE -PE, EKG, labs   Wt Readings from Last 3 Encounters:  06/18/22 117 lb (53.1 kg)  01/01/22 117 lb (53.1 kg)  07/10/21 119 lb 6.4 oz (54.2 kg)    Physical Exam: BP 92/70 (BP Location: Left Arm, Patient Position: Sitting, Cuff Size: Normal)   Pulse 73   Ht 5\' 4"  (1.626 m)   Wt 117 lb (53.1 kg)   BMI 20.08 kg/m  Physical Exam   Adult ECG Report  Rate: *** ;  Rhythm: {rhythm:17366};   Narrative Interpretation: ***  Recent Labs:  Last Lipids 05/2021 - A1c 7.1  Lab Results  Component Value Date   CHOL 164 05/18/2012   HDL 50.70 05/18/2012   LDLCALC 99 05/18/2012   TRIG 74.0 05/18/2012   CHOLHDL 3 05/18/2012   Lab Results  Component Value Date   CREATININE 0.79 06/13/2020   BUN 14 06/13/2020   NA  130 (L) 06/13/2020   K 3.7 06/13/2020   CL 99 06/13/2020   CO2 18 (L) 06/13/2020      Latest Ref Rng & Units 06/13/2020    2:56 PM 04/06/2019    2:00 PM 06/09/2013    4:45 AM  CBC  WBC 4.0 - 10.5 K/uL 5.1  10.7  7.3   Hemoglobin 12.0 - 15.0 g/dL 12.7  15.6  13.6   Hematocrit 36.0 - 46.0 % 37.2  44.3  39.9   Platelets 150 - 400 K/uL 203  262  222     Lab Results  Component Value Date   HGBA1C 7.5 (H) 05/18/2012   Lab Results  Component Value Date   TSH 0.504 06/08/2013    ================================================== I spent a total of ***minutes with the patient spent in direct patient consultation.  Additional time spent with chart review  / charting (studies, outside notes, etc): *** min Total Time: *** min  Current medicines are reviewed at length with the patient today.  (+/- concerns) ***  Notice: This dictation was prepared with Dragon dictation along with smart phrase technology. Any transcriptional errors that result from this process are unintentional and may not be corrected upon review.  Studies Ordered:   No orders of the defined types were placed in this encounter.  No orders of the defined types were placed in this encounter.   Patient Instructions / Medication Changes & Studies & Tests Ordered   There are no Patient Instructions on file for this visit.     Leonie Man, MD, MS Glenetta Hew, M.D., M.S. Interventional Cardiologist  [place]  Pager # [phone] Phone # [phone] [address]. [place], [place] [address]   Thank you for choosing [facility]!!

## 2022-06-19 ENCOUNTER — Encounter: Payer: Self-pay | Admitting: Cardiology

## 2022-06-19 DIAGNOSIS — R42 Dizziness and giddiness: Secondary | ICD-10-CM | POA: Insufficient documentation

## 2022-06-19 NOTE — Assessment & Plan Note (Signed)
If anything, her blood pressure is now low.  She had been on beta-blocker plus lisinopril at a decent dose, but now not tolerating even 50 mg twice daily of lisinopril.  We will simply discontinue one of the doses and continue the nighttime dose.  Not on a standing dose of diuretic, but she is on [place].  Does not seem to be dehydrated, but read to the importance of needing to remain adequately hydrated.

## 2022-06-19 NOTE — Assessment & Plan Note (Addendum)
Avoid dehydration = stressed importance of hydrating (esp while on Farxiga).  Reduce ACE-I doe to 15 mg once daily - (1/2 of 30 mg tab) -- may even need to reduce further to 10 mg.

## 2022-06-19 NOTE — Assessment & Plan Note (Signed)
Tolerating low-dose rosuvastatin, but due for labs to be rechecked. She is on metformin and glipizide along with [place].  Labs are due to be checked by PCP soon.  Would recommend FLP, CMP and A1c at a minimum.

## 2022-06-19 NOTE — Assessment & Plan Note (Signed)
Should be due for follow-up stress test, trying to avoid it for now, but I think by this time next year we should check a Myoview since she is pretty sedentary.  This can be ordered at her 89-month visit for 54-month follow-up.

## 2022-06-19 NOTE — Assessment & Plan Note (Signed)
Left carotid subclavian bypass back in the time of her CABG.  This led to them using a free LIMA as opposed to pedicled LIMA.  Carotid and PV Dopplers ordered and followed by vascular surgery

## 2022-06-19 NOTE — Assessment & Plan Note (Signed)
We talked about smoking cessation for a while.  She has tried several attempts at cessation as has failed a time.  I talked briefly about various options, she is not interested in Chantix, but will try to cut down gradually.  Just not interested in smoking cessation at this point.

## 2022-06-19 NOTE — Assessment & Plan Note (Signed)
Basic left main Disease along with moderate RCA disease-four-vessel CABG. Nonischemic Myoview in April 2018.  Has not really had any anginal symptoms since.  Her angina at the time was diffuse pain across the back worse with exertion.  She remains relatively asymptomatic but is very sedentary.  Overdue for stress test, but is leery of doing a stress test now.  Plan: I admitted him she is on aspirin => she would be an excellent candidate for combination aspirin and low-dose (PV dose) Xarelto 5 mg twice daily Continue current dose of statin but need to recheck lipids. Reduce lisinopril to 15 mg once daily as opposed to twice daily. No longer on beta-blocker/metoprolol because of bradycardia and hypotension.  To avoid worsening dizziness will hold off on any further beta-blockade.  Plan will be for her to follow-up in 6 months with APP in the year with me.  Goal would be to have a Myoview stress test ordered prior to the annual visit with me.  This can be ordered at her 17-month follow-up. Would also consider making the addition of PV dose Xarelto

## 2022-10-19 ENCOUNTER — Other Ambulatory Visit: Payer: Self-pay | Admitting: *Deleted

## 2022-10-19 DIAGNOSIS — M79604 Pain in right leg: Secondary | ICD-10-CM

## 2022-10-19 DIAGNOSIS — I6523 Occlusion and stenosis of bilateral carotid arteries: Secondary | ICD-10-CM

## 2022-10-27 ENCOUNTER — Ambulatory Visit (HOSPITAL_COMMUNITY)
Admission: RE | Admit: 2022-10-27 | Discharge: 2022-10-27 | Disposition: A | Discharge status: Home / Self Care | Payer: Medicare Other | Source: Ambulatory Visit | Attending: Vascular Surgery | Admitting: Vascular Surgery

## 2022-10-27 ENCOUNTER — Ambulatory Visit: Payer: Medicare Other | Admitting: Physician Assistant

## 2022-10-27 ENCOUNTER — Ambulatory Visit (INDEPENDENT_AMBULATORY_CARE_PROVIDER_SITE_OTHER)
Admission: RE | Admit: 2022-10-27 | Discharge: 2022-10-27 | Disposition: A | Discharge status: Home / Self Care | Payer: Medicare Other | Source: Ambulatory Visit | Attending: Vascular Surgery | Admitting: Vascular Surgery

## 2022-10-27 VITALS — BP 128/78 | HR 80 | Temp 97.8°F | Wt 113.0 lb

## 2022-10-27 DIAGNOSIS — I739 Peripheral vascular disease, unspecified: Secondary | ICD-10-CM

## 2022-10-27 DIAGNOSIS — M79605 Pain in left leg: Secondary | ICD-10-CM | POA: Diagnosis present

## 2022-10-27 DIAGNOSIS — I6523 Occlusion and stenosis of bilateral carotid arteries: Secondary | ICD-10-CM

## 2022-10-27 DIAGNOSIS — M79604 Pain in right leg: Secondary | ICD-10-CM | POA: Insufficient documentation

## 2022-10-27 LAB — VAS US ABI WITH/WO TBI
Left ABI: 1.15
Right ABI: 0.91

## 2022-10-27 NOTE — Progress Notes (Signed)
History of Present Illness:  Patient is a 67 y.o. year old female who presents for evaluation of carotid stenosis and .  She has a history of left carotid subclavian bypass in 2003 by Dr. Hart Rochester for left UE claudication symptoms.  The patient denies symptoms of TIA, amaurosis, or stroke.  No left UE weakness or loss of sensation.     She has been asymptomatic since her surgery.  She also has a history of  aortobifemoral bypass grafting in 2002 by Dr. Hart Rochester.  She denise rest pain, claudication or non healing wounds. She is independent with her ADL's.  She is independent and very involved with her health care.    The pt is on a statin for cholesterol management.    The pt is on an aspirin.    Other AC:  none The pt is on ACEI, BB for hypertension.  The pt does have diabetes. Tobacco hx:  current  Past Medical History:  Diagnosis Date   Bronchitis 07/2012   became pneumonia   Carotid bruit    Coronary artery disease involving left main coronary artery 04/2001   CATH: dLM 50-60%-extending into 50% ostial LCx and 80-90% ostial LAD.  Mid LAD 50-60% and mid RCA 50%.--Referred for CABG x4 (free LIMA-LAD with SVG button, pedicle RIMA-RCA, SVG-D1, SVG-OM1) Dr. 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 Hart Rochester.   Ao-BiFem wioth L Renal A bypass.   CAROTID-SUBCLAVIAN BYPASS GRAFT Left 08/2001   Dr. Hart Rochester -> Total occlusion of proximal subclavian up to 1 cm  proximal to vertebral artery; subclavian artery transected and endarterectomy performed prior to anastomosis onto the common carotid artery, proximal subclavian stump oversewn   CATARACT EXTRACTION Right 09/2000   CORONARY ARTERY BYPASS GRAFT  05/03/2001   Dr. Cornelius Moras; x 4.  (free LIMA-LAD with SVG button, pedicle RIMA-RCA, SVG-D1, SVG-OM1)   LEFT HEART CATH AND CORONARY ANGIOGRAPHY  04/28/2001   Dr. Michele Mcalpine Nahser: Class IV angina (across the back) => Abn Cardiolite: dLM 50-60% => into 50% ostial LCx & ostial-prox LAD 80-90%. Mid LAD 50-60%.  Moderate sized D1, OM 1 & OM 2. RCA proximal 50% with there is damping of catheter. Referred for CABG after proximal LAD lesion.   NM MYOVIEW LTD  10/20/2016   EF 65-70%.  Normal wall motion.  No ischemia or infarction.  LOW RISK   PERIPHERAL VASCULAR CATHETERIZATION  01/2001   Left clavian CTO, 80% left renal artery stenosis and CTO of left CFA with 2-vessel runoff on left side.; R Renal A - FMD w/o stenosis.   TRANSTHORACIC ECHOCARDIOGRAM  06/2013   EF 60-65%. No R WMA. GRII DD. Normal valves     Social History Social History   Tobacco Use   Smoking status: Every Day    Packs/day: 0.50    Years: 43.00  Additional pack years: 0.00    Total pack years: 21.50    Types: Cigarettes   Smokeless tobacco: Never  Vaping Use   Vaping Use: Never used  Substance Use Topics   Alcohol use: No    Alcohol/week: 0.0 standard drinks of alcohol   Drug use: No    Family History Family History  Problem Relation Age of Onset   Cancer Mother        Uterain   Cancer Father        Prostate   Heart attack Paternal Grandfather    Heart attack Brother 41       Cardiac Infraction   Breast cancer Neg Hx     Allergies  Allergies  Allergen Reactions   Erythromycin Nausea And Vomiting and Swelling    Throw up   Zantac [Ranitidine Hcl] Hives, Itching and Swelling    Facial swelling   Adhesive [Tape] Rash    pls try paper tape   Lipitor [Atorvastatin  Calcium] Other (See Comments)    Muscle cramps in legs     Current Outpatient Medications  Medication Sig Dispense Refill   aspirin 81 MG EC tablet Take 81 mg by mouth daily after supper.     dapagliflozin propanediol (FARXIGA) 10 MG TABS tablet Take 10 mg by mouth daily. Samples     glipiZIDE (GLUCOTROL XL) 10 MG 24 hr tablet Take 20 mg by mouth 2 (two) times daily.     lisinopril (ZESTRIL) 30 MG tablet Take 0.5 tablets (15 mg total) by mouth daily. 45 tablet 3   metFORMIN (GLUCOPHAGE) 1000 MG tablet Take 1,000 mg by mouth 2 (two) times daily with a meal.     PARoxetine (PAXIL) 10 MG tablet Take 10 mg by mouth daily before breakfast.      rosuvastatin (CRESTOR) 10 MG tablet Take 10 mg by mouth at bedtime. Take 10 mg tablet by mouth daily     No current facility-administered medications for this visit.    ROS:   General:  No weight loss, Fever, chills  HEENT: No recent headaches, no nasal bleeding, no visual changes, no sore throat  Neurologic: No dizziness, blackouts, seizures. No recent symptoms of stroke or mini- stroke. No recent episodes of slurred speech, or temporary blindness.  Cardiac: No recent episodes of chest pain/pressure, no shortness of breath at rest.  No shortness of breath with exertion.  Denies history of atrial fibrillation or irregular heartbeat  Vascular: No history of rest pain in feet.  No history of claudication.  No history of non-healing ulcer, No history of DVT   Pulmonary: No home oxygen, no productive cough, no hemoptysis,  No asthma or wheezing  Musculoskeletal:  [x ] Arthritis, [ x] Low back pain,  [ ]  Joint pain  Hematologic:No history of hypercoagulable state.  No history of easy bleeding.  No history of anemia  Gastrointestinal: No hematochezia or melena,  No gastroesophageal reflux, no trouble swallowing  Urinary: [ ]  chronic Kidney disease, [ ]  on HD - [ ]  MWF or [ ]  TTHS, [ ]  Burning with urination, [ ]  Frequent urination, [ ]  Difficulty  urinating;   Skin: No rashes  Psychological: No history of anxiety,  No history of depression   Physical Examination  Vitals:   10/27/22 1015 10/27/22 1019  BP: 91/69 128/78  Pulse: 80   Temp: 97.8 F (36.6 C)   TempSrc: Temporal   SpO2: 97%   Weight: 113 lb (51.3 kg)  Body mass index is 19.4 kg/m.  General:  Alert and oriented, no acute distress HEENT: Normal Neck: No bruit or JVD Pulmonary: Clear to auscultation bilaterally Cardiac: Regular Rate and Rhythm without murmur Gastrointestinal: Soft, non-tender, non-distended, no mass, no scars Skin: No rash Extremity Pulses:  2+ radial,  femoral, dorsalis pedis,  pulses bilaterally Musculoskeletal: No deformity or edema  Neurologic: Upper and lower extremity motor 5/5 and symmetric  DATA:  Right Carotid Findings:  +----------+--------+--------+--------+-------------------------+--------+           PSV cm/sEDV cm/sStenosisPlaque Description       Comments  +----------+--------+--------+--------+-------------------------+--------+  CCA Prox  118     21              heterogenous                       +----------+--------+--------+--------+-------------------------+--------+  CCA Distal83      24              heterogenous                       +----------+--------+--------+--------+-------------------------+--------+  ICA Prox  274     83      60-79%  heterogenous and calcific          +----------+--------+--------+--------+-------------------------+--------+  ICA Mid   101     29                                                 +----------+--------+--------+--------+-------------------------+--------+  ICA Distal71      19                                                 +----------+--------+--------+--------+-------------------------+--------+  ECA      117                                                         +----------+--------+--------+--------+-------------------------+--------+   +----------+--------+-------+--------+-------------------+           PSV cm/sEDV cmsDescribeArm Pressure (mmHG)  +----------+--------+-------+--------+-------------------+  Subclavian104                   136                  +----------+--------+-------+--------+-------------------+   +---------+--------+---+--------+--+---------+  VertebralPSV cm/s111EDV cm/s18Antegrade  +---------+--------+---+--------+--+---------+      Left Carotid Findings:  +----------+--------+--------+--------+------------------+--------+           PSV cm/sEDV cm/sStenosisPlaque DescriptionComments  +----------+--------+--------+--------+------------------+--------+  CCA Prox  61      26              heterogenous                +----------+--------+--------+--------+------------------+--------+  CCA Distal61      27              heterogenous                +----------+--------+--------+--------+------------------+--------+  ICA Prox  52      29      1-39%  heterogenous                +----------+--------+--------+--------+------------------+--------+  ICA Mid   63      30                                          +----------+--------+--------+--------+------------------+--------+  ICA Distal67      32                                          +----------+--------+--------+--------+------------------+--------+  ECA      76      20                                          +----------+--------+--------+--------+------------------+--------+   +----------+--------+--------+--------+-------------------+           PSV cm/sEDV cm/sDescribeArm Pressure (mmHG)  +----------+--------+--------+--------+-------------------+  [id number]                     107                  +----------+--------+--------+--------+-------------------+    +---------+--------+--+--------+----------+  VertebralPSV cm/s70EDV cm/sRetrograde  +---------+--------+--+--------+----------+         Summary:  Right Carotid: Velocities in the right ICA are consistent with a 60-79%                 stenosis.   Left Carotid: Velocities in the left ICA are consistent with a 1-39%  stenosis.   Vertebrals: Right vertebral artery demonstrates antegrade flow. Left  vertebral             artery demonstrates retrograde flow.     ABI Findings:  +---------+------------------+-----+---------+--------+  Right   Rt Pressure (mmHg)IndexWaveform Comment   +---------+------------------+-----+---------+--------+  Brachial 136                                       +---------+------------------+-----+---------+--------+  PTA     104               0.76 triphasic          +---------+------------------+-----+---------+--------+  DP      124               0.91 triphasic          +---------+------------------+-----+---------+--------+  Great Toe80                0.59 Abnormal           +---------+------------------+-----+---------+--------+   +---------+------------------+-----+---------+-------+  Left    Lt Pressure (mmHg)IndexWaveform Comment  +---------+------------------+-----+---------+-------+  Brachial 107                                      +---------+------------------+-----+---------+-------+  PTA     146               1.07 triphasic         +---------+------------------+-----+---------+-------+  DP      156  1.15 triphasic         +---------+------------------+-----+---------+-------+  Great Toe111               0.82 Normal            +---------+------------------+-----+---------+-------+   +-------+-----------+-----------+------------+------------+  ABI/TBIToday's ABIToday's TBIPrevious ABIPrevious TBI   +-------+-----------+-----------+------------+------------+  Right 0.91       0.59       1.14        0.68          +-------+-----------+-----------+------------+------------+  Left  1.15       0.82       1.12        0.75          +-------+-----------+-----------+------------+------------+       Right ABIs appear decreased compared to prior study on 07/11/20. Left ABIs  appear essentially unchanged compared to prior study on 07/11/20.    Summary:  Right: Resting right ankle-brachial index indicates mild right lower  extremity arterial disease. The right toe-brachial index is abnormal.   Left: Resting left ankle-brachial index is within normal range. The left  toe-brachial index is normal.    ASSESSMENT:  Patient is a 67 y.o. year old female who presents for evaluation of carotid stenosis and .  She has a history of left carotid subclavian bypass in 2003 by Dr. Hart Rochester for left UE claudication symptoms.  The patient denies symptoms of TIA, amaurosis, or stroke.  No left UE weakness or loss of sensation.    Her carotid duplex shows Right Carotid: Velocities in the right ICA are consistent with a 60-79%  stenosis and Left Carotid: Velocities in the left ICA are consistent with a 1-39%  stenosis.   She has palpable radial pulse equal B UE.  She denies numbness, loss of motor or loss of sensation in the left UE.   The ABI's are unchanged and stable.  She maintains palpable pedal pulses.                     PLAN:She will f/u for carotid duplex in 6 months, and ABI's will be repeated in 1 year.   discussed s/s of stroke/TIA and if pt develops any sx, they know to call 911 or go to the emergency room   Mosetta Pigeon PA-C [facility]: [phone]  MD in clinic [place]

## 2022-11-03 ENCOUNTER — Other Ambulatory Visit: Payer: Self-pay

## 2022-11-03 DIAGNOSIS — I6523 Occlusion and stenosis of bilateral carotid arteries: Secondary | ICD-10-CM

## 2022-12-25 LAB — LAB REPORT - SCANNED
Albumin, Urine POC: 70.3
Creatinine, POC: 10.8 mg/dL
Microalb Creat Ratio: 651

## 2023-02-02 NOTE — Progress Notes (Unsigned)
Cardiology Office Note:  .   Date:  02/09/2023  ID:  Danielle Farrell, DOB 1956/02/02, MRN [id number] PCP: Kaleen Mask, MD  [facility] Providers Cardiologist:  Bryan Lemma, MD  History of Present Illness: .   Danielle Farrell is a 67 y.o. female with a past medical history of CAD s/p CABG, PAD, HLD, type 2 DM, carotid artery disease s/p left carotid subclavian bypass, HTN, migraines. Patient is followed by Dr. Herbie Baltimore and presents today for a 6 month follow up appointment.   Per chart review, patient had a cardiac catheterization in 2002 that showed  multivessel disease, including 50-60% stenosis in the distal left main. Patient underwent CABGx4 with LIMA-LAD with SVG button, pedicle RIMA-RCA, SVG-D1, and SVG-OM1. Patient also has peripheral vascular disease and carotid artery disease that is followed by vascular surgery. Previously underwent left carotid subclavian bypass in 2003 and aortobifemoral bypass grafting in 2002. Nuclear stress test in 2018 showed no evidence of ischemia or infarction, EF 67% with normal wall motion abnormalities. Echocardiogram in 01/2022 showed EF 60-65%, no regional wall motion abnormalities, grade I DD, normal RV systolic function.   Patient was last seen by Dr Herbie Baltimore on 06/18/22. At that time, patient denied having anginal symptoms. However, she was very sedentary. Dr. Herbie Baltimore recommended repeating a stress test, but patient was hesitant. She remained on ASA, statin. She was off BB due to bradycardia and hypotension. Her lisinopril was decreased due to 15 mg daily due to dizziness.   Today, patient reports that she is doing well from a cardiac perspective. She does admit that she is not very active in her day to day life. She is able to collect sticks from her yard and do light housework, but is otherwise fairly inactive. She denies chest pain or shortness of breath. She denies syncope or near syncope. Sometimes gets dizzy if she stands up to quickly. Denies  ankle edema, orthopnea. No cough. Continues to smoke about 1/2 pack of cigarettes daily. Is followed by vascular surgery for carotid artery disease and PAD. She sees her PCP every 3 months or so and has an appointment with nephrology tomorrow to evaluate protein in her urine   ROS: Denies chest pain, shortness of breath, syncope, near syncope, cough, palpitations, ankle edema. Sometimes gets dizzy when standing.   Studies Reviewed: Marland Kitchen   EKG Interpretation Date/Time:  Wednesday February 09 2023 09:06:01 EDT Ventricular Rate:  75 PR Interval:  142 QRS Duration:  76 QT Interval:  370 QTC Calculation: 413 R Axis:   48  Text Interpretation: Normal sinus rhythm Possible Left atrial enlargement Nonspecific T wave abnormality When compared with ECG of 15-Aug-2001 16:43, No significant change was found Confirmed by Robet Leu [phone]) on 02/09/2023 9:16:04 AM    Cardiac Studies & Procedures     STRESS TESTS  MYOCARDIAL PERFUSION IMAGING 10/27/2016  Narrative  The left ventricular ejection fraction is hyperdynamic (>65%).  Nuclear stress EF: 67%.  There was no ST segment deviation noted during stress.  1. EF 67% with normal wall motion. 2. No evidence for ischemia or infarction.   ECHOCARDIOGRAM  ECHOCARDIOGRAM COMPLETE 01/29/2022  Narrative ECHOCARDIOGRAM REPORT    Patient Name:   Danielle Farrell   Date of Exam: 01/29/2022 Medical Rec #:  [id number]     Height:       64.0 in Accession #:    [id number]    Weight:       117.0 lb Date of Birth:  September 25, 1955  BSA:          1.558 m Patient Age:    65 years      BP:           100/70 mmHg Patient Gender: F             HR:           67 bpm. Exam Location:  [address]  Procedure: 2D Echo, Cardiac Doppler, Color Doppler, 3D Echo and Strain Analysis  Indications:    Aortic Valve Disorder I35.9  History:        Patient has prior history of Echocardiogram examinations, most recent 06/16/2013. CAD, Prior CABG; Risk  Factors:Hypertension, Diabetes and Dyslipidemia.  Sonographer:    Thurman Coyer RDCS Referring Phys: [id number] Bettey Mare [place]ENCE  IMPRESSIONS   1. Left ventricular ejection fraction, by estimation, is 60 to 65%. Left ventricular ejection fraction by 3D volume is 62 %. The left ventricle has normal function. The left ventricle has no regional wall motion abnormalities. There is mild left ventricular hypertrophy. Left ventricular diastolic parameters are consistent with Grade I diastolic dysfunction (impaired relaxation). The average left ventricular global longitudinal strain is -20.8 %. The global longitudinal strain is normal. 2. Right ventricular systolic function is normal. The right ventricular size is normal. There is normal pulmonary artery systolic pressure. The estimated right ventricular systolic pressure is 27.4 mmHg. 3. The mitral valve is normal in structure. Trivial mitral valve regurgitation. No evidence of mitral stenosis. 4. The aortic valve is tricuspid. There is mild calcification of the aortic valve. Aortic valve regurgitation is not visualized. No aortic stenosis is present. 5. The inferior vena cava is normal in size with greater than 50% respiratory variability, suggesting right atrial pressure of 3 mmHg.  FINDINGS Left Ventricle: Left ventricular ejection fraction, by estimation, is 60 to 65%. Left ventricular ejection fraction by 3D volume is 62 %. The left ventricle has normal function. The left ventricle has no regional wall motion abnormalities. The average left ventricular global longitudinal strain is -20.8 %. The global longitudinal strain is normal. The global longitudinal strain is normal despite suboptimal segment tracking. The left ventricular internal cavity size was normal in size. There is mild left ventricular hypertrophy. Left ventricular diastolic parameters are consistent with Grade I diastolic dysfunction (impaired relaxation).  Right Ventricle: The  right ventricular size is normal. No increase in right ventricular wall thickness. Right ventricular systolic function is normal. There is normal pulmonary artery systolic pressure. The tricuspid regurgitant velocity is 2.47 m/s, and with an assumed right atrial pressure of 3 mmHg, the estimated right ventricular systolic pressure is 27.4 mmHg.  Left Atrium: Left atrial size was normal in size.  Right Atrium: Right atrial size was normal in size.  Pericardium: Trivial pericardial effusion is present.  Mitral Valve: The mitral valve is normal in structure. Mild mitral annular calcification. Trivial mitral valve regurgitation. No evidence of mitral valve stenosis.  Tricuspid Valve: The tricuspid valve is normal in structure. Tricuspid valve regurgitation is mild . No evidence of tricuspid stenosis.  Aortic Valve: The aortic valve is tricuspid. There is mild calcification of the aortic valve. Aortic valve regurgitation is not visualized. No aortic stenosis is present.  Pulmonic Valve: The pulmonic valve was normal in structure. Pulmonic valve regurgitation is trivial. No evidence of pulmonic stenosis.  Aorta: The aortic root is normal in size and structure.  Venous: The inferior vena cava is normal in size with greater than 50% respiratory variability, suggesting right  atrial pressure of 3 mmHg.  IAS/Shunts: No atrial level shunt detected by color flow Doppler.   LEFT VENTRICLE PLAX 2D LVIDd:         4.10 cm         Diastology LVIDs:         2.60 cm         LV e' medial:    5.52 cm/s LV PW:         1.00 cm         LV E/e' medial:  13.7 LV IVS:        1.20 cm         LV e' lateral:   7.18 cm/s LVOT diam:     2.10 cm         LV E/e' lateral: 10.6 LV SV:         69 LV SV Index:   44              2D LVOT Area:     3.46 cm        Longitudinal Strain 2D Strain GLS  -20.8 % Avg:  3D Volume EF LV 3D EF:    Left ventricul ar ejection fraction by 3D volume is 62 %.  3D Volume  EF: 3D EF:        62 %  RIGHT VENTRICLE RV Basal diam:  3.00 cm RV S prime:     7.97 cm/s TAPSE (M-mode): 1.1 cm  LEFT ATRIUM             Index        RIGHT ATRIUM           Index LA diam:        3.20 cm 2.05 cm/m   RA Area:     11.10 cm LA Vol (A2C):   38.0 ml 24.39 ml/m  RA Volume:   22.90 ml  14.70 ml/m LA Vol (A4C):   24.2 ml 15.54 ml/m LA Biplane Vol: 30.9 ml 19.84 ml/m AORTIC VALVE LVOT Vmax:   108.00 cm/s LVOT Vmean:  63.400 cm/s LVOT VTI:    0.199 m  AORTA Ao Root diam: 2.90 cm Ao Asc diam:  3.30 cm  MITRAL VALVE                TRICUSPID VALVE MV Area (PHT): 3.34 cm     TR Peak grad:   24.4 mmHg MV Decel Time: 227 msec     TR Vmax:        247.00 cm/s MV E velocity: 75.80 cm/s MV A velocity: 102.00 cm/s  SHUNTS MV E/A ratio:  0.74         Systemic VTI:  0.20 m Systemic Diam: 2.10 cm  Weston Brass MD Electronically signed by Weston Brass MD Signature Date/Time: 01/29/2022/8:46:46 PM    Final              Risk Assessment/Calculations:             Physical Exam:   VS:  BP 124/72   Pulse 75   Ht 5\' 4"  (1.626 m)   Wt 114 lb (51.7 kg)   BMI 19.57 kg/m    Wt Readings from Last 3 Encounters:  02/09/23 114 lb (51.7 kg)  10/27/22 113 lb (51.3 kg)  06/18/22 117 lb (53.1 kg)    GEN: Thin, elderly female. Sitting comfortably on the side of the bed in no acute distress  NECK: No JVD; Right carotid bruit  present.  CARDIAC: RRR, no murmurs, rubs, gallops. Radial pulses 2+ bilaterally  RESPIRATORY:  Crackles in bilateral lung bases, otherwise clear to auscultation. Normal work of breathing on room air  ABDOMEN: Soft, non-tender, non-distended EXTREMITIES:  No edema in BLE; No deformity   ASSESSMENT AND PLAN: .    CAD s/p CABG - Previously had CABGx4 in 2002 with LIMA-LAD with SVG button, pedicle RIMA-RCA, SVG-D1, and SVG-OM1 - Patient denies angina, but she is not very active in her day to day live. Dr. Herbie Baltimore recommended stress test in 06/2022,  but patient was hesitant at that time  - Recommended nuclear stress test- patient reports that she is unable to walk on a treadmill. Discussed use of Lexiscan, and patient is in agreement. Ordered stress test   - EKG nonischemic today  - Continue ASA - Continue crestor 10 mg daily - Patient was previously on BB, but it was discontinued due to bradycardia and hypotension  - Counseled patient on tobacco cessation   HLD  - Patient reports that her cholesterol is checked regularly by her PCP. Requested that labs be faxed to our office  - Continue Crestor 10 mg daily   HTN  - Currently on lisinopril 15 mg daily - BP well controlled - Labs followed by PCP- requested they be faxed to our office   Tobacco Use  - Patient reports that she smokes about 1/2 pack of cigarettes daily  - Counseled patient on tobacco cessation   Dizziness - Patient reports that sometimes when she stands up too quickly, she becomes dizzy. Denies syncope, near syncope. Symptoms are controlled if she stands slowly. Did not notice any improvement in symptoms since her lisinopril dose was decreased  - Discussed importance of maintaining adequate hydration, especially on farxiga, and eating regularly throughout the day. Encouraged patient to make position changes slowly to prevent dizziness. She denies dizziness that is not associated with positional changes   Carotid Artery Disease  Peripheral Artery Disease  - Previously underwent left carotid subclavian bypass in 2003 and aortobifemoral bypass grafting in 2002  - Followed by vascular surgery. Patient was recently seen in 10/2022 and is scheduled for carotid duplex studies in 6 months and ABIs in 1 year  - On ASA, statin therapy   Informed Consent   Shared Decision Making/Informed Consent The risks [chest pain, shortness of breath, cardiac arrhythmias, dizziness, blood pressure fluctuations, myocardial infarction, stroke/transient ischemic attack, nausea, vomiting,  allergic reaction, radiation exposure, metallic taste sensation and life-threatening complications (estimated to be 1 in 10,000)], benefits (risk stratification, diagnosing coronary artery disease, treatment guidance) and alternatives of a nuclear stress test were discussed in detail with Ms. Begay and she agrees to proceed.     Dispo: 6 months with Dr. Herbie Baltimore   Signed, Jonita Albee, PA-C

## 2023-02-09 ENCOUNTER — Encounter: Payer: Self-pay | Admitting: Cardiology

## 2023-02-09 ENCOUNTER — Ambulatory Visit: Payer: Medicare Other | Attending: Cardiology | Admitting: Cardiology

## 2023-02-09 VITALS — BP 124/72 | HR 75 | Ht 64.0 in | Wt 114.0 lb

## 2023-02-09 DIAGNOSIS — E1169 Type 2 diabetes mellitus with other specified complication: Secondary | ICD-10-CM | POA: Diagnosis not present

## 2023-02-09 DIAGNOSIS — I10 Essential (primary) hypertension: Secondary | ICD-10-CM

## 2023-02-09 DIAGNOSIS — Z720 Tobacco use: Secondary | ICD-10-CM

## 2023-02-09 DIAGNOSIS — Z7984 Long term (current) use of oral hypoglycemic drugs: Secondary | ICD-10-CM

## 2023-02-09 DIAGNOSIS — R42 Dizziness and giddiness: Secondary | ICD-10-CM

## 2023-02-09 DIAGNOSIS — I6523 Occlusion and stenosis of bilateral carotid arteries: Secondary | ICD-10-CM

## 2023-02-09 DIAGNOSIS — I2510 Atherosclerotic heart disease of native coronary artery without angina pectoris: Secondary | ICD-10-CM | POA: Diagnosis not present

## 2023-02-09 DIAGNOSIS — I739 Peripheral vascular disease, unspecified: Secondary | ICD-10-CM

## 2023-02-09 DIAGNOSIS — E785 Hyperlipidemia, unspecified: Secondary | ICD-10-CM

## 2023-02-09 NOTE — Patient Instructions (Addendum)
Medication Instructions:  Your physician recommends that you continue on your current medications as directed. Please refer to the Current Medication list given to you today.  *If you need a refill on your cardiac medications before your next appointment, please call your pharmacy*   Lab Work: None   Testing/Procedures: Your physician has requested that you have a lexiscan myoview. For further information please visit [email or web address] Please follow instruction sheet, as given.   The test will take approximately 3 to 4 hours to complete; you may bring reading material.  If someone comes with you to your appointment, they will need to remain in the main lobby due to limited space in the testing area. **If you are pregnant or breastfeeding, please notify the nuclear lab prior to your appointment**  How to prepare for your Myocardial Perfusion Test: Do not eat or drink 3 hours prior to your test, except you may have water. Do not consume products containing caffeine (regular or decaffeinated) 12 hours prior to your test. (ex: coffee, chocolate, sodas, tea). Do bring a list of your current medications with you.  If not listed below, you may take your medications as normal. Do wear comfortable clothes (no dresses or overalls) and walking shoes, tennis shoes preferred (No heels or open toe shoes are allowed). Do NOT wear cologne, perfume, aftershave, or lotions (deodorant is allowed). If these instructions are not followed, your test will have to be rescheduled.     Follow-Up: At [facility], you and your health needs are our priority.  As part of our continuing mission to provide you with exceptional heart care, we have created designated Provider Care Teams.  These Care Teams include your primary Cardiologist (physician) and Advanced Practice Providers (APPs -  Physician Assistants and Nurse Practitioners) who all work together to provide you with the care you need, when you need  it.   Your next appointment:   6 month(s)  Provider:   Bryan Lemma, MD     Other Instructions Please have your primary care send over lab results.

## 2023-02-18 ENCOUNTER — Encounter (HOSPITAL_COMMUNITY): Payer: Medicare Other

## 2023-03-22 ENCOUNTER — Encounter (HOSPITAL_COMMUNITY): Payer: Self-pay

## 2023-03-25 ENCOUNTER — Ambulatory Visit (HOSPITAL_COMMUNITY): Payer: Medicare Other

## 2023-04-14 ENCOUNTER — Other Ambulatory Visit: Payer: Self-pay | Admitting: Nephrology

## 2023-04-14 DIAGNOSIS — R8290 Unspecified abnormal findings in urine: Secondary | ICD-10-CM

## 2023-04-14 DIAGNOSIS — E1122 Type 2 diabetes mellitus with diabetic chronic kidney disease: Secondary | ICD-10-CM

## 2023-04-26 ENCOUNTER — Ambulatory Visit
Admission: RE | Admit: 2023-04-26 | Discharge: 2023-04-26 | Disposition: A | Discharge status: Home / Self Care | Payer: Medicare Other | Source: Ambulatory Visit | Attending: Nephrology | Admitting: Nephrology

## 2023-04-26 DIAGNOSIS — Z9889 Other specified postprocedural states: Secondary | ICD-10-CM | POA: Insufficient documentation

## 2023-04-26 DIAGNOSIS — E1122 Type 2 diabetes mellitus with diabetic chronic kidney disease: Secondary | ICD-10-CM | POA: Insufficient documentation

## 2023-04-26 DIAGNOSIS — R8290 Unspecified abnormal findings in urine: Secondary | ICD-10-CM | POA: Diagnosis present

## 2023-04-26 DIAGNOSIS — E785 Hyperlipidemia, unspecified: Secondary | ICD-10-CM | POA: Diagnosis not present

## 2023-04-26 DIAGNOSIS — K760 Fatty (change of) liver, not elsewhere classified: Secondary | ICD-10-CM | POA: Diagnosis not present

## 2023-04-26 DIAGNOSIS — I129 Hypertensive chronic kidney disease with stage 1 through stage 4 chronic kidney disease, or unspecified chronic kidney disease: Secondary | ICD-10-CM | POA: Diagnosis not present

## 2023-04-26 DIAGNOSIS — N281 Cyst of kidney, acquired: Secondary | ICD-10-CM | POA: Diagnosis not present

## 2023-04-26 DIAGNOSIS — R809 Proteinuria, unspecified: Secondary | ICD-10-CM | POA: Insufficient documentation

## 2023-04-26 DIAGNOSIS — N189 Chronic kidney disease, unspecified: Secondary | ICD-10-CM | POA: Insufficient documentation

## 2023-05-04 ENCOUNTER — Ambulatory Visit (HOSPITAL_COMMUNITY)
Admission: RE | Admit: 2023-05-04 | Discharge: 2023-05-04 | Disposition: A | Discharge status: Home / Self Care | Payer: Medicare Other | Source: Ambulatory Visit | Attending: Vascular Surgery | Admitting: Vascular Surgery

## 2023-05-04 ENCOUNTER — Ambulatory Visit: Payer: Medicare Other | Admitting: Physician Assistant

## 2023-05-04 VITALS — BP 96/69 | HR 76 | Temp 98.2°F | Resp 18 | Ht 64.0 in | Wt 110.5 lb

## 2023-05-04 DIAGNOSIS — I6523 Occlusion and stenosis of bilateral carotid arteries: Secondary | ICD-10-CM | POA: Diagnosis not present

## 2023-05-04 DIAGNOSIS — I739 Peripheral vascular disease, unspecified: Secondary | ICD-10-CM

## 2023-05-04 NOTE — Progress Notes (Signed)
History of Present Illness:  Patient is a 67 y.o. year old female who presents for evaluation of carotid stenosis.   She has a history of left carotid subclavian bypass in 2003 by Dr. Hart Rochester for left UE claudication symptoms.  The patient denies symptoms of TIA, amaurosis, or stroke.  No left UE weakness or loss of sensation.  No change from 6 months ago.    She also has a history of aortobifemoral bypass grafting in 2002 by Dr. Hart Rochester. She denise rest pain, claudication or non healing wounds. She has now new issues other than her husband just retired and doesn't know what to do with himself and is very bubbly.    The pt is on a statin for cholesterol management.    The pt is on an aspirin.    Other AC:  none The pt is on ACEI, BB for hypertension.  The pt does have diabetes. Tobacco hx:  current  Past Medical History:  Diagnosis Date   Bronchitis 07/2012   became pneumonia   Carotid bruit    Coronary artery disease involving left main coronary artery 04/2001   CATH: dLM 50-60%-extending into 50% ostial LCx and 80-90% ostial LAD.  Mid LAD 50-60% and mid RCA 50%.--Referred for CABG x4 (free LIMA-LAD with SVG button, pedicle RIMA-RCA, SVG-D1, SVG-OM1) Dr. 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 Hart Rochester.   Ao-BiFem wioth L Renal A bypass.   CAROTID-SUBCLAVIAN BYPASS GRAFT Left 08/2001   Dr. Hart Rochester -> Total occlusion of proximal subclavian up  to 1 cm proximal to vertebral artery; subclavian artery transected and endarterectomy performed prior to anastomosis onto the common carotid artery, proximal subclavian stump oversewn   CATARACT EXTRACTION Right 09/2000   CORONARY ARTERY BYPASS GRAFT  05/03/2001   Dr. Cornelius Moras; x 4.  (free LIMA-LAD with SVG button, pedicle RIMA-RCA, SVG-D1, SVG-OM1)   LEFT HEART CATH AND CORONARY ANGIOGRAPHY  04/28/2001   Dr. Michele Mcalpine Nahser: Class IV angina (across the back) => Abn Cardiolite: dLM 50-60% => into 50% ostial LCx & ostial-prox LAD 80-90%. Mid LAD 50-60%.  Moderate sized D1, OM 1 & OM 2. RCA proximal 50% with there is damping of catheter. Referred for CABG after proximal LAD lesion.   NM MYOVIEW LTD  10/20/2016   EF 65-70%.  Normal wall motion.  No ischemia or infarction.  LOW RISK   PERIPHERAL VASCULAR CATHETERIZATION  01/2001   Left clavian CTO, 80% left renal artery stenosis and CTO of left CFA with 2-vessel runoff on left side.; R Renal A - FMD w/o stenosis.   TRANSTHORACIC ECHOCARDIOGRAM  06/2013   EF 60-65%. No R WMA. GRII DD. Normal valves     Social History Social History   Tobacco Use   Smoking status: Every Day    Current packs/day: 0.50    Average  packs/day: 0.5 packs/day for 43.0 years (21.5 ttl pk-yrs)    Types: Cigarettes   Smokeless tobacco: Never  Vaping Use   Vaping status: Never Used  Substance Use Topics   Alcohol use: No    Alcohol/week: 0.0 standard drinks of alcohol   Drug use: No    Family History Family History  Problem Relation Age of Onset   Cancer Mother        Uterain   Cancer Father        Prostate   Heart attack Paternal Grandfather    Heart attack Brother 51       Cardiac Infraction   Breast cancer Neg Hx     Allergies  Allergies  Allergen Reactions   Erythromycin Nausea And Vomiting and Swelling    Throw up   Zantac [Ranitidine Hcl] Hives, Itching and Swelling    Facial swelling   Adhesive [Tape] Rash    pls try paper tape   Lipitor  [Atorvastatin Calcium] Other (See Comments)    Muscle cramps in legs     Current Outpatient Medications  Medication Sig Dispense Refill   aspirin 81 MG EC tablet Take 81 mg by mouth daily after supper.     dapagliflozin propanediol (FARXIGA) 10 MG TABS tablet Take 10 mg by mouth daily. Samples     glipiZIDE (GLUCOTROL XL) 10 MG 24 hr tablet Take 10 mg by mouth 2 (two) times daily.     lisinopril (ZESTRIL) 30 MG tablet Take 0.5 tablets (15 mg total) by mouth daily. 45 tablet 3   metFORMIN (GLUCOPHAGE) 1000 MG tablet Take 1,000 mg by mouth 2 (two) times daily with a meal.     PARoxetine (PAXIL) 10 MG tablet Take 10 mg by mouth daily before breakfast.      rosuvastatin (CRESTOR) 20 MG tablet Take 20 mg by mouth daily.     No current facility-administered medications for this visit.    ROS:   General:  No weight loss, Fever, chills  HEENT: No recent headaches, no nasal bleeding, no visual changes, no sore throat  Neurologic: No dizziness, blackouts, seizures. No recent symptoms of stroke or mini- stroke. No recent episodes of slurred speech, or temporary blindness.  Cardiac: No recent episodes of chest pain/pressure, no shortness of breath at rest.  No shortness of breath with exertion.  Denies history of atrial fibrillation or irregular heartbeat  Vascular: No history of rest pain in feet.  No history of claudication.  No history of non-healing ulcer, No history of DVT   Pulmonary: No home oxygen, no productive cough, no hemoptysis,  No asthma or wheezing  Musculoskeletal:  [ ]  Arthritis, [ ]  Low back pain,  [ ]  Joint pain  Hematologic:No history of hypercoagulable state.  No history of easy bleeding.  No history of anemia  Gastrointestinal: No hematochezia or melena,  No gastroesophageal reflux, no trouble swallowing  Urinary: [ ]  chronic Kidney disease, [ ]  on HD - [ ]  MWF or [ ]  TTHS, [ ]  Burning with urination, [ ]  Frequent urination, [ ]  Difficulty urinating;   Skin: No  rashes  Psychological: No history of anxiety,  No history of depression   Physical Examination  Vitals:   05/04/23 0852  BP: 96/69  Pulse: 76  Resp: 18  Temp: 98.2 F (36.8 C)  SpO2: 98%  Weight: 110 lb 8 oz (50.1 kg)  Height: 5\' 4"  (1.626 m)    Body mass index is 18.97 kg/m.  General:  Alert  and oriented, no acute distress HEENT: Normal Neck: No bruit or JVD Pulmonary: Clear to auscultation bilaterally Cardiac: Regular Rate and Rhythm without murmur Gastrointestinal: Soft, non-tender, non-distended, no mass, no scars Skin: No rash Extremity Pulses:   radial, femoral, dorsalis pedis,  pulses bilaterally Musculoskeletal: No deformity or edema  Neurologic: Upper and lower extremity motor 5/5 and symmetric  DATA:  Carotid duplex  Right Carotid: Velocities in the right ICA are consistent with a 60-79%                 stenosis. EDV 72 cm/s.   This shows a decrease in the from 6 months ago with EDS of 83 cm/s  Left Carotid: Velocities in the left ICA are consistent with a 1-39%  stenosis.   Vertebrals: Right vertebral artery demonstrates antegrade flow. Left  vertebral             artery demonstrates retrograde flow.   ASSESSMENT/PLAN:  Carotid stenosis asymptomatic on the right with stable stenosis < 80% History of left  carotid subclavian anastomosis by Dr. Hart Rochester in 2003 for left UE claudication symptoms.  Asymptomatic B LE s/p aortobifemoral bypass graft in 2002 by Dr. Hart Rochester.    She denies symptoms or stroke/TIA, no claudication, or rest pain.  Her duplex is with a 60-79% stenosis. EDV 72 cm/s.   This shows a decrease in the from 6 months ago with EDS of 83 cm/s stable.   She will f/u in 6 months for repeat carotid duplex and ABI's to follow her inflow s/p aortobifemoral bypass graft.  If she develops symptoms of stroke/TIA she will call 911.         Mosetta Pigeon PA-C [facility] Office: [phone]  MD in clinic  [place]

## 2023-05-20 ENCOUNTER — Other Ambulatory Visit: Payer: Self-pay

## 2023-05-20 DIAGNOSIS — I739 Peripheral vascular disease, unspecified: Secondary | ICD-10-CM

## 2023-05-20 DIAGNOSIS — I6523 Occlusion and stenosis of bilateral carotid arteries: Secondary | ICD-10-CM

## 2023-07-29 ENCOUNTER — Other Ambulatory Visit: Payer: Self-pay | Admitting: Cardiology

## 2023-09-23 ENCOUNTER — Other Ambulatory Visit: Payer: Self-pay | Admitting: Family Medicine

## 2023-09-23 DIAGNOSIS — Z0000 Encounter for general adult medical examination without abnormal findings: Secondary | ICD-10-CM

## 2023-09-23 DIAGNOSIS — F17200 Nicotine dependence, unspecified, uncomplicated: Secondary | ICD-10-CM

## 2023-09-23 DIAGNOSIS — Z1231 Encounter for screening mammogram for malignant neoplasm of breast: Secondary | ICD-10-CM

## 2023-09-29 ENCOUNTER — Ambulatory Visit
Admission: RE | Admit: 2023-09-29 | Discharge: 2023-09-29 | Disposition: A | Discharge status: Home / Self Care | Source: Ambulatory Visit | Attending: Family Medicine | Admitting: Family Medicine

## 2023-09-29 DIAGNOSIS — F17200 Nicotine dependence, unspecified, uncomplicated: Secondary | ICD-10-CM

## 2023-10-11 ENCOUNTER — Ambulatory Visit
Admission: RE | Admit: 2023-10-11 | Discharge: 2023-10-11 | Disposition: A | Discharge status: Home / Self Care | Source: Ambulatory Visit | Attending: Family Medicine | Admitting: Family Medicine

## 2023-10-11 DIAGNOSIS — Z1231 Encounter for screening mammogram for malignant neoplasm of breast: Secondary | ICD-10-CM

## 2023-10-14 ENCOUNTER — Other Ambulatory Visit: Payer: Self-pay | Admitting: Family Medicine

## 2023-10-14 DIAGNOSIS — R928 Other abnormal and inconclusive findings on diagnostic imaging of breast: Secondary | ICD-10-CM

## 2023-10-27 ENCOUNTER — Ambulatory Visit
Admission: RE | Admit: 2023-10-27 | Discharge: 2023-10-27 | Disposition: A | Discharge status: Home / Self Care | Source: Ambulatory Visit | Attending: Family Medicine | Admitting: Family Medicine

## 2023-10-27 DIAGNOSIS — R928 Other abnormal and inconclusive findings on diagnostic imaging of breast: Secondary | ICD-10-CM

## 2023-11-02 ENCOUNTER — Ambulatory Visit (HOSPITAL_COMMUNITY)
Admission: RE | Admit: 2023-11-02 | Discharge: 2023-11-02 | Disposition: A | Discharge status: Home / Self Care | Payer: Medicare Other | Source: Ambulatory Visit | Attending: Vascular Surgery | Admitting: Vascular Surgery

## 2023-11-02 ENCOUNTER — Encounter: Payer: Self-pay | Admitting: Physician Assistant

## 2023-11-02 ENCOUNTER — Ambulatory Visit (HOSPITAL_BASED_OUTPATIENT_CLINIC_OR_DEPARTMENT_OTHER)
Admission: RE | Admit: 2023-11-02 | Discharge: 2023-11-02 | Disposition: A | Discharge status: Home / Self Care | Payer: Medicare Other | Source: Ambulatory Visit | Attending: Vascular Surgery | Admitting: Vascular Surgery

## 2023-11-02 ENCOUNTER — Ambulatory Visit: Payer: Medicare Other | Attending: Vascular Surgery | Admitting: Physician Assistant

## 2023-11-02 VITALS — BP 146/70 | HR 53 | Temp 98.0°F | Wt 100.0 lb

## 2023-11-02 DIAGNOSIS — I6523 Occlusion and stenosis of bilateral carotid arteries: Secondary | ICD-10-CM | POA: Insufficient documentation

## 2023-11-02 DIAGNOSIS — I739 Peripheral vascular disease, unspecified: Secondary | ICD-10-CM

## 2023-11-02 LAB — VAS US ABI WITH/WO TBI
Left ABI: 1.04
Right ABI: 0.76

## 2023-11-02 NOTE — Progress Notes (Signed)
 Office Note     CC:  follow up Requesting Provider:  Candiss Chamorro, *  HPI: Danielle Farrell is a 68 y.o. (August 20, 1955) female who presents for routine follow up of carotid artery stenosis and PAD. She has remote history of left carotid subclavian bypass by Dr. Timm Foot in 2003. This was for LUE claudication symptoms. She has no history of TIA or stroke. Her LUE claudication has been resolved since her surgery. She has also previously undergone aortobifemoral bypass by Dr. Timm Foot in 2002.   Today she reports overall doing well. No complaints. She denies any visual changes, slurred speech, facial drooping, unilateral upper or lower extremity weakness or numbness. She denies any pain in her legs on ambulation or rest, no tissue loss. She denies any abdominal pain or back pain. She says she recently did a lot of cleaning outside on her porch so she is having a little hip pain today. She also has had some cramping in her legs recently but she attributes that to not drinking enough water. She has been drinking pickle juice which has helped. She is medically managed on Aspirin and statin. She explains that she has not been very good about keeping her blood sugars in check and has had some adjustments recently to her diabetes medication.   Past Medical History:  Diagnosis Date   Bronchitis 07/2012   became pneumonia   Carotid bruit    Coronary artery disease involving left main coronary artery 04/2001   CATH: dLM 50-60%-extending into 50% ostial LCx and 80-90% ostial LAD.  Mid LAD 50-60% and mid RCA 50%.--Referred for CABG x4 (free LIMA-LAD with SVG button, pedicle RIMA-RCA, SVG-D1, SVG-OM1) Dr. 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 Timm Foot.   Ao-BiFem wioth L Renal A bypass.   CAROTID-SUBCLAVIAN BYPASS GRAFT Left 08/2001   Dr. Timm Foot -> Total occlusion of proximal subclavian up to 1 cm proximal to vertebral artery; subclavian artery transected and endarterectomy performed prior to anastomosis onto the common carotid artery, proximal subclavian stump oversewn   CATARACT EXTRACTION Right 09/2000   CORONARY ARTERY BYPASS GRAFT  05/03/2001   Dr. Alva Jewels; x 4.  (free LIMA-LAD with SVG button, pedicle RIMA-RCA, SVG-D1, SVG-OM1)   LEFT HEART CATH AND CORONARY ANGIOGRAPHY  04/28/2001   Dr. Gwynn Lesches Nahser: Class IV angina (across the back) => Abn Cardiolite: dLM 50-60% => into 50% ostial LCx & ostial-prox LAD 80-90%. Mid LAD 50-60%.  Moderate sized D1, OM 1 & OM 2. RCA proximal 50% with there is damping of catheter. Referred for CABG after proximal LAD lesion.   NM MYOVIEW  LTD  10/20/2016   EF 65-70%.  Normal wall motion.  No ischemia or infarction.  LOW RISK   PERIPHERAL VASCULAR CATHETERIZATION  01/2001   Left clavian CTO, 80% left renal artery stenosis and CTO of left CFA with  2-vessel runoff on left side.; R Renal A - FMD w/o stenosis.   TRANSTHORACIC ECHOCARDIOGRAM  06/2013   EF 60-65%. No R WMA. GRII DD. Normal valves    Social History   Socioeconomic History   Marital status: Married    Spouse name: Currie Douse   Number of children: 0   Years of education: 12   Highest education level: Not on file  Occupational History    Employer: DISABLED    Comment: Disabled  Tobacco Use   Smoking status: Every Day    Current packs/day: 0.50    Average packs/day: 0.5 packs/day for 43.0 years (21.5 ttl pk-yrs)    Types: Cigarettes   Smokeless tobacco:  Never  Vaping Use   Vaping status: Never Used  Substance and Sexual Activity   Alcohol use: No    Alcohol/week: 0.0 standard drinks of alcohol   Drug use: No   Sexual activity: Not Currently  Other Topics Concern   Not on file  Social History Narrative   Patient lives at home with her husband Currie Douse).   Disabled.   Right handed   Education 12 th grade   Caffeine- One cup of coffee daily.      Not interested in smoking cessation.   Social Drivers of Corporate investment banker Strain: Not on file  Food Insecurity: Not on file  Transportation Needs: Not on file  Physical Activity: Not on file  Stress: Not on file  Social Connections: Not on file  Intimate Partner Violence: Not on file    Family History  Problem Relation Age of Onset   Cancer Mother        Danielle Farrell   Prostate cancer Father    Heart attack Paternal Grandfather    Heart attack Brother 15       Cardiac Infraction   Breast cancer Neg Hx     Current Outpatient Medications  Medication Sig Dispense Refill   aspirin 81 MG EC tablet Take 81 mg by mouth daily after supper.     JANUVIA 100 MG tablet Take 100 mg by mouth daily.     lisinopril  (ZESTRIL ) 30 MG tablet TAKE 1/2 OF A TABLET (15 MG TOTAL) BY MOUTH DAILY 45 tablet 3   metFORMIN  (GLUCOPHAGE ) 1000 MG tablet Take 1,000 mg by mouth 2 (two) times daily with a meal.     PARoxetine  (PAXIL ) 10 MG tablet Take 10 mg by mouth daily before breakfast.      rosuvastatin  (CRESTOR ) 20 MG tablet Take 20 mg by mouth daily.     dapagliflozin propanediol (FARXIGA) 10 MG TABS tablet Take 10 mg by mouth daily. Samples     glipiZIDE  (GLUCOTROL  XL) 10 MG 24 hr tablet Take 10 mg by mouth 2 (two) times daily.     No current facility-administered medications for this visit.    Allergies  Allergen Reactions   Erythromycin Nausea And Vomiting and Swelling    Throw up   Zantac [Ranitidine Hcl] Hives, Itching and Swelling    Facial swelling   Adhesive [Tape] Rash    pls try  paper tape   Lipitor [Atorvastatin Calcium ] Other (See Comments)    Muscle cramps in legs     REVIEW OF SYSTEMS:   [X]  denotes positive finding, [ ]  denotes negative finding Cardiac  Comments:  Chest pain or chest pressure:    Shortness of breath upon exertion:    Short of breath when lying flat:    Irregular heart rhythm:  Vascular    Pain in calf, thigh, or hip brought on by ambulation:    Pain in feet at night that wakes you up from your sleep:     Blood clot in your veins:    Leg swelling:         Pulmonary    Oxygen at home:    Productive cough:     Wheezing:         Neurologic    Sudden weakness in arms or legs:     Sudden numbness in arms or legs:     Sudden onset of difficulty speaking or slurred speech:    Temporary loss of vision in one eye:     Problems with dizziness:         Gastrointestinal    Blood in stool:     Vomited blood:         Genitourinary    Burning when urinating:     Blood in urine:        Psychiatric    Major depression:         Hematologic    Bleeding problems:    Problems with blood clotting too easily:        Skin    Rashes or ulcers:        Constitutional    Fever or chills:      PHYSICAL EXAMINATION:  Vitals:   11/02/23 1419 11/02/23 1421  BP: 102/67 (!) 146/70  Pulse: (!) 53   Temp: 98 F (36.7 C)   TempSrc: Temporal   SpO2: 97%   Weight: 100 lb (45.4 kg)     General:  WDWN in NAD; vital signs documented above Gait: Normal HENT: WNL, normocephalic Pulmonary: normal non-labored breathing without wheezing Cardiac: regular HR Abdomen: soft, non tender, non distended Vascular Exam/Pulses: 2+ radial pulses bilaterally, 2+ femoral pulses, 2+ DP pulse, feet warm and well perfused Extremities: without ischemic changes, without Gangrene , without cellulitis; without open wounds;  Musculoskeletal: no muscle wasting or atrophy  Neurologic: A&O X 3 Psychiatric:  The pt has Normal affect.   Non-Invasive  Vascular Imaging:   +-------+-----------+-----------+------------+------------+  ABI/TBIToday's ABIToday's TBIPrevious ABIPrevious TBI  +-------+-----------+-----------+------------+------------+  Right 0.76       0.53       0.91        0.59          +-------+-----------+-----------+------------+------------+  Left  1.04       0.65       1.15        0.82          +-------+-----------+-----------+------------+------------+   Summary:  Right Carotid: Velocities in the right ICA are consistent with a 40-59% stenosis.   Left Carotid: Velocities in the left ICA are consistent with a 1-39% stenosis.   Vertebrals: Right vertebral artery demonstrates antegrade flow. Left vertebral artery demonstrates retrograde flow.  Subclavians: Normal flow hemodynamics were seen in the right subclavian artery. Left monophasic.   ASSESSMENT/PLAN:: 68 y.o. female here for follow up of carotid artery stenosis and PAD. She has remote history of left carotid subclavian bypass by Dr. Timm Foot in 2003. This was for LUE claudication symptoms. She has no history of TIA or stroke. Her LUE claudication has been resolved since her surgery. She has also previously undergone aortobifemoral bypass by Dr. Timm Foot in 2002. She is without any TIA or stroke like symptoms. No upper extremity claudication symptoms. She is without any back or abdominal pain. No lower extremity claudication, rest  pain or tissue loss. - Her ABI today is stable on the left and decreased slightly on the RLE.  - Carotid duplex shows 40-59% on the right ICA, 1-39% on the left ICA. Monophasic flow in the left subclavian - Aspirin and statin - encourage walking regimen - encourage smoking cessation - She will follow up in 6 months with repeat ABI and carotid duplex   Deneen Finical, PA-C [facility] [phone]  Clinic MD:   Vikki Graves

## 2023-11-10 ENCOUNTER — Other Ambulatory Visit: Payer: Self-pay | Admitting: *Deleted

## 2023-11-10 DIAGNOSIS — I6523 Occlusion and stenosis of bilateral carotid arteries: Secondary | ICD-10-CM

## 2023-11-10 DIAGNOSIS — I739 Peripheral vascular disease, unspecified: Secondary | ICD-10-CM

## 2024-04-09 NOTE — Progress Notes (Unsigned)
 Office Note     CC:  follow up Requesting Provider:  Loring Tanda Farrell, *  HPI: Danielle Farrell is a 69 y.o. (August 23, 1955) female who presents for routine follow up of PAD and carotid artery stenosis. She has remote history of left carotid subclavian bypass by Dr. Gerlean in 2003. This was for LUE claudication symptoms. She has also previously undergone aortobifemoral bypass by Dr. Gerlean in 2002. She has no history of TIA or stroke.   At her last visit she was doing very well. No claudication, rest pain or tissue loss. No new neurological symptoms. Her noninvasive studies had decreased on the right lower extremity so recommended closer interval follow up.   Today she says she have been feeling good. She did trip and fall on a curb recently and she thinks she broke or badly bruised her tail bone. Otherwise she denies any pain in her legs on ambulation or rest. No tissue loss. She denies any visual changes, slurred speech, facial drooping, or unilateral upper or lower extremity weakness or numbness. No trouble with her left upper extremity. No weakness, numbness, or pain. Medically managed on Aspirin and statin. Current smoker, 1/2 ppd  Past Medical History:  Diagnosis Date   Bronchitis 07/2012   became pneumonia   Carotid bruit    Coronary artery disease involving left main coronary artery 04/2001   CATH: dLM 50-60%-extending into 50% ostial LCx and 80-90% ostial LAD.  Mid LAD 50-60% and mid RCA 50%.--Referred for CABG x4 (free LIMA-LAD with SVG button, pedicle RIMA-RCA, SVG-D1, SVG-OM1) Dr. 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 Gerlean.   Ao-BiFem wioth L Renal A bypass.   CAROTID-SUBCLAVIAN BYPASS GRAFT Left 08/2001   Dr. Gerlean -> Total occlusion of proximal subclavian up to 1 cm proximal to vertebral artery; subclavian artery transected and endarterectomy performed prior to anastomosis onto the common carotid artery, proximal subclavian stump oversewn   CATARACT EXTRACTION Right 09/2000   CORONARY ARTERY BYPASS GRAFT  05/03/2001   Dr. Dusty; x 4.  (free LIMA-LAD with SVG button, pedicle RIMA-RCA, SVG-D1, SVG-OM1)   LEFT HEART CATH AND CORONARY ANGIOGRAPHY  04/28/2001   Dr. Gerlene Farrell: Class IV angina (across the back) => Abn Cardiolite: dLM 50-60% => into 50% ostial LCx & ostial-prox LAD 80-90%. Mid LAD 50-60%.  Moderate sized D1, OM 1 & OM 2. RCA proximal 50% with there is damping of catheter. Referred for CABG after proximal LAD lesion.   NM MYOVIEW  LTD  10/20/2016   EF 65-70%.  Normal wall motion.  No ischemia or infarction.  LOW RISK   PERIPHERAL VASCULAR CATHETERIZATION  01/2001   Left clavian CTO, 80% left renal artery stenosis and CTO of left CFA with 2-vessel runoff on left side.; R Renal A - FMD w/o stenosis.   TRANSTHORACIC ECHOCARDIOGRAM  06/2013   EF  60-65%. No R WMA. GRII DD. Normal valves    Social History   Socioeconomic History   Marital status: Married    Spouse name: Tanda   Number of children: 0   Years of education: 12   Highest education level: Not on file  Occupational History    Employer: DISABLED    Comment: Disabled  Tobacco Use   Smoking status: Every Day    Current packs/day: 0.50    Average packs/day: 0.5 packs/day for 43.0 years (21.5 ttl pk-yrs)    Types: Cigarettes   Smokeless tobacco: Never  Vaping Use   Vaping status: Never Used  Substance and Sexual Activity   Alcohol use: No     Alcohol/week: 0.0 standard drinks of alcohol   Drug use: No   Sexual activity: Not Currently  Other Topics Concern   Not on file  Social History Narrative   Patient lives at home with her husband Danielle Farrell).   Disabled.   Right handed   Education 12 th grade   Caffeine- One cup of coffee daily.      Not interested in smoking cessation.   Social Drivers of Corporate investment banker Strain: Not on file  Food Insecurity: Not on file  Transportation Needs: Not on file  Physical Activity: Not on file  Stress: Not on file  Social Connections: Not on file  Intimate Partner Violence: Not on file    Family History  Problem Relation Age of Onset   Cancer Mother        Danielle Farrell   Prostate cancer Father    Heart attack Paternal Grandfather    Heart attack Brother 75       Cardiac Infraction   Breast cancer Neg Hx     Current Outpatient Medications  Medication Sig Dispense Refill   aspirin 81 MG EC tablet Take 81 mg by mouth daily after supper.     dapagliflozin propanediol (FARXIGA) 10 MG TABS tablet Take 10 mg by mouth daily. Samples     glipiZIDE  (GLUCOTROL  XL) 10 MG 24 hr tablet Take 10 mg by mouth 2 (two) times daily.     JANUVIA 100 MG tablet Take 100 mg by mouth daily.     lisinopril  (ZESTRIL ) 30 MG tablet TAKE 1/2 OF A TABLET (15 MG TOTAL) BY MOUTH DAILY 45 tablet 3   metFORMIN  (GLUCOPHAGE ) 1000 MG tablet Take 1,000 mg by mouth 2 (two) times daily with a meal.     PARoxetine  (PAXIL ) 10 MG tablet Take 10 mg by mouth daily before breakfast.      rosuvastatin  (CRESTOR ) 20 MG tablet Take 20 mg by mouth daily.     No current facility-administered medications for this visit.    Allergies  Allergen Reactions   Erythromycin Nausea And Vomiting and Swelling    Throw up   Zantac [Ranitidine Hcl] Hives, Itching and Swelling    Facial swelling   Adhesive [Tape] Rash    pls try paper tape   Lipitor [Atorvastatin Calcium ] Other (See Comments)    Muscle cramps in legs      REVIEW OF SYSTEMS:  Negative unless noted in HPI [X]  denotes positive finding, [ ]  denotes negative finding Cardiac  Comments:  Chest pain or chest pressure:    Shortness of breath upon exertion:    Short of breath when lying flat:    Irregular heart rhythm:        Vascular    Pain in calf, thigh, or hip brought on  by ambulation:    Pain in feet at night that wakes you up from your sleep:     Blood clot in your veins:    Leg swelling:         Pulmonary    Oxygen at home:    Productive cough:     Wheezing:         Neurologic    Sudden weakness in arms or legs:     Sudden numbness in arms or legs:     Sudden onset of difficulty speaking or slurred speech:    Temporary loss of vision in one eye:     Problems with dizziness:         Gastrointestinal    Blood in stool:     Vomited blood:         Genitourinary    Burning when urinating:     Blood in urine:        Psychiatric    Major depression:         Hematologic    Bleeding problems:    Problems with blood clotting too easily:        Skin    Rashes or ulcers:        Constitutional    Fever or chills:      PHYSICAL EXAMINATION:  Vitals:   04/11/24 0836 04/11/24 0839  BP: 131/74 99/66  Pulse:    Temp:       General:  WDWN in NAD; vital signs documented above Gait: Normal HENT: WNL, normocephalic Pulmonary: normal non-labored breathing Cardiac: regular HR; right carotid bruit Abdomen: soft Vascular Exam/Pulses: 2+ radial pulses, 2+ left DP, right PT 1+. Feet warm and well perfused Extremities: without ischemic changes, without Gangrene , without cellulitis; without open wounds;  Musculoskeletal: no muscle wasting or atrophy  Neurologic: A&O X 3  Psychiatric:  The pt has Normal affect.   Non-Invasive Vascular Imaging:   VAS US  Carotid: Summary:  Right Carotid: Velocities in the right ICA are consistent with a 40-59% stenosis.   Left Carotid: Velocities in the left ICA are consistent with  a 1-39% stenosis.   Vertebrals: Right vertebral artery demonstrates antegrade flow. Left vertebral artery demonstrates retrograde flow.  Subclavians: Normal flow hemodynamics were seen in the right subclavian artery. Left monophasic.   +-------+-----------+-----------+------------+------------+  ABI/TBIToday's ABIToday's TBIPrevious ABIPrevious TBI  +-------+-----------+-----------+------------+------------+  Right 0.76       0.57       0.76        0.53          +-------+-----------+-----------+------------+------------+  Left  1.03       0.62       1.04        0.65          +-------+-----------+-----------+------------+------------+    ASSESSMENT/PLAN:: 68 y.o. female here for follow up of PAD and carotid artery stenosis. She has remote history of left carotid subclavian bypass by Dr. Gerlean in 2003. This was for LUE claudication symptoms. She has also previously undergone aortobifemoral bypass by Dr. Gerlean in 2002. She has no history of TIA or stroke. She has no associated neurological symptoms. She is without any claudication, rest pain or tissue loss.  - carotid duplex is unchanged from prior duplex 6 months ago - ABI is stable bilaterally - encourage smoking cessation - encourage dedicated walking regimen   - Continue Aspirin and Statin - I will have her return for follow up in 1 year with ABI and carotid duplex  Teretha Damme, PA-C [facility] [phone]  Clinic MD:   Sheree

## 2024-04-11 ENCOUNTER — Ambulatory Visit (HOSPITAL_COMMUNITY)
Admission: RE | Admit: 2024-04-11 | Discharge: 2024-04-11 | Attending: Physician Assistant | Admitting: Physician Assistant

## 2024-04-11 ENCOUNTER — Ambulatory Visit (HOSPITAL_COMMUNITY)
Admission: RE | Admit: 2024-04-11 | Discharge: 2024-04-11 | Disposition: A | Discharge status: Home / Self Care | Source: Ambulatory Visit | Attending: Vascular Surgery | Admitting: Vascular Surgery

## 2024-04-11 ENCOUNTER — Ambulatory Visit (INDEPENDENT_AMBULATORY_CARE_PROVIDER_SITE_OTHER): Admitting: Physician Assistant

## 2024-04-11 ENCOUNTER — Encounter: Payer: Self-pay | Admitting: Physician Assistant

## 2024-04-11 VITALS — BP 99/66 | HR 79 | Temp 97.9°F | Wt 99.5 lb

## 2024-04-11 DIAGNOSIS — I6523 Occlusion and stenosis of bilateral carotid arteries: Secondary | ICD-10-CM | POA: Insufficient documentation

## 2024-04-11 DIAGNOSIS — I739 Peripheral vascular disease, unspecified: Secondary | ICD-10-CM

## 2024-04-11 LAB — VAS US ABI WITH/WO TBI
Left ABI: 1.03
Right ABI: 0.76

## 2024-07-20 ENCOUNTER — Other Ambulatory Visit: Payer: Self-pay | Admitting: Cardiology

## 2024-07-23 NOTE — Telephone Encounter (Signed)
 In accordance with refill protocols, please review and address the following requirements before this medication refill can be authorized:  Labs

## 2024-07-24 NOTE — Telephone Encounter (Signed)
 Patient had not been seen in a year and a half.  Needs a follow-up appointment. SABRA Has been on 50 mg lisinopril  for a long time.  Okay to refill.

## 2024-07-25 ENCOUNTER — Other Ambulatory Visit: Payer: Self-pay

## 2024-07-25 NOTE — Telephone Encounter (Signed)
 At last Lab in Care Everywhere on 12/23/285, Labs were out of Normal Range. Called Pt and left Msg to call back to schedule a F/U appt with Dr. Anner. In Dr. Genice response to me he stated Pt was on Lisinopril  50 mg.  In accordance with refill protocols, please review and address the following requirements before this medication refill can be authorized:  Labs

## 2024-08-01 NOTE — Telephone Encounter (Signed)
 Renal Function Panel was done on 06/26/24.
